# Patient Record
Sex: Male | Born: 1970
Health system: Southern US, Community
[De-identification: ages and names within clinical notes are randomized; demographics above are authoritative.]

## PROBLEM LIST (undated history)

## (undated) DIAGNOSIS — R011 Cardiac murmur, unspecified: Secondary | ICD-10-CM

## (undated) DIAGNOSIS — I10 Essential (primary) hypertension: Secondary | ICD-10-CM

## (undated) DIAGNOSIS — F32A Depression, unspecified: Secondary | ICD-10-CM

## (undated) DIAGNOSIS — G4730 Sleep apnea, unspecified: Secondary | ICD-10-CM

## (undated) DIAGNOSIS — E1110 Type 2 diabetes mellitus with ketoacidosis without coma: Secondary | ICD-10-CM

## (undated) DIAGNOSIS — U071 COVID-19: Secondary | ICD-10-CM

## (undated) DIAGNOSIS — F329 Major depressive disorder, single episode, unspecified: Secondary | ICD-10-CM

## (undated) DIAGNOSIS — E119 Type 2 diabetes mellitus without complications: Secondary | ICD-10-CM

## (undated) DIAGNOSIS — F419 Anxiety disorder, unspecified: Secondary | ICD-10-CM

## (undated) HISTORY — DX: Essential (primary) hypertension: I10

## (undated) HISTORY — DX: Type 2 diabetes mellitus without complications: E11.9

## (undated) HISTORY — DX: Type 2 diabetes mellitus with ketoacidosis without coma: E11.10

## (undated) HISTORY — DX: Anxiety disorder, unspecified: F41.9

## (undated) HISTORY — DX: Depression, unspecified: F32.A

## (undated) HISTORY — DX: Major depressive disorder, single episode, unspecified: F32.9

## (undated) HISTORY — DX: Sleep apnea, unspecified: G47.30

## (undated) HISTORY — PX: OTHER SURGICAL HISTORY: SHX169

## (undated) HISTORY — DX: COVID-19: U07.1

## (undated) HISTORY — DX: Cardiac murmur, unspecified: R01.1

---

## 2005-11-14 ENCOUNTER — Ambulatory Visit: Payer: Self-pay | Admitting: Family Medicine

## 2006-07-14 ENCOUNTER — Emergency Department: Payer: Self-pay | Admitting: Unknown Physician Specialty

## 2008-04-28 ENCOUNTER — Emergency Department: Payer: Self-pay | Admitting: Emergency Medicine

## 2009-01-08 ENCOUNTER — Emergency Department: Payer: Self-pay | Admitting: Emergency Medicine

## 2010-07-21 ENCOUNTER — Ambulatory Visit: Payer: Self-pay

## 2012-05-18 ENCOUNTER — Emergency Department: Payer: Self-pay | Admitting: Unknown Physician Specialty

## 2012-05-18 LAB — COMPREHENSIVE METABOLIC PANEL
Albumin: 4.1 g/dL (ref 3.4–5.0)
Alkaline Phosphatase: 61 U/L (ref 50–136)
Anion Gap: 8 (ref 7–16)
BUN: 15 mg/dL (ref 7–18)
Calcium, Total: 9 mg/dL (ref 8.5–10.1)
Glucose: 599 mg/dL (ref 65–99)
Potassium: 3.7 mmol/L (ref 3.5–5.1)
SGOT(AST): 22 U/L (ref 15–37)
Sodium: 129 mmol/L — ABNORMAL LOW (ref 136–145)
Total Protein: 8 g/dL (ref 6.4–8.2)

## 2012-05-18 LAB — URINALYSIS, COMPLETE
Bilirubin,UR: NEGATIVE
Glucose,UR: >=500 mg/dL (ref 0–75)
Nitrite: NEGATIVE
Ph: 6 (ref 4.5–8.0)
Protein: NEGATIVE
RBC,UR: <1 /HPF (ref 0–5)
WBC UR: <1 /HPF (ref 0–5)

## 2012-05-18 LAB — PLATELET COUNT: Platelet: 159 10*3/uL (ref 150–440)

## 2012-05-18 LAB — CBC
HCT: 41.5 % (ref 40.0–52.0)
HGB: 14.5 g/dL (ref 13.0–18.0)
MCH: 31.2 pg (ref 26.0–34.0)
RBC: 4.66 10*6/uL (ref 4.40–5.90)

## 2012-05-18 LAB — TROPONIN I: Troponin-I: <0.02 ng/mL

## 2012-05-18 LAB — MAGNESIUM: Magnesium: 1.8 mg/dL

## 2012-05-19 ENCOUNTER — Emergency Department: Payer: Self-pay | Admitting: Emergency Medicine

## 2012-05-19 LAB — CBC
HCT: 38.2 % — ABNORMAL LOW (ref 40.0–52.0)
HGB: 13.2 g/dL (ref 13.0–18.0)
MCH: 31.1 pg (ref 26.0–34.0)
MCHC: 34.6 g/dL (ref 32.0–36.0)
Platelet: 121 10*3/uL — ABNORMAL LOW (ref 150–440)
RBC: 4.25 10*6/uL — ABNORMAL LOW (ref 4.40–5.90)

## 2012-05-19 LAB — COMPREHENSIVE METABOLIC PANEL
Anion Gap: 11 (ref 7–16)
Calcium, Total: 8.6 mg/dL (ref 8.5–10.1)
Chloride: 100 mmol/L (ref 98–107)
EGFR (African American): >60
EGFR (Non-African Amer.): >60
Osmolality: 292 (ref 275–301)
Potassium: 3.5 mmol/L (ref 3.5–5.1)
SGOT(AST): 26 U/L (ref 15–37)
Sodium: 134 mmol/L — ABNORMAL LOW (ref 136–145)

## 2012-05-19 LAB — URINALYSIS, COMPLETE
Bacteria: NONE SEEN
Bilirubin,UR: NEGATIVE
Glucose,UR: >=500 mg/dL (ref 0–75)
Ph: 6 (ref 4.5–8.0)
RBC,UR: <1 /HPF (ref 0–5)
Squamous Epithelial: <1
WBC UR: <1 /HPF (ref 0–5)

## 2012-06-10 ENCOUNTER — Ambulatory Visit: Payer: Self-pay | Admitting: Family Medicine

## 2012-06-29 ENCOUNTER — Ambulatory Visit: Payer: Self-pay | Admitting: Family Medicine

## 2013-02-07 LAB — HEMOGLOBIN A1C: Hgb A1c MFr Bld: 12.9 % — AB (ref 4.0–6.0)

## 2013-06-14 LAB — HEPATIC FUNCTION PANEL
ALK PHOS: 63 U/L (ref 25–125)
ALT: 45 U/L — AB (ref 10–40)
AST: 29 U/L (ref 14–40)
BILIRUBIN, TOTAL: 0.3 mg/dL

## 2013-06-14 LAB — BASIC METABOLIC PANEL
BUN: 17 mg/dL (ref 4–21)
CREATININE: 1.1 mg/dL (ref 0.6–1.3)
Glucose: 370 mg/dL
POTASSIUM: 3.8 mmol/L (ref 3.4–5.3)
SODIUM: 136 mmol/L — AB (ref 137–147)

## 2013-06-14 LAB — HEMOGLOBIN A1C: Hgb A1c MFr Bld: 12.4 % — AB (ref 4.0–6.0)

## 2013-08-21 LAB — HEPATIC FUNCTION PANEL
ALK PHOS: 51 U/L (ref 25–125)
AST: 65 U/L — AB (ref 14–40)

## 2013-08-21 LAB — LIPID PANEL
CHOLESTEROL: 198 mg/dL (ref 0–200)
CHOLESTEROL: 198 mg/dL (ref 0–200)
HDL: 23 mg/dL — AB (ref 35–70)
Triglycerides: 999 mg/dL — AB (ref 40–160)

## 2013-08-21 LAB — BASIC METABOLIC PANEL
BUN: 18 mg/dL (ref 4–21)
Creatinine: 1 mg/dL (ref 0.6–1.3)
Glucose: 267 mg/dL
Potassium: 4.1 mmol/L (ref 3.4–5.3)
Sodium: 140 mmol/L (ref 137–147)

## 2013-08-21 LAB — HEMOGLOBIN A1C: HEMOGLOBIN A1C: 11.9 % — AB (ref 4.0–6.0)

## 2013-09-07 ENCOUNTER — Ambulatory Visit: Payer: BC Managed Care – PPO | Admitting: Endocrinology

## 2013-09-07 DIAGNOSIS — Z0289 Encounter for other administrative examinations: Secondary | ICD-10-CM

## 2014-04-21 LAB — BASIC METABOLIC PANEL
BUN: 11 mg/dL (ref 4–21)
Creatinine: 0.8 mg/dL (ref 0.6–1.3)
Glucose: 259 mg/dL
Potassium: 3.9 mmol/L (ref 3.4–5.3)
Sodium: 135 mmol/L — AB (ref 137–147)

## 2014-04-21 LAB — HEPATIC FUNCTION PANEL
ALT: 18 U/L (ref 10–40)
AST: 17 U/L (ref 14–40)
Alkaline Phosphatase: 57 U/L (ref 25–125)
Bilirubin, Total: 0.5 mg/dL

## 2014-04-21 LAB — LIPID PANEL
CHOLESTEROL: 184 mg/dL (ref 0–200)
HDL: 37 mg/dL (ref 35–70)
TRIGLYCERIDES: 481 mg/dL — AB (ref 40–160)

## 2014-05-02 ENCOUNTER — Ambulatory Visit (INDEPENDENT_AMBULATORY_CARE_PROVIDER_SITE_OTHER): Payer: BC Managed Care – PPO | Admitting: Endocrinology

## 2014-05-02 ENCOUNTER — Encounter: Payer: Self-pay | Admitting: *Deleted

## 2014-05-02 ENCOUNTER — Encounter: Payer: Self-pay | Admitting: Endocrinology

## 2014-05-02 VITALS — BP 118/82 | HR 80 | Ht 73.25 in | Wt 190.5 lb

## 2014-05-02 DIAGNOSIS — E1169 Type 2 diabetes mellitus with other specified complication: Secondary | ICD-10-CM | POA: Insufficient documentation

## 2014-05-02 DIAGNOSIS — E785 Hyperlipidemia, unspecified: Secondary | ICD-10-CM | POA: Insufficient documentation

## 2014-05-02 DIAGNOSIS — E119 Type 2 diabetes mellitus without complications: Secondary | ICD-10-CM

## 2014-05-02 DIAGNOSIS — I340 Nonrheumatic mitral (valve) insufficiency: Secondary | ICD-10-CM | POA: Insufficient documentation

## 2014-05-02 DIAGNOSIS — I10 Essential (primary) hypertension: Secondary | ICD-10-CM | POA: Insufficient documentation

## 2014-05-02 DIAGNOSIS — E1165 Type 2 diabetes mellitus with hyperglycemia: Secondary | ICD-10-CM | POA: Insufficient documentation

## 2014-05-02 HISTORY — DX: Essential (primary) hypertension: I10

## 2014-05-02 LAB — HM DIABETES FOOT EXAM: HM Diabetic Foot Exam: NORMAL

## 2014-05-02 MED ORDER — INSULIN GLARGINE 100 UNIT/ML SOLOSTAR PEN: 40.0000 [IU] | PEN_INJECTOR | Freq: Every day | SUBCUTANEOUS | Status: DC

## 2014-05-02 MED ORDER — GLUCOSE BLOOD VI STRP: ORAL_STRIP | Status: DC

## 2014-05-02 MED ORDER — INSULIN PEN NEEDLE 32G X 4 MM MISC: Status: DC

## 2014-05-02 MED ORDER — ONETOUCH DELICA LANCETS 33G MISC: Status: DC

## 2014-05-02 MED ORDER — INSULIN ASPART 100 UNIT/ML FLEXPEN: 15.0000 [IU] | PEN_INJECTOR | Freq: Three times a day (TID) | SUBCUTANEOUS | Status: DC

## 2014-05-02 NOTE — Assessment & Plan Note (Signed)
Blood pressure is at target today on current regimen. Recent urine microalbumin positive in October 2015 in the setting of diabetes mellitus.will reassess at future visits once glycemic control is better.   

## 2014-05-02 NOTE — Assessment & Plan Note (Signed)
Recent triglyceride levels were elevated in October 2015.expect improvement with better sugar control. Patient has prior history of noncompliance.

## 2014-05-02 NOTE — Progress Notes (Signed)
Reason for visit-  Craig KingfisherCalvin Johnson is a 43 y.o.-year-old male, referred by his PCP, Craig Johnson, for management of Type 2 diabetes, uncontrolled, with complications ( ? Stage 2 CKD, prior hx DKA).   HPI- Patient has been diagnosed with diabetes in 2013. Recalls being initially on lifestyle modifications.  Tried  Metformin,  Januvia, Farxiga in the past. Then was transitioned to basal/bolus insulin. Has been non compliant with medications and follow up. It appears from the office notes that he has been dismissed from their practice for non compliance in October 2015.  Prior therapies-  Tried metformin "but didn't work".  Did try Januvia -"didn't work". Marcelline DeistFarxiga was given - took for 6 months. Then ran out of RF and missed appts.    Pt is currently on a regimen of: - Lantus 60 units qhs- hasn't taken in over a month as out of medication - Novolog 20 units qac once daily every morning   Last hemoglobin A1c was: Lab Results  Component Value Date   HGBA1C 11.9* 08/21/2013   HGBA1C 12.4* 06/14/2013   HGBA1C 12.9* 02/07/2013     Pt checks his sugars 1 time a day . Uses  A glucometer (? Brand) . By recall they are:  PREMEAL Breakfast Lunch Dinner Bedtime Overall  Glucose range: 300s      Mean/median:        POST-MEAL PC Breakfast PC Lunch PC Dinner  Glucose range:     Mean/median:       Hypoglycemia-  No lows. he has hypoglycemia awareness at normal sugars in the 100s and reports that this happened a few times when he was using lantus at 60 units daily.   Dietary habits- eats 2 times daily. Skips lunch some times. Eats out at fast food joints often. Doesn't watch carbs. Drinks diet drinks.   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  Weight - trending down per his report with symptoms of polyuria and polydipsia Wt Readings from Last 3 Encounters:  05/02/14 190 lb 8 oz (86.41 kg)    Diabetes Complications-  Nephropathy- No, ? Stage 2    CKD, last BUN/creatinine- GFR 105 Lab Results  Component Value Date   BUN 11 04/21/2014   CREATININE 0.8 04/21/2014  Elevated urine MA results Dec 2014, October 2015  Retinopathy- No, Last DEE was  3 weeks ago in October 2015. Has glaucoma.  Neuropathy- occasional pins and needles sensation in his feet. No known neuropathy.  Associated history - No CAD . No prior stroke. No hypothyroidism. his last TSH was No results found for: TSH  Hyperlipidemia-  his last set of lipids were- Currently on dietary therapy. Tolerating well.   Lab Results  Component Value Date   CHOL 184 04/21/2014   HDL 37 04/21/2014   TRIG 481* 04/21/2014    Blood Pressure/HTN- Patient's blood pressure is well controlled today on current regimen that includes ACE-I.Marland Kitchen.  Pt has FH of DM in Father and Brother.  I have reviewed the patient's past medical history, family and social history, surgical history, medications and allergies.   Past Medical History  Diagnosis Date  . Diabetes mellitus without complication     Type 2  . Hypertension   . DKA (diabetic ketoacidoses)   . Anxiety   . Depression   . Sleep apnea    No past surgical history on file. History   Social History  . Marital Status: Married    Spouse Name: N/A  Number of Children: N/A  . Years of Education: N/A   Occupational History  . Not on file.   Social History Main Topics  . Smoking status: Never Smoker   . Smokeless tobacco: Never Used  . Alcohol Use: Yes  . Drug Use: No  . Sexual Activity: Not on file   Other Topics Concern  . Not on file   Social History Narrative  . No narrative on file   No current outpatient prescriptions on file prior to visit.   No current facility-administered medications on file prior to visit.   No Known Allergies Family History  Problem Relation Age of Onset  . Diabetes Father   . Diabetes Brother      Review of Systems: [x]  complains of  [  ] denies General:   [ x ] Recent weight  change [  ] Fatigue  [  ] Loss of appetite Eyes: [  ]  Vision Difficulty [  ]  Eye pain ENT: [  ]  Hearing difficulty [  ]  Difficulty Swallowing CVS: [  ] Chest pain [  ]  Palpitations/Irregular Heart beat [  ]  Shortness of breath lying flat [  ] Swelling of legs Resp: [  ] Frequent Cough [  ] Shortness of Breath  [  ]  Wheezing GI: [  ] Heartburn  [  ] Nausea or Vomiting  [  ] Diarrhea [  ] Constipation  [ x ] Abdominal Pain GU: [  ]  Polyuria  [ x ]  nocturia Bones/joints:  [  ]  Muscle aches  [  ] Joint Pain  [  ] Bone pain Skin/Hair/Nails: [  ]  Rash  [  ] New stretch marks [  ]  Itching [  ] Hair loss [  ]  Excessive hair growth Reproduction: [  ] Low sexual desire , [  ]  Women: Menstrual cycle problems [  ]  Women: Breast Discharge [  ] Men: Difficulty with erections [  ]  Men: Enlarged Breasts CNS: [  ] Frequent Headaches [  ] Blurry vision [  ] Tremors [  ] Seizures [  ] Loss of consciousness [  ] Localized weakness Endocrine: [ x ]  Excess thirst [  ]  Feeling excessively hot [  ]  Feeling excessively cold Heme: [  ]  Easy bruising [  ]  Enlarged glands or lumps in neck Allergy: [  ]  Food allergies [  ] Environmental allergies  PE: BP 118/82 mmHg  Pulse 80  Ht 6' 1.25" (1.861 m)  Wt 190 lb 8 oz (86.41 kg)  BMI 24.95 kg/m2  SpO2 98% Wt Readings from Last 3 Encounters:  05/02/14 190 lb 8 oz (86.41 kg)   GENERAL: No acute distress, well developed HEENT:  Eye exam shows normal external appearance. Oral exam shows normal mucosa .  NECK:   Neck exam shows no lymphadenopathy. No Carotids bruits. Thyroid is not enlarged and no nodules felt.  no acanthosis nigricans LUNGS:         Chest is symmetrical. Lungs are clear to auscultation.Marland Kitchen   HEART:         Heart sounds:  S1 and S2 are normal. Systolic murmur heard with some radiation to carotids. ABDOMEN:  No Distention present. Liver and spleen are not palpable. No other mass or tenderness present.  EXTREMITIES:     There is no edema.  2+ DP pulses  NEUROLOGICAL:     Grossly intact.            Diabetic foot exam done with shoes and socks removed: Normal Monofilament testing bilaterally. No deformities of toes.  Nails  Not dystrophic. Skin normal color. No open wounds. Dry skin.  MUSCULOSKELETAL:       There is no enlargement or gross deformity of the joints.  SKIN:       No rash  ASSESSMENT AND PLAN: Problem List Items Addressed This Visit      Cardiovascular and Mediastinum   Essential hypertension    Blood pressure is at target today on current regimen. Recent urine microalbumin positive in October 2015 in the setting of diabetes mellitus.will reassess at future visits once glycemic control is better.    Relevant Medications      amLODipine (NORVASC) 10 MG tablet      lisinopril-hydrochlorothiazide (PRINZIDE,ZESTORETIC) 20-25 MG per tablet     Endocrine   Type 2 diabetes mellitus not at goal - Primary    He states that his recent A1c is elevated to the 13% range. We discussed at length regarding importance of better glycemic control and goalA1c of close to 7%.discussed at length regarding the long-term risks of uncontrolled diabetes mellitus. The patient has been discharged from his primary care's practice due to noncompliance, and this was found out after review of the referral notes sent after the clinic visit. He will need to establish care with new primary care provider.  He seems motivated at this time with the recommendations that we discussed in clinic today.  Discussed dietary changes and modifications. Given him "nutrition in the fast lane" booklet so that he could make better choices. He will check his sugars 4 times a day. One Touch mini meter given with the testing supplies. He will record his sugars in a log and bring it to the next appointment in 2 weeks.  Discussed at length about basal bolus insulin regimen, and he will start Lantus 40 units a day at bedtime and take NovoLog 15 units with each meal. He  would likely need a higher dose based on his prior report, however, I would like to see his blood sugar report first in order to make these adjustments.  Foot care and eye exams. Discussed.  RTC 2 weeks.    Relevant Medications      lisinopril-hydrochlorothiazide (PRINZIDE,ZESTORETIC) 20-25 MG per tablet      insulin aspart (NOVOLOG FLEXPEN) 100 UNIT/ML FlexPen      LANTUS SOLOSTAR 100 UNIT/ML  SOLN     Other   Hyperlipidemia    Recent triglyceride levels were elevated in October 2015.expect improvement with better sugar control. Patient has prior history of noncompliance.    Relevant Medications      amLODipine (NORVASC) 10 MG tablet      lisinopril-hydrochlorothiazide (PRINZIDE,ZESTORETIC) 20-25 MG per tablet        - Return to clinic in 2 weeks with sugar log/meter.  Raydel [facility] 05/02/2014 11:35 AM

## 2014-05-02 NOTE — Assessment & Plan Note (Signed)
He states that his recent A1c is elevated to the 13% range. We discussed at length regarding importance of better glycemic control and goalA1c of close to 7%.discussed at length regarding the long-term risks of uncontrolled diabetes mellitus. The patient has been discharged from his primary care's practice due to noncompliance, and this was found out after review of the referral notes sent after the clinic visit. He will need to establish care with new primary care provider.  He seems motivated at this time with the recommendations that we discussed in clinic today.  Discussed dietary changes and modifications. Given him "nutrition in the fast lane" booklet so that he could make better choices. He will check his sugars 4 times a day. One Touch mini meter given with the testing supplies. He will record his sugars in a log and bring it to the next appointment in 2 weeks.  Discussed at length about basal bolus insulin regimen, and he will start Lantus 40 units a day at bedtime and take NovoLog 15 units with each meal. He would likely need a higher dose based on his prior report, however, I would like to see his blood sugar report first in order to make these adjustments.  Foot care and eye exams. Discussed.  RTC 2 weeks.

## 2014-05-02 NOTE — Patient Instructions (Signed)
Work on better [employer]ation[employer]ietary choices.   Check sugars 4 x daily ( before each meal and at bedtime).  Record them in a log book and bring that/meter to next appointment.   Change Novolog to 15 units prior to each meal. Dont take that particular shot of Novolog if you are going to skip a meal.   Start lantus 40 units at night time daily.   Please come back for a follow-up appointment in 2 weeks

## 2014-05-02 NOTE — Progress Notes (Signed)
Pre visit review using our clinic review tool, if applicable. No additional management support is needed unless otherwise documented below in the visit note. 

## 2014-05-16 ENCOUNTER — Ambulatory Visit: Payer: BC Managed Care – PPO | Admitting: Endocrinology

## 2014-05-16 ENCOUNTER — Ambulatory Visit (INDEPENDENT_AMBULATORY_CARE_PROVIDER_SITE_OTHER): Payer: BC Managed Care – PPO | Admitting: Endocrinology

## 2014-05-16 ENCOUNTER — Encounter: Payer: Self-pay | Admitting: Endocrinology

## 2014-05-16 VITALS — BP 122/80 | HR 77 | Wt 191.5 lb

## 2014-05-16 DIAGNOSIS — E785 Hyperlipidemia, unspecified: Secondary | ICD-10-CM

## 2014-05-16 DIAGNOSIS — I10 Essential (primary) hypertension: Secondary | ICD-10-CM

## 2014-05-16 DIAGNOSIS — E119 Type 2 diabetes mellitus without complications: Secondary | ICD-10-CM

## 2014-05-16 LAB — HEMOGLOBIN A1C: Hgb A1c MFr Bld: 12.3 % — ABNORMAL HIGH (ref 4.6–6.5)

## 2014-05-16 MED ORDER — FENOFIBRATE 54 MG PO TABS: 54.0000 mg | ORAL_TABLET | Freq: Every day | ORAL | Status: DC

## 2014-05-16 NOTE — Patient Instructions (Signed)
Check sugars 4 x daily ( before each meal and at bedtime).  Record them in a log book and bring that/meter to next appointment.   Change lantus to 42 units daily at bedtime.  Change Novolog to 15+1 scale detailed below.  Avoid alcohol.  Start fenofibrate therapy for elevated Tgs.  Labs today for A1c.  Please come back for a follow-up appointment in 1 month.   Blood Glucose (mg/dL)  Breakfast  (Units Novolog Insulin)  Lunch  (Units Novolog Insulin)  Supper  (Units Novolog Insulin)  Nighttime (Units Novolog Insulin)   <70 Treat the low blood sugar.  Recheck blood glucose  in 15 mins. If sugar is more than 70, then take the number of units of insulin in the 70-90 row, if before a meal.   70-90   9   9   9   0  91-130 (Base Dose)  15  15  15   0   131-150  16  16  16   0   151-200  17  17  17   0   201-250  18  18  18  0   251-300  19  19  19 1    301-350  20  20  20 2    351-400  21  21  21  3    401-450  22  22  22 4    >450  23  23  23  5    Lantus insulin 42 units subcutaneous Nightly

## 2014-05-16 NOTE — Assessment & Plan Note (Signed)
Blood pressure is at target today on current regimen. Recent urine microalbumin positive in October 2015 in the setting of diabetes mellitus.will reassess at future visits once glycemic control is better.

## 2014-05-16 NOTE — Progress Notes (Signed)
Pre visit review using our clinic review tool, if applicable. No additional management support is needed unless otherwise documented below in the visit note. 

## 2014-05-16 NOTE — Progress Notes (Signed)
Reason for visit-  Craig Johnson is a 43 y.o.-year-old male, here for follow up management of Type 2 diabetes, uncontrolled, with complications ( ? Stage 2 CKD, prior hx DKA). Last seen by me 2 weeks ago. Needs a new PCP.    HPI- Patient has been diagnosed with diabetes in 2013. Recalls being initially on lifestyle modifications.  Tried  Metformin,  Januvia, Farxiga in the past. Then was transitioned to basal/bolus insulin. Has been non compliant with medications and follow up. Dismissed from the [facility] practice for non compliance in October 2015.  Prior therapies-  Tried metformin "but didn't work".  Did try Januvia -"didn't work". Marcelline DeistFarxiga was given - took for 6 months. Then ran out of RF and missed appts.    Pt is currently on a regimen of: - Lantus 40 units qhs-misses about 40% time as he sleeps earlier at night time. - Novolog 15 units qac - missing lunch time shot still as often skips lunch   Last hemoglobin A1c was: Lab Results  Component Value Date   HGBA1C 11.9* 08/21/2013   HGBA1C 12.4* 06/14/2013   HGBA1C 12.9* 02/07/2013     Pt checks his sugars 3-4 times a day . Uses One Touch mini glucometer. By meter download they are:  PREMEAL Breakfast Lunch Dinner Bedtime Overall  Glucose range: 145-227 86-312 241-326 223-354   Mean/median:        POST-MEAL PC Breakfast PC Lunch PC Dinner  Glucose range:     Mean/median:       Hypoglycemia-  No lows. he has hypoglycemia awareness at normal sugars in the 100s and had one recent low recently.   Dietary habits- eats 2 times daily. Skips lunch some times. Eats out at fast food joints often. Has been eating more vegetables recently, starting to watch carbs. Drinks diet drinks.   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  Weight - better symptoms recently, weight stable  Wt Readings from Last 3 Encounters:  05/16/14 191 lb 8 oz (86.864 kg)  05/02/14 190 lb 8 oz (86.41 kg)     Diabetes Complications-  Nephropathy- No, ? Stage 2   CKD, last BUN/creatinine- GFR 105 Lab Results  Component Value Date   BUN 11 04/21/2014   CREATININE 0.8 04/21/2014  Elevated urine MA results Dec 2014, October 2015  Retinopathy- No, Last DEE was  in October 2015. Has glaucoma.  Neuropathy- occasional pins and needles sensation in his feet. No known neuropathy.  Associated history - No CAD . No prior stroke. No hypothyroidism. his last TSH was No results found for: TSH  Hyperlipidemia-  his last set of lipids were- Currently on dietary therapy. Tolerating well.   Lab Results  Component Value Date   CHOL 184 04/21/2014   HDL 37 04/21/2014   TRIG 481* 04/21/2014    Blood Pressure/HTN- Patient's blood pressure is well controlled today on current regimen that includes ACE-I..   I have reviewed the patient's past medical history, medications and allergies.      Current Outpatient Prescriptions on File Prior to Visit  Medication Sig Dispense Refill  . amLODipine (NORVASC) 10 MG tablet Take 10 mg by mouth daily.     Marland Kitchen. glucose blood (ONE TOUCH ULTRA TEST) test strip Use to test fingerstick glucose 4 times daily 120 each 12  . insulin aspart (NOVOLOG FLEXPEN) 100 UNIT/ML FlexPen Inject 15 Units into the skin 3 (three) times daily with meals. 15 mL 3  .  Insulin Glargine (LANTUS SOLOSTAR) 100 UNIT/ML Solostar Pen Inject 40 Units into the skin daily at 10 pm. 5 pen 4  . Insulin Pen Needle (BD PEN NEEDLE NANO U/F) 32G X 4 MM MISC Use for insulin injections 4 times daily. 120 each 11  . lisinopril-hydrochlorothiazide (PRINZIDE,ZESTORETIC) 20-25 MG per tablet Take 1 tablet by mouth daily.   0  . ONETOUCH DELICA LANCETS 33G MISC Use for checking fingerstick glucose 4 times daily 120 each 11   No current facility-administered medications on file prior to visit.   No Known Allergies    Review of Systems- [ x ]  Complains of    [  ]  denies [  ] Recent weight change [  ]  Fatigue  [  ] polydipsia [  ] polyuria [  ]  nocturia [  ]  vision difficulty [  ] chest pain [  ] shortness of breath [  ] leg swelling [  ] cough [  ] nausea/vomiting [  ] diarrhea [  ] constipation [  ] abdominal pain [  ]  tingling/numbness in extremities [  ]  concern with feet ( wounds/sores)   PE: BP 122/80 mmHg  Pulse 77  Wt 191 lb 8 oz (86.864 kg)  SpO2 97% Wt Readings from Last 3 Encounters:  05/16/14 191 lb 8 oz (86.864 kg)  05/02/14 190 lb 8 oz (86.41 kg)   Exam: deferred  ASSESSMENT AND PLAN: Problem List Items Addressed This Visit      Cardiovascular and Mediastinum   Essential hypertension    Blood pressure is at target today on current regimen. Recent urine microalbumin positive in October 2015 in the setting of diabetes mellitus.will reassess at future visits once glycemic control is better.      Relevant Medications      fenofibrate tablet     Endocrine   Type 2 diabetes mellitus not at goal - Primary    Update A1c at this visit, as not done recently.  Will set him up with new PCP as was dismissed from last practice.   He seems motivated at this time with the recommendations that we discussed in clinic today and at last visit.   He will continue to work on his diet and exercise.  He will check his sugars 4 times a day.  Change lantus to 42 units  daily at bedtime.  Change Novolog to 15+1 scale qac. He was encouraged to have regular lunch and take the Novolog per scale. Asked him to move his lantus dosing to 8 pm so that he is not missing doses as much.     RTC 4 weeks.      Relevant Orders      Hemoglobin A1c     Other   Hyperlipidemia    Recent triglyceride levels were elevated in October 2015.expect improvement with better sugar control. Patient has prior history of noncompliance. Discussed low fat diet and avoiding all alcohol intake.  Discussed starting low dose fibrate therapy to lower TGs levels. He is agreeable. Risk profile and common side  effects discussed with patient. Start Fenofibrate 54 mg daily.       Relevant Medications      fenofibrate tablet        - Return to clinic in 4 weeks with sugar log/meter.  Kerline [facility] 05/16/2014 2:55 PM

## 2014-05-16 NOTE — Assessment & Plan Note (Signed)
Recent triglyceride levels were elevated in October 2015.expect improvement with better sugar control. Patient has prior history of noncompliance. Discussed low fat diet and avoiding all alcohol intake.  Discussed starting low dose fibrate therapy to lower TGs levels. He is agreeable. Risk profile and common side effects discussed with patient. Start Fenofibrate 54 mg daily.

## 2014-05-16 NOTE — Assessment & Plan Note (Signed)
Update A1c at this visit, as not done recently.  Will set him up with new PCP as was dismissed from last practice.   He seems motivated at this time with the recommendations that we discussed in clinic today and at last visit.   He will continue to work on his diet and exercise.  He will check his sugars 4 times a day.  Change lantus to 42 units [place] daily at bedtime.  Change Novolog to 15+1 scale qac. He was encouraged to have regular lunch and take the Novolog per scale. Asked him to move his lantus dosing to 8 pm so that he is not missing doses as much.     RTC 4 weeks.

## 2014-05-17 ENCOUNTER — Telehealth: Payer: Self-pay

## 2014-05-17 NOTE — Telephone Encounter (Signed)
Spoke to patient to notified him of his lab results and Dr. Ephriam JenkinsPhadke's comments. Patient verbalized understanding. Letter mailed with results attached per patient request.

## 2014-05-17 NOTE — Telephone Encounter (Signed)
The patient called and is hoping to speak with you. He did not say what the call was about (i apologize for not getting more information)

## 2014-06-06 ENCOUNTER — Other Ambulatory Visit: Payer: Self-pay | Admitting: *Deleted

## 2014-06-06 MED ORDER — LISINOPRIL-HYDROCHLOROTHIAZIDE 20-25 MG PO TABS: 1.0000 | ORAL_TABLET | Freq: Every day | ORAL | Status: DC

## 2014-06-13 ENCOUNTER — Encounter: Payer: Self-pay | Admitting: Nurse Practitioner

## 2014-06-13 ENCOUNTER — Ambulatory Visit (INDEPENDENT_AMBULATORY_CARE_PROVIDER_SITE_OTHER): Payer: BC Managed Care – PPO | Admitting: Endocrinology

## 2014-06-13 ENCOUNTER — Encounter: Payer: Self-pay | Admitting: Endocrinology

## 2014-06-13 ENCOUNTER — Encounter (INDEPENDENT_AMBULATORY_CARE_PROVIDER_SITE_OTHER): Payer: Self-pay

## 2014-06-13 ENCOUNTER — Ambulatory Visit (INDEPENDENT_AMBULATORY_CARE_PROVIDER_SITE_OTHER): Payer: BC Managed Care – PPO | Admitting: Nurse Practitioner

## 2014-06-13 VITALS — BP 144/92 | HR 83 | Temp 98.4°F | Resp 12 | Ht 73.25 in | Wt 204.0 lb

## 2014-06-13 VITALS — BP 144/92 | HR 83 | Ht 73.25 in | Wt 204.8 lb

## 2014-06-13 DIAGNOSIS — Z7689 Persons encountering health services in other specified circumstances: Secondary | ICD-10-CM

## 2014-06-13 DIAGNOSIS — E785 Hyperlipidemia, unspecified: Secondary | ICD-10-CM

## 2014-06-13 DIAGNOSIS — Z7189 Other specified counseling: Secondary | ICD-10-CM

## 2014-06-13 DIAGNOSIS — I340 Nonrheumatic mitral (valve) insufficiency: Secondary | ICD-10-CM

## 2014-06-13 DIAGNOSIS — I10 Essential (primary) hypertension: Secondary | ICD-10-CM

## 2014-06-13 DIAGNOSIS — E119 Type 2 diabetes mellitus without complications: Secondary | ICD-10-CM

## 2014-06-13 NOTE — Progress Notes (Signed)
Reason for visit-  Craig Johnson is a 43 y.o.-year-old male, here for follow up management of Type 2 diabetes, uncontrolled, with complications ( ? Stage 2 CKD, prior hx DKA). Last seen by me 4 weeks ago. Needs a new PCP- going to see Craig Johnson today.    HPI- Patient has been diagnosed with diabetes in 2013. Recalls being initially on lifestyle modifications.  Tried  Metformin,  Januvia, Farxiga in the past. Then was transitioned to basal/bolus insulin. Has been non compliant with medications and follow up. Dismissed from the [facility] practice for non compliance in October 2015.  Prior therapies-  Tried metformin "but didn't work".  Did try Januvia -"didn't work". Craig Johnson was given - took for 6 months. Then ran out of RF and missed appts.    Pt is currently on a regimen of: - Lantus 42 units qhs- taking it at supper now- improved compliance - Novolog 15+1 units qac - eating lunch now about 3/4 times and takes his insulin then   Last hemoglobin A1c was: Lab Results  Component Value Date   HGBA1C 12.3* 05/16/2014   HGBA1C 11.9* 08/21/2013   HGBA1C 12.4* 06/14/2013     Pt checks his sugars 1-3 times a day . Uses One Touch mini glucometer. By meter download they are:  PREMEAL Breakfast Lunch Dinner Bedtime Overall  Glucose range: 135-231 111-246 193-296    Mean/median:        POST-MEAL PC Breakfast PC Lunch PC Dinner  Glucose range:     Mean/median:       Hypoglycemia-  One recent low of 71 that woke him up from his sleep. Missed supper that day.  he has hypoglycemia awareness at normal sugars in the 100s.   Dietary habits- eats 2-3 times daily. Skips lunch some times. Eats out at fast food joints often. Has been eating more vegetables recently, starting to watch carbs. Drinks diet drinks.   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  Weight - better symptoms recently, weight increasing Wt Readings from Last 3 Encounters:  06/13/14  204 lb (92.534 kg)  06/13/14 204 lb 12 oz (92.874 kg)  05/16/14 191 lb 8 oz (86.864 kg)    Diabetes Complications-  Nephropathy- No, ? Stage 2   CKD, last BUN/creatinine- GFR 105 Lab Results  Component Value Date   BUN 11 04/21/2014   CREATININE 0.8 04/21/2014  Elevated urine MA results Dec 2014, October 2015  Retinopathy- No, Last DEE was  in October 2015. Has glaucoma.  Neuropathy- occasional pins and needles sensation in his feet. No known neuropathy.  Associated history - No CAD . No prior stroke. No hypothyroidism. his last TSH was No results found for: TSH  Hyperlipidemia-  his last set of lipids were- Currently on dietary therapy and started on fenofibrate 54 mg daily at last visit. Tolerating well.  Not fasting today.  Lab Results  Component Value Date   CHOL 184 04/21/2014   HDL 37 04/21/2014   TRIG 481* 04/21/2014    Blood Pressure/HTN- Patient's blood pressure is usually well controlled today on current regimen that includes ACE-I..Ran out of medication 1 week ago. Rx was sent by our office few days back, but patient didn't realize that the script is ready for pick up.    I have reviewed the patient's past medical history, medications and allergies.      Current Outpatient Prescriptions on File Prior to Visit  Medication Sig Dispense Refill  .  amLODipine (NORVASC) 10 MG tablet Take 10 mg by mouth daily.     . fenofibrate 54 MG tablet Take 1 tablet (54 mg total) by mouth daily. 30 tablet 4  . glucose blood (ONE TOUCH ULTRA TEST) test strip Use to test fingerstick glucose 4 times daily 120 each 12  . insulin aspart (NOVOLOG FLEXPEN) 100 UNIT/ML FlexPen Inject 15 Units into the skin 3 (three) times daily with meals. 15 mL 3  . Insulin Glargine (LANTUS SOLOSTAR) 100 UNIT/ML Solostar Pen Inject 40 Units into the skin daily at 10 pm. 5 pen 4  . Insulin Pen Needle (BD PEN NEEDLE NANO U/F) 32G X 4 MM MISC Use for insulin injections 4 times daily. 120 each 11  . ONETOUCH  DELICA LANCETS 33G MISC Use for checking fingerstick glucose 4 times daily 120 each 11  . lisinopril-hydrochlorothiazide (PRINZIDE,ZESTORETIC) 20-25 MG per tablet Take 1 tablet by mouth daily. (Patient not taking: Reported on 06/13/2014) 30 tablet 6   No current facility-administered medications on file prior to visit.   No Known Allergies    Review of Systems- [ x ]  Complains of    [  ]  denies [  ] Recent weight change [  ]  Fatigue [  ] polydipsia [  ] polyuria [  ]  nocturia [  ]  vision difficulty [  ] chest pain [  ] shortness of breath [  ] leg swelling [  ] cough [  ] nausea/vomiting [  ] diarrhea [  ] constipation [  ] abdominal pain [  ]  tingling/numbness in extremities [  ]  concern with feet ( wounds/sores)   PE: BP 144/92 mmHg  Pulse 83  Ht 6' 1.25" (1.861 m)  Wt 204 lb 12 oz (92.874 kg)  BMI 26.82 kg/m2  SpO2 97% Wt Readings from Last 3 Encounters:  06/13/14 204 lb (92.534 kg)  06/13/14 204 lb 12 oz (92.874 kg)  05/16/14 191 lb 8 oz (86.864 kg)   Exam: deferred  ASSESSMENT AND PLAN: Problem List Items Addressed This Visit      Cardiovascular and Mediastinum   Essential hypertension    Blood pressure is not at target today -since he hasn't taken med for past week. Asked him to restart medication. Urine microalbumin positive in October 2015 in the setting of uncontrolled diabetes mellitus.will reassess at future visits once glycemic control is better.          Endocrine   Type 2 diabetes mellitus not at goal - Primary    Recent A1c not controlled. Some improvement in checking frequency and sugars, on which he was congratulated.    He will continue to work on his diet and exercise.  He will check his sugars 4 times a day- improve compliance with lunch time and bedtime checks.  Continue lantus at 42 units [place] daily at bedtime.  Change Novolog to 17+1 scale with BF and continue 15+1 scale with lunch and supper. ( need more data at these time points to make  insulin adjustments) He was encouraged to have regular lunch and take the Novolog per scale.     RTC 6 weeks.          Other   Hyperlipidemia    Recent triglyceride levels were elevated in October 2015.expect improvement with better sugar control. Patient has prior history of noncompliance. Discussed low fat diet and avoiding all alcohol intake at prior visits.  continue Fenofibrate 54 mg daily. Recheck fasting  levels at next visit.              - Return to clinic in 6 weeks with sugar log/meter.  Craig Johnson [facility] 06/13/2014 4:30 PM

## 2014-06-13 NOTE — Progress Notes (Addendum)
Subjective:    Patient ID: Craig Johnson, male    DOB: 03-24-1971, 43 y.o.   MRN: [id number]  HPI  Craig Johnson is a 43 yo male here to establish primary care and for follow up of health maintenance, HTN, MVR, hyperlipidemia, and sleep apnea.   CC: HTN   1) Health Maintenance-   Diet- Tries to skip fast food, looks for lower carb options, watches sodium  Exercise- walks around work, would like to start at a gym after the new year   Immunizations- 10/15 flu vaccine, will get records from [facility]  Colonoscopy- Not of age  PSA- No issues at this time   Eye Exam- DEE 10/15, Glaucoma Left eye, drops at night  Dental Exam- [place], 2/15, goes every 6 mos  2) Chronic Problems- HTN- Alodipine 10 mg, Lisinopril-Hctz   Mitral Valve Regurgitation- Heart specialist 1 x years ago, states they told him he had a heart murmur and then he hasn't been back since  Type II DM- Sees Dr. Welford RochePhadke   Hyperlipidemia- Fenofibrate 54 mg tablet   Sleep Apnea- Snoring and waking self up with apneic events. Wore CPAP in past, hasn't in last 3 years, will get records   3) Acute Problems- Denies   Denies needing refills    Review of Systems  Constitutional: Negative for fever, chills, diaphoresis, activity change, appetite change, fatigue and unexpected weight change.  HENT: Negative for congestion and trouble swallowing.   Eyes: Negative for visual disturbance.       Glaucoma in Left eye- up to date on exam   Respiratory: Negative for cough, chest tightness and wheezing.   Cardiovascular: Negative for chest pain, palpitations and leg swelling.  Gastrointestinal: Negative for nausea, vomiting, abdominal pain, diarrhea and constipation.  Genitourinary: Negative for dysuria.  Skin: Negative for rash.  Neurological: Negative for dizziness, seizures, syncope, weakness and headaches.  Hematological: Does not bruise/bleed easily.   Past Medical History  Diagnosis Date  . Diabetes mellitus without  complication     Type 2  . Hypertension   . DKA (diabetic ketoacidoses)   . Anxiety   . Depression   . Sleep apnea   . Heart murmur     History   Social History  . Marital Status: Married    Spouse Name: N/A    Number of Children: N/A  . Years of Education: N/A   Occupational History  . Not on file.   Social History Main Topics  . Smoking status: Never Smoker   . Smokeless tobacco: Never Used  . Alcohol Use: Yes  . Drug Use: No  . Sexual Activity: Not on file   Other Topics Concern  . Not on file   Social History Narrative    History reviewed. No pertinent past surgical history.  Family History  Problem Relation Age of Onset  . Diabetes Father   . Hypertension Father   . Diabetes Brother   . Hypertension Mother   . Hypertension Maternal Grandmother     No Known Allergies  Current Outpatient Prescriptions on File Prior to Visit  Medication Sig Dispense Refill  . amLODipine (NORVASC) 10 MG tablet Take 10 mg by mouth daily.     . fenofibrate 54 MG tablet Take 1 tablet (54 mg total) by mouth daily. 30 tablet 4  . glucose blood (ONE TOUCH ULTRA TEST) test strip Use to test fingerstick glucose 4 times daily 120 each 12  . insulin aspart (NOVOLOG FLEXPEN) 100 UNIT/ML FlexPen Inject 15  Units into the skin 3 (three) times daily with meals. 15 mL 3  . Insulin Glargine (LANTUS SOLOSTAR) 100 UNIT/ML Solostar Pen Inject 40 Units into the skin daily at 10 pm. 5 pen 4  . Insulin Pen Needle (BD PEN NEEDLE NANO U/F) 32G X 4 MM MISC Use for insulin injections 4 times daily. 120 each 11  . lisinopril-hydrochlorothiazide (PRINZIDE,ZESTORETIC) 20-25 MG per tablet Take 1 tablet by mouth daily. (Patient not taking: Reported on 06/13/2014) 30 tablet 6  . ONETOUCH DELICA LANCETS 33G MISC Use for checking fingerstick glucose 4 times daily 120 each 11   No current facility-administered medications on file prior to visit.       Objective:   Physical Exam  Constitutional: He is  oriented to person, place, and time. He appears well-developed and well-nourished. No distress.  HENT:  Head: Normocephalic and atraumatic.  Right Ear: External ear normal.  Left Ear: External ear normal.  Mouth/Throat: No oropharyngeal exudate.  Cerumen in canals. TM's clear bilat  Eyes: Conjunctivae and EOM are normal. Pupils are equal, round, and reactive to light. Right eye exhibits no discharge. Left eye exhibits no discharge. No scleral icterus.  Contacts bilat  Neck: Normal range of motion. Neck supple. No thyromegaly present.  Cardiovascular: Normal rate and regular rhythm.   Murmur heard. Pulmonary/Chest: Effort normal and breath sounds normal. No respiratory distress. He has no wheezes. He has no rales. He exhibits no tenderness.  Abdominal: Soft. Bowel sounds are normal. He exhibits no distension and no mass. There is no tenderness. There is no rebound and no guarding.  Musculoskeletal: Normal range of motion. He exhibits no edema or tenderness.  Lymphadenopathy:    He has no cervical adenopathy.  Neurological: He is alert and oriented to person, place, and time. He displays normal reflexes. No cranial nerve deficit. He exhibits normal muscle tone. Coordination normal.  Skin: Skin is warm and dry. No rash noted. He is not diaphoretic.  Psychiatric: He has a normal mood and affect. His behavior is normal. Judgment and thought content normal.    BP 144/92 mmHg  Pulse 83  Temp(Src) 98.4 F (36.9 C)  Resp 12  Ht 6' 1.25" (1.861 m)  Wt 204 lb (92.534 kg)  BMI 26.72 kg/m2  SpO2 97%     Assessment & Plan:

## 2014-06-13 NOTE — Assessment & Plan Note (Signed)
Recent A1c not controlled. Some improvement in checking frequency and sugars, on which he was congratulated.    He will continue to work on his diet and exercise.  He will check his sugars 4 times a day- improve compliance with lunch time and bedtime checks.  Continue lantus at 42 units [place] daily at bedtime.  Change Novolog to 17+1 scale with BF and continue 15+1 scale with lunch and supper. ( need more data at these time points to make insulin adjustments) He was encouraged to have regular lunch and take the Novolog per scale.     RTC 6 weeks.

## 2014-06-13 NOTE — Assessment & Plan Note (Signed)
Blood pressure is not at target today -since he hasn't taken med for past week. Asked him to restart medication. Urine microalbumin positive in October 2015 in the setting of uncontrolled diabetes mellitus.will reassess at future visits once glycemic control is better.

## 2014-06-13 NOTE — Progress Notes (Signed)
Pre visit review using our clinic review tool, if applicable. No additional management support is needed unless otherwise documented below in the visit note. 

## 2014-06-13 NOTE — Assessment & Plan Note (Signed)
Recent triglyceride levels were elevated in October 2015.expect improvement with better sugar control. Patient has prior history of noncompliance. Discussed low fat diet and avoiding all alcohol intake at prior visits.  continue Fenofibrate 54 mg daily. Recheck fasting levels at next visit.

## 2014-06-13 NOTE — Patient Instructions (Signed)
Check sugars 4 x daily ( before each meal and at bedtime).  Record them in a log book and bring that/meter to next appointment.    Blood Glucose (mg/dL)  Breakfast  (Units Novolog Insulin)  Lunch  (Units Novolog Insulin)  Supper  (Units Novolog Insulin)  Nighttime (Units Novolog Insulin)   <70 Treat the low blood sugar.  Recheck blood glucose  in 15 mins. If sugar is more than 70, then take the number of units of insulin in the 70-90 row, if before a meal.   70-90  10   9   9   0  91-130 (Base Dose)  17  15  15   0   131-150  18  16  16   0   151-200  19  17  17   0   201-250  20  18  18  0   251-300  21  19  19 1    301-350  22  20  20 2    351-400  23  21  21  3    401-450  24  22  22 4    >450  25  23  23  5    Lantus insulin 42 units subcutaneous Nightly     Please come back for a follow-up appointment in 6 weeks

## 2014-06-13 NOTE — Patient Instructions (Signed)
We will see you when you need us.   Happy Holidays!

## 2014-06-26 ENCOUNTER — Other Ambulatory Visit: Payer: Self-pay

## 2014-06-26 MED ORDER — AMLODIPINE BESYLATE 10 MG PO TABS: 10.0000 mg | ORAL_TABLET | Freq: Every day | ORAL | Status: DC

## 2014-07-04 NOTE — Assessment & Plan Note (Signed)
Pt was seen and treated earlier by Dr. Welford RochePhadke for diabetes. BP is wosening. Will continue to follow and add today Norvasc 10 mg tablet daily to routine. Will follow up 6 weeks.

## 2014-07-04 NOTE — Assessment & Plan Note (Signed)
Pt needs to follow up with Cardiology since it has been over a year. Stable murmur.

## 2014-07-04 NOTE — Assessment & Plan Note (Signed)
Pt is being seen for care by Dr. Welford RochePhadke. Pt is uncontrolled. Re-iterated what was told to pt per Dr. Welford RochePhadke. Pt verbalized understanding. Will f/u in 6 weeks.

## 2014-07-06 NOTE — Addendum Note (Signed)
Addended by: Carollee LeitzSS, Marlow Berenguer M on: 07/06/2014 07:26 AM   Modules accepted: Level of Service

## 2014-07-25 ENCOUNTER — Ambulatory Visit (INDEPENDENT_AMBULATORY_CARE_PROVIDER_SITE_OTHER): Payer: Federal, State, Local not specified - PPO | Admitting: Endocrinology

## 2014-07-25 ENCOUNTER — Encounter: Payer: Self-pay | Admitting: Endocrinology

## 2014-07-25 VITALS — BP 132/88 | HR 77 | Ht 73.25 in | Wt 211.5 lb

## 2014-07-25 DIAGNOSIS — E785 Hyperlipidemia, unspecified: Secondary | ICD-10-CM

## 2014-07-25 DIAGNOSIS — E119 Type 2 diabetes mellitus without complications: Secondary | ICD-10-CM

## 2014-07-25 DIAGNOSIS — I10 Essential (primary) hypertension: Secondary | ICD-10-CM

## 2014-07-25 MED ORDER — GLUCOSE BLOOD VI STRP: ORAL_STRIP | Status: DC

## 2014-07-25 MED ORDER — INSULIN ASPART 100 UNIT/ML FLEXPEN: PEN_INJECTOR | SUBCUTANEOUS | Status: DC

## 2014-07-25 MED ORDER — INSULIN PEN NEEDLE 32G X 4 MM MISC: Status: DC

## 2014-07-25 MED ORDER — INSULIN GLARGINE 100 UNIT/ML SOLOSTAR PEN: 42.0000 [IU] | PEN_INJECTOR | Freq: Every day | SUBCUTANEOUS | Status: DC

## 2014-07-25 NOTE — Assessment & Plan Note (Signed)
Recent A1c not controlled. Some improvement in checking frequency and sugars, on which he was congratulated. Slightly early to update A1c.- will do so at next visit.    He will continue to work on his diet and exercise.  He will check his sugars 4 times a day- improve compliance with lunch time and bedtime checks.  Continue lantus at 42 units [place] daily at bedtime.  Change Novolog to 20+1 scale with BF and change to 18+1 scale with supper, and start taking a fixed dose of 15 units at lunch time )(if having lunch).  At next visit, could try introducing SGLT2 inhibitor and wean off rapid acting insulin.  Explained importance of staying on insulin therapy at this time, given his uncontrolled sugars.  He was given information on alternative insulin injection sites, and he reports that with above suggestions he will improve his compliance.      RTC 3 weeks.

## 2014-07-25 NOTE — Progress Notes (Signed)
Reason for visit-  Craig Johnson is a 44 y.o.-year-old male, here for follow up management of Type 2 diabetes, uncontrolled, with complications ( ? Stage 2 CKD, prior hx DKA). Last seen by me 6 weeks ago.    HPI- Patient has been diagnosed with diabetes in 2013. Recalls being initially on lifestyle modifications.  Tried  Metformin,  Januvia, Farxiga in the past. Then was transitioned to basal/bolus insulin. Has been non compliant with medications and follow up. Dismissed from the [facility] practice for non compliance in October 2015.  Prior therapies-  *Tried metformin "but didn't work".   *Did try Januvia -"didn't work".  *Marcelline DeistFarxiga was given - took for 6 months. Then ran out of RF and missed appts.    Pt is currently on a regimen of: - Lantus 42 units qhs- taking it at supper now- improved compliance- now forgetting 1-2/week - Novolog  17/15/15+1 units qac - still struggling with eating lunch and taking insulin per chart at that time. Needing around 20 units at BF and supper Also doesn't like injecting himself with insulin and doesn't like using his abdomen for injections   Last hemoglobin A1c was: Lab Results  Component Value Date   HGBA1C 12.3* 05/16/2014   HGBA1C 11.9* 08/21/2013   HGBA1C 12.4* 06/14/2013     Pt checks his sugars 1-3 times a day . Uses One Touch mini glucometer. By meter download they are:  PREMEAL Breakfast Lunch Dinner Bedtime Overall  Glucose range: 161-256 162-298 178-271 337   Mean/median:        POST-MEAL PC Breakfast PC Lunch PC Dinner  Glucose range:     Mean/median:       Hypoglycemia-  No recent lows.  he has hypoglycemia awareness at normal sugars in the 100s.   Dietary habits- eats 2-3 times daily. Skips lunch some times.  Has been eating more vegetables recently, starting to watch carbs. Drinks diet drinks.  More hungry recently. Cooking for himself more so these days. 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  Weight - better symptoms recently, weight increasing Wt Readings from Last 3 Encounters:  07/25/14 211 lb 8 oz (95.936 kg)  06/13/14 204 lb (92.534 kg)  06/13/14 204 lb 12 oz (92.874 kg)    Diabetes Complications-  Nephropathy- No, developing ? Stage 2   CKD, last BUN/creatinine- GFR 105 Lab Results  Component Value Date   BUN 11 04/21/2014   CREATININE 0.8 04/21/2014  Elevated urine MA results Dec 2014, October 2015 No results found for: GFR    Retinopathy- No, Last DEE was  in October 2015. Has glaucoma.  Neuropathy- occasional pins and needles sensation in his feet. No known neuropathy.  Associated history - No CAD . No prior stroke. No hypothyroidism. his last TSH was No results found for: TSH  Hyperlipidemia-  his last set of lipids were- Currently on dietary therapy and started on fenofibrate 54 mg daily at last visits. Tolerating well. Lab Results  Component Value Date   CHOL 184 04/21/2014   HDL 37 04/21/2014   TRIG 481* 04/21/2014    Blood Pressure/HTN- Patient's blood pressure is usually well controlled today on current regimen that includes ACE-I.  I have reviewed the patient's past medical history, medications and allergies.      Current Outpatient Prescriptions on File Prior to Visit  Medication Sig Dispense Refill  . amLODipine (NORVASC) 10 MG tablet Take 1 tablet (10 mg total) by mouth daily. 30  tablet 5  . fenofibrate 54 MG tablet Take 1 tablet (54 mg total) by mouth daily. 30 tablet 4  . lisinopril-hydrochlorothiazide (PRINZIDE,ZESTORETIC) 20-25 MG per tablet Take 1 tablet by mouth daily. 30 tablet 6  . ONETOUCH DELICA LANCETS 33G MISC Use for checking fingerstick glucose 4 times daily 120 each 11   No current facility-administered medications on file prior to visit.   No Known Allergies    Review of Systems- [ x ]  Complains of    [  ]  denies [  ] Recent weight change [  ]  Fatigue [  ] polydipsia [  ] polyuria [  ]  nocturia [  ]   vision difficulty [  ] chest pain [  ] shortness of breath [  ] leg swelling [  ] cough [  ] nausea/vomiting [  ] diarrhea [  ] constipation [  ] abdominal pain [  ]  tingling/numbness in extremities [  ]  concern with feet ( wounds/sores)   PE: BP 132/88 mmHg  Pulse 77  Ht 6' 1.25" (1.861 m)  Wt 211 lb 8 oz (95.936 kg)  BMI 27.70 kg/m2  SpO2 97% Wt Readings from Last 3 Encounters:  07/25/14 211 lb 8 oz (95.936 kg)  06/13/14 204 lb (92.534 kg)  06/13/14 204 lb 12 oz (92.874 kg)   Exam: deferred  ASSESSMENT AND PLAN: Problem List Items Addressed This Visit      Cardiovascular and Mediastinum   Essential hypertension    Blood pressure is at target today. Urine microalbumin positive in October 2015 in the setting of uncontrolled diabetes mellitus.will reassess at future visits once glycemic control is better.              Endocrine   Type 2 diabetes mellitus not at goal - Primary    Recent A1c not controlled. Some improvement in checking frequency and sugars, on which he was congratulated. Slightly early to update A1c.- will do so at next visit.    He will continue to work on his diet and exercise.  He will check his sugars 4 times a day- improve compliance with lunch time and bedtime checks.  Continue lantus at 42 units [place] daily at bedtime.  Change Novolog to 20+1 scale with BF and change to 18+1 scale with supper, and start taking a fixed dose of 15 units at lunch time )(if having lunch).  At next visit, could try introducing SGLT2 inhibitor and wean off rapid acting insulin.  Explained importance of staying on insulin therapy at this time, given his uncontrolled sugars.  He was given information on alternative insulin injection sites, and he reports that with above suggestions he will improve his compliance.      RTC 3 weeks.            Relevant Medications   insulin aspart (NOVOLOG FLEXPEN) 100 UNIT/ML FlexPen   Insulin Glargine (LANTUS SOLOSTAR) 100  UNIT/ML Solostar Pen     Other   Hyperlipidemia    Recent triglyceride levels were elevated in October 2015.expect improvement with better sugar control. Patient has prior history of noncompliance. Discussed low fat diet and avoiding all alcohol intake at prior visits.  continue Fenofibrate 54 mg daily. Recheck fasting levels at next visit.                  - Return to clinic in 3 weeks with sugar log/meter.  Craig Johnson 07/25/2014 1:15 PM

## 2014-07-25 NOTE — Patient Instructions (Signed)
Check sugars 4 x daily ( before each meal and at bedtime).  Record them in a log book and bring that/meter to next appointment.   Change insulin scale as follows-  Blood Glucose (mg/dL)  Breakfast  (Units Novolog Insulin)  Lunch  (Units Novolog Insulin)  Supper  (Units Novolog Insulin)  Nighttime (Units Novolog Insulin)   <70 Treat the low blood sugar.  Recheck blood glucose  in 15 mins. If sugar is more than 70, then take the number of units of insulin in the 70-90 row, if before a meal.   70-90  12   9  11   0  91-130 (Base Dose)  20  15  18   0   131-150  21  15  19   0   151-200  22  15  20   0   201-250  23  15  21  0   251-300  24  15  22 1    301-350  25  15  23 2    351-400  26  15  24  3    401-450  27  15  25 4    >450  28  15  26  5    Lantus insulin 42 units subcutaneous Nightly     Please return for fasting labs and clinic appointment in 3 weeks Take fixed dose of novolog at lunch time

## 2014-07-25 NOTE — Progress Notes (Signed)
Pre visit review using our clinic review tool, if applicable. No additional management support is needed unless otherwise documented below in the visit note. 

## 2014-07-25 NOTE — Assessment & Plan Note (Signed)
Blood pressure is at target today. Urine microalbumin positive in October 2015 in the setting of uncontrolled diabetes mellitus.will reassess at future visits once glycemic control is better.

## 2014-07-25 NOTE — Assessment & Plan Note (Signed)
Recent triglyceride levels were elevated in October 2015.expect improvement with better sugar control. Patient has prior history of noncompliance. Discussed low fat diet and avoiding all alcohol intake at prior visits.  continue Fenofibrate 54 mg daily. Recheck fasting levels at next visit.      

## 2014-08-16 ENCOUNTER — Ambulatory Visit: Payer: Federal, State, Local not specified - PPO | Admitting: Endocrinology

## 2014-08-16 ENCOUNTER — Encounter: Payer: Self-pay | Admitting: Endocrinology

## 2014-08-16 ENCOUNTER — Telehealth: Payer: Self-pay

## 2014-08-16 NOTE — Telephone Encounter (Signed)
Noted, thanks for reaching out to him.

## 2014-08-16 NOTE — Telephone Encounter (Signed)
Called patient to inquire about not showing up for apt.  No answer, left voice message on machine.  I will send a note in the mail.

## 2014-10-02 ENCOUNTER — Other Ambulatory Visit: Payer: Self-pay | Admitting: *Deleted

## 2014-10-02 MED ORDER — INSULIN ASPART 100 UNIT/ML FLEXPEN: PEN_INJECTOR | SUBCUTANEOUS | Status: DC

## 2014-11-22 ENCOUNTER — Other Ambulatory Visit: Payer: Self-pay | Admitting: *Deleted

## 2014-11-22 MED ORDER — FENOFIBRATE 54 MG PO TABS: 54.0000 mg | ORAL_TABLET | Freq: Every day | ORAL | Status: DC

## 2015-01-08 ENCOUNTER — Other Ambulatory Visit: Payer: Self-pay

## 2015-01-08 MED ORDER — FENOFIBRATE 54 MG PO TABS: 54.0000 mg | ORAL_TABLET | Freq: Every day | ORAL | Status: DC

## 2015-01-08 NOTE — Telephone Encounter (Signed)
Please advise refill? 

## 2015-02-11 ENCOUNTER — Other Ambulatory Visit: Payer: Self-pay | Admitting: *Deleted

## 2015-02-11 ENCOUNTER — Other Ambulatory Visit: Payer: Self-pay | Admitting: Nurse Practitioner

## 2015-02-11 MED ORDER — INSULIN GLARGINE 100 UNIT/ML SOLOSTAR PEN: 42.0000 [IU] | PEN_INJECTOR | Freq: Every day | SUBCUTANEOUS | Status: DC

## 2015-02-11 NOTE — Telephone Encounter (Signed)
Please advise refill?  Last OV was 06/13/14

## 2015-02-14 ENCOUNTER — Ambulatory Visit: Payer: Federal, State, Local not specified - PPO | Admitting: Endocrinology

## 2015-02-15 ENCOUNTER — Ambulatory Visit (INDEPENDENT_AMBULATORY_CARE_PROVIDER_SITE_OTHER): Payer: Federal, State, Local not specified - PPO | Admitting: Endocrinology

## 2015-02-15 ENCOUNTER — Other Ambulatory Visit: Payer: Self-pay | Admitting: *Deleted

## 2015-02-15 ENCOUNTER — Encounter: Payer: Self-pay | Admitting: Endocrinology

## 2015-02-15 VITALS — BP 140/88 | HR 90 | Temp 97.9°F | Resp 16 | Ht 73.25 in | Wt 196.8 lb

## 2015-02-15 DIAGNOSIS — E785 Hyperlipidemia, unspecified: Secondary | ICD-10-CM

## 2015-02-15 DIAGNOSIS — E1165 Type 2 diabetes mellitus with hyperglycemia: Secondary | ICD-10-CM | POA: Diagnosis not present

## 2015-02-15 DIAGNOSIS — IMO0002 Reserved for concepts with insufficient information to code with codable children: Secondary | ICD-10-CM

## 2015-02-15 DIAGNOSIS — I10 Essential (primary) hypertension: Secondary | ICD-10-CM | POA: Diagnosis not present

## 2015-02-15 DIAGNOSIS — E119 Type 2 diabetes mellitus without complications: Secondary | ICD-10-CM

## 2015-02-15 LAB — MICROALBUMIN / CREATININE URINE RATIO
CREATININE, U: 249.9 mg/dL
MICROALB UR: 3.4 mg/dL — AB (ref 0.0–1.9)
MICROALB/CREAT RATIO: 1.4 mg/g (ref 0.0–30.0)

## 2015-02-15 LAB — COMPREHENSIVE METABOLIC PANEL
ALBUMIN: 4.6 g/dL (ref 3.5–5.2)
ALK PHOS: 58 U/L (ref 39–117)
ALT: 41 U/L (ref 0–53)
AST: 30 U/L (ref 0–37)
BUN: 19 mg/dL (ref 6–23)
CALCIUM: 10 mg/dL (ref 8.4–10.5)
CO2: 29 mEq/L (ref 19–32)
Chloride: 97 mEq/L (ref 96–112)
Creatinine, Ser: 1.46 mg/dL (ref 0.40–1.50)
GFR: 55.41 mL/min — AB (ref >60.00)
Glucose, Bld: 310 mg/dL — ABNORMAL HIGH (ref 70–99)
POTASSIUM: 3.2 meq/L — AB (ref 3.5–5.1)
Sodium: 136 mEq/L (ref 135–145)
TOTAL PROTEIN: 7.6 g/dL (ref 6.0–8.3)
Total Bilirubin: 0.6 mg/dL (ref 0.2–1.2)

## 2015-02-15 LAB — POCT GLYCOSYLATED HEMOGLOBIN (HGB A1C): HEMOGLOBIN A1C: 11.6

## 2015-02-15 MED ORDER — ONETOUCH DELICA LANCETS 33G MISC: Status: DC

## 2015-02-15 MED ORDER — INSULIN PEN NEEDLE 32G X 4 MM MISC: Status: DC

## 2015-02-15 MED ORDER — FENOFIBRATE 145 MG PO TABS: 145.0000 mg | ORAL_TABLET | Freq: Every day | ORAL | Status: DC

## 2015-02-15 MED ORDER — INSULIN ASPART 100 UNIT/ML FLEXPEN: PEN_INJECTOR | SUBCUTANEOUS | Status: DC

## 2015-02-15 MED ORDER — TOUJEO SOLOSTAR 300 UNIT/ML ~~LOC~~ SOPN: 50.0000 [IU] | PEN_INJECTOR | Freq: Every day | SUBCUTANEOUS | Status: DC

## 2015-02-15 MED ORDER — GLUCOSE BLOOD VI STRP: ORAL_STRIP | Status: DC

## 2015-02-15 NOTE — Patient Instructions (Addendum)
Lantus 46 units in am  Novolog 30 units Novolog at supper except 25 units when active; 22 Novolog at other meals based on type of meal  Check blood sugars on waking up .Marland Kitchen 3-4 .Marland Kitchen times a week Also check blood sugars about 2 hours after a meal and do this after different meals by rotation  Recommended blood sugar levels on waking up is 90-130 and about 2 hours after meal is 140-180 Please bring blood sugar monitor to each visit.  Ask insurance if insulin pump is covered

## 2015-02-15 NOTE — Progress Notes (Signed)
Patient ID: Craig Johnson, male   DOB: 06/09/1971, 44 y.o.   MRN: [id number]           Reason for Appointment: Consultation for Type 2 Diabetes  Referring physician: None  History of Present Illness:          Date of diagnosis of type 2 diabetes mellitus: 2013       Background history:  He probably had significantly high blood sugars at time of diagnosis but details are not available. He thinks he was tried on various oral medications initially and these did not improve his blood sugar (Metformin, Januvia, Farxiga) Probably was started on insulin about 3 months after diagnosis Apparently was on as much as 60 units of Lantus daily.   Has not been continued on oral medications with insulin  Recent history:   INSULIN regimen is described as: 42 lantus hs, Novolog 22 ac tid       His blood sugars have been consistently poorly controlled over the last 2 years as judged by his A1c levels of around 12% Part of his poor control has been noncompliance with various measures of self-care including taking insulin  Current blood sugar patterns and problems identified:  He does not take his Lantus consistently, may be missing at about 2 days a week  He has not had a functioning glucose monitor for quite some time and does not know what his blood sugars are  He says he has been having a lot of stress lately and also his weight has come down  Although he has significantly high readings he does not always have symptoms of increased thirst or urination.  He generally tries to avoid regular soft drinks  On his last visit with his endocrinologist in January his blood sugars were progressively higher during the day but usually over 116 the morning also  Oral hypoglycemic drugs the patient is taking are: None      Side effects from medications have been: None  Compliance with the medical regimen: Poor  Hypoglycemia:   never  Glucose monitoring: not done           Glucometer:  One Touch  previously    Blood Glucose readings by time of day and averages from meter download:  Self-care:    Previous diabetes education: Only at diagnosis time Diet: He has variable intake and variable times when eating lunch, occasionally will skip lunch               Exercise: Treadmill 3/7 days a week in the evenings   Weight history:  Wt Readings from Last 3 Encounters:  02/15/15 196 lb 12.8 oz (89.268 kg)  07/25/14 211 lb 8 oz (95.936 kg)  06/13/14 204 lb (92.534 kg)    Glycemic control:   Lab Results  Component Value Date   HGBA1C 11.6 02/15/2015   HGBA1C 12.3* 05/16/2014   HGBA1C 11.9* 08/21/2013   Lab Results  Component Value Date   CREATININE 0.8 04/21/2014         Medication List       This list is accurate as of: 02/15/15  2:54 PM.  Always use your most recent med list.               amLODipine 10 MG tablet  Commonly known as:  NORVASC  Take 1 tablet (10 mg total) by mouth daily.     fenofibrate 145 MG tablet  Commonly known as:  TRICOR  Take 1 tablet (145 mg  total) by mouth daily.     glucose blood test strip  Commonly known as:  ONE TOUCH ULTRA TEST  Use to test fingerstick glucose 4 times daily     insulin aspart 100 UNIT/ML FlexPen  Commonly known as:  NOVOLOG FLEXPEN  Use on a sliding scale between 20-30 units three times daily     Insulin Pen Needle 32G X 4 MM Misc  Commonly known as:  BD PEN NEEDLE NANO U/F  Use for insulin injections 4 times daily.     lisinopril-hydrochlorothiazide 20-25 MG per tablet  Commonly known as:  PRINZIDE,ZESTORETIC  Take 1 tablet by mouth daily.     ONETOUCH DELICA LANCETS 33G Misc  Use for checking fingerstick glucose 4 times daily     TOUJEO SOLOSTAR 300 UNIT/ML Sopn  Generic drug:  Insulin Glargine  Inject 50 Units into the skin daily.        Allergies: No Known Allergies  Past Medical History  Diagnosis Date  . Diabetes mellitus without complication     Type 2  . Hypertension   . DKA  (diabetic ketoacidoses)   . Anxiety   . Depression   . Sleep apnea   . Heart murmur     No past surgical history on file.  Family History  Problem Relation Age of Onset  . Diabetes Father   . Hypertension Father   . Diabetes Brother   . Hypertension Mother   . Hypertension Maternal Grandmother     Social History:  reports that he has never smoked. He has never used smokeless tobacco. He reports that he drinks alcohol. He reports that he does not use illicit drugs.    Review of Systems    Lipid history: Has not been on any statin drugs, tends to high triglycerides but is taking only 54 mg of fenofibrate now    Lab Results  Component Value Date   CHOL 184 04/21/2014   HDL 37 04/21/2014   TRIG 481* 04/21/2014          Most recent eye exam was 10/15, he thinks he has been followed for glaucoma  Review of Systems  Constitutional: Positive for weight loss and malaise/fatigue.  Eyes: Negative for blurred vision.  Respiratory: Negative for shortness of breath.   Cardiovascular: Negative for palpitations.  Gastrointestinal: Positive for heartburn.  Genitourinary: Negative for frequency.       Nocturia  Neurological: Positive for tingling.       No numbness in hands or feet, no sharp leg pains  Endo/Heme/Allergies: Positive for polydipsia.       Thirsty at times  Psychiatric/Behavioral: Negative for depression. The patient is not nervous/anxious and does not have insomnia.    Hypertension: Treated with lisinopril HCT , has been hypertensive for 6 years  Neurological:   The last foot exam normal in 11/15 Has no numbness, burning, pains but has some tingling in feet     LABS:  Orders Only on 02/15/2015  Component Date Value Ref Range Status  . Hemoglobin A1C 02/15/2015 11.6   Final    Physical Examination:  BP 140/88 mmHg  Pulse 90  Temp(Src) 97.9 F (36.6 C)  Resp 16  Ht 6' 1.25" (1.861 m)  Wt 196 lb 12.8 oz (89.268 kg)  BMI 25.78 kg/m2  SpO2 95%  No  pedal edema       ASSESSMENT:  Diabetes type 2, insulin-dependent and persistently uncontrolled     He is not getting enough insulin and his  compliance with insulin regimen is poor Also appears to be requiring large amounts of insulin despite his BMI only of 26 He has not been motivated to take care of his diabetes in many ways and also to check blood sugars A1c is significantly high at 11.6 today and his glucoses over 300 in the office after breakfast today  His insulin regimen will need to be modified and most likely he will need of better coverage of his 24-hour basal insulin with either Toujeo or [place]; however these appear not to be covered by insurance  Complications: None evident  HYPERTENSION: Blood pressure is relatively higher today, is followed by PCP  PLAN:   Increase Lantus by 4 units and switch to the morning as it may cover his afternoon and evening readings better; may need to continually adjust his basal insulin to get fasting blood sugar down  On his next prescription he will try Toujeo 50 units in the morning and discussed differences between this and Lantus  Increase suppertime dose to at least 30 units and also make sure he takes insulin before eating consistently, continue 22 units at breakfast and lunch  Start using One Touch Verio monitor to check his blood sugar, showed him how to use this  Discussed timing and frequency of blood sugars and blood sugar targets  Consultation with diabetes nurse educator after initiating insulin regimen  Better compliance with mealtime insulin  Consider insulin pump if covered, he probably will not benefit from the V-go pump because of his large mealtime insulin requirement  Consider adding metformin  Check urine microalbumin today  Increase fenofibrate to a therapeutic dose of 145 mg as 54 mg not effective in his renal function has been normal  Recheck chemistry panel today  Patient Instructions  Lantus 46 units  in am  Novolog 30 units Novolog at supper except 25 units when active; 22 Novolog at other meals based on type of meal  Check blood sugars on waking up .Marland Kitchen 3-4 .Marland Kitchen times a week Also check blood sugars about 2 hours after a meal and do this after different meals by rotation  Recommended blood sugar levels on waking up is 90-130 and about 2 hours after meal is 140-180 Please bring blood sugar monitor to each visit.  Ask insurance if insulin pump is covered   Counseling time on subjects discussed above is over 50% of today's 40 minute visit  Makaylen Thieme 02/15/2015, 2:54 PM   Note: This office note was prepared with Dragon voice recognition system technology. Any transcriptional errors that result from this process are unintentional.

## 2015-02-25 ENCOUNTER — Encounter: Payer: Federal, State, Local not specified - PPO | Attending: Endocrinology | Admitting: Nutrition

## 2015-02-25 DIAGNOSIS — Z794 Long term (current) use of insulin: Secondary | ICD-10-CM | POA: Insufficient documentation

## 2015-02-25 DIAGNOSIS — Z713 Dietary counseling and surveillance: Secondary | ICD-10-CM | POA: Insufficient documentation

## 2015-02-25 DIAGNOSIS — E119 Type 2 diabetes mellitus without complications: Secondary | ICD-10-CM | POA: Diagnosis not present

## 2015-02-25 NOTE — Progress Notes (Signed)
Patient is here today to review diet and blood sugar readings. Typical day: 7AM: Egg/sausage, cheese sandwich, with diet drink 11-3: Lunch: sandwich, or pizza, with diet drink or water--no chips or fries 6-7PM, eats out at [employer] or other similar restaurants.  Has 5-6 ounces meat, 1 starchy veg., and 1 nonstarchy veg. With 1 serving of bread and diet dirink Denies HS snack  Insulin dose:  Toujeo: 50u in AM,                        Novolog: 25u acmeals NO NOVOLOG ACL.  _he does not take extra when high, eating more  Activity: none SBGM:  He did not bring his meterFBS today: 206, says they are usually in the 200s. Despite switching to Toujeo--but are lower than they have been.   AcS: Always high, and the amount is variable depending on lunch size.  Discussed the need for the Novolog acL.  He agreed to bring his Novolog to work each day and take it acL Also discussed the need to take more Novolog when blood sugars are high.  He was told to take 1u of every 50 points over 150, and he did some examples and calculated the doses correctly.  He was reminded not to adjust the Toujeo dose, and he reported good understanding of this.   We also discussed the need to adjust the meal time Novolog dose depending on the amounts of carbs eaten.  We reviewed what carbs were and he had a good understanding of this as well.  He had no final questions.

## 2015-02-26 ENCOUNTER — Other Ambulatory Visit: Payer: Self-pay | Admitting: *Deleted

## 2015-02-26 MED ORDER — POTASSIUM CHLORIDE CRYS ER 20 MEQ PO TBCR: 20.0000 meq | EXTENDED_RELEASE_TABLET | Freq: Every day | ORAL | Status: DC

## 2015-02-26 NOTE — Patient Instructions (Signed)
Always take Novolog before lunch meal Take one unit of Novolog for every 50 points over 150.

## 2015-03-05 ENCOUNTER — Other Ambulatory Visit: Payer: Self-pay | Admitting: Nurse Practitioner

## 2015-03-05 ENCOUNTER — Ambulatory Visit (INDEPENDENT_AMBULATORY_CARE_PROVIDER_SITE_OTHER): Payer: Federal, State, Local not specified - PPO | Admitting: Nurse Practitioner

## 2015-03-05 ENCOUNTER — Other Ambulatory Visit: Payer: Self-pay | Admitting: Family Medicine

## 2015-03-05 VITALS — BP 140/96 | HR 81 | Temp 99.4°F | Resp 18 | Ht 73.25 in | Wt 206.4 lb

## 2015-03-05 DIAGNOSIS — H6122 Impacted cerumen, left ear: Secondary | ICD-10-CM | POA: Diagnosis not present

## 2015-03-05 DIAGNOSIS — K219 Gastro-esophageal reflux disease without esophagitis: Secondary | ICD-10-CM | POA: Diagnosis not present

## 2015-03-05 NOTE — Patient Instructions (Signed)

## 2015-03-05 NOTE — Progress Notes (Signed)
Pre visit review using our clinic review tool, if applicable. No additional management support is needed unless otherwise documented below in the visit note. 

## 2015-03-05 NOTE — Progress Notes (Signed)
Patient ID: Craig Johnson, male    DOB: 02/02/1971  Age: 44 y.o. MRN: [id number]  CC: Otalgia   HPI Craig Johnson presents for otaliga of left ear x 1 week and heart burn x 3 months.   1) Left ear pain x 1.5 weeks intermittently, stabbing pains to dull ache   Not using q-tips or cleaning solutions   2) GERD- x 3 months  Rolaids- helpful intermittently  Pepto-bismol- helpful somewhat   Burning, coughing, bad taste in mouth   History [name] has a past medical history of Diabetes mellitus without complication; Hypertension; DKA (diabetic ketoacidoses); Anxiety; Depression; Sleep apnea; and Heart murmur.   He has no past surgical history on file.   His family history includes Diabetes in his brother and father; Hypertension in his father, maternal grandmother, and mother.He reports that he has never smoked. He has never used smokeless tobacco. He reports that he drinks alcohol. He reports that he does not use illicit drugs.  Outpatient Prescriptions Prior to Visit  Medication Sig Dispense Refill  . fenofibrate (TRICOR) 145 MG tablet Take 1 tablet (145 mg total) by mouth daily. 30 tablet 3  . glucose blood (ONETOUCH VERIO) test strip Use as instructed to check blood sugar 3 times per day dx code E11.9 100 each 2  . Insulin Pen Needle (BD PEN NEEDLE NANO U/F) 32G X 4 MM MISC Use for insulin injections 4 times daily. 120 each 11  . ONETOUCH DELICA LANCETS 33G MISC Use to check blood sugar 3 time a day dx code E11.9 100 each 2  . potassium chloride SA (K-DUR,KLOR-CON) 20 MEQ tablet Take 1 tablet (20 mEq total) by mouth daily. 30 tablet 3  . TOUJEO SOLOSTAR 300 UNIT/ML SOPN Inject 50 Units into the skin daily. 3 pen 3  . amLODipine (NORVASC) 10 MG tablet Take 1 tablet (10 mg total) by mouth daily. 30 tablet 5  . insulin aspart (NOVOLOG FLEXPEN) 100 UNIT/ML FlexPen Inject up to 30 units three times a day 30 mL 2  . lisinopril-hydrochlorothiazide (PRINZIDE,ZESTORETIC) 20-25 MG per tablet  Take 1 tablet by mouth daily. 30 tablet 6   No facility-administered medications prior to visit.    ROS Review of Systems  Constitutional: Negative for fever, chills, diaphoresis and fatigue.  HENT: Positive for ear pain. Negative for ear discharge and trouble swallowing.   Respiratory: Positive for cough. Negative for chest tightness and wheezing.   Cardiovascular: Negative for chest pain, palpitations and leg swelling.  Gastrointestinal: Negative for nausea, vomiting and diarrhea.       Burning sensation of esophagus, bad taste in mouth  Skin: Negative for rash.    Objective:  BP 140/96 mmHg  Pulse 81  Temp(Src) 99.4 F (37.4 C)  Resp 18  Ht 6' 1.25" (1.861 m)  Wt 206 lb 6.4 oz (93.622 kg)  BMI 27.03 kg/m2  SpO2 96%  Physical Exam  Constitutional: He is oriented to person, place, and time. He appears well-developed and well-nourished. No distress.  HENT:  Head: Normocephalic and atraumatic.  Right Ear: External ear normal.  Left Ear: External ear normal.  Mouth/Throat: Oropharynx is clear and moist. No oropharyngeal exudate.  TM clear on right with no retraction or bulging.  TM on left blocked by excessive wet cerumen  Cardiovascular: Normal rate, regular rhythm and normal heart sounds.  Exam reveals no gallop and no friction rub.   No murmur heard. Pulmonary/Chest: Effort normal and breath sounds normal. No respiratory distress. He has no wheezes. He  has no rales. He exhibits no tenderness.  Neurological: He is alert and oriented to person, place, and time.  Skin: Skin is warm and dry. No rash noted. He is not diaphoretic.  Psychiatric: He has a normal mood and affect. His behavior is normal. Judgment and thought content normal.      Assessment & Plan:   Roxas was seen today for otalgia.  Diagnoses and all orders for this visit:  Gastroesophageal reflux disease without esophagitis  Excessive cerumen in ear canal, left   I am having Mr. Stuck maintain  his TOUJEO SOLOSTAR, fenofibrate, ONETOUCH DELICA LANCETS 33G, glucose blood, Insulin Pen Needle, and potassium chloride SA.  No orders of the defined types were placed in this encounter.     Follow-up: Return if symptoms worsen or fail to improve.

## 2015-03-06 ENCOUNTER — Other Ambulatory Visit: Payer: Self-pay | Admitting: *Deleted

## 2015-03-06 MED ORDER — LISINOPRIL-HYDROCHLOROTHIAZIDE 20-25 MG PO TABS: 1.0000 | ORAL_TABLET | Freq: Every day | ORAL | Status: DC

## 2015-03-06 MED ORDER — INSULIN ASPART 100 UNIT/ML FLEXPEN: PEN_INJECTOR | SUBCUTANEOUS | Status: DC

## 2015-03-06 NOTE — Telephone Encounter (Signed)
Routing to provider. It says 5 refills on his med list, but it was written in Dec.

## 2015-03-13 ENCOUNTER — Other Ambulatory Visit: Payer: Self-pay | Admitting: *Deleted

## 2015-03-13 ENCOUNTER — Telehealth: Payer: Self-pay | Admitting: Endocrinology

## 2015-03-13 MED ORDER — INSULIN DEGLUDEC 200 UNIT/ML ~~LOC~~ SOPN: 50.0000 [IU] | PEN_INJECTOR | Freq: Every day | SUBCUTANEOUS | Status: DC

## 2015-03-13 NOTE — Telephone Encounter (Signed)
Rx sent, unable to reach patient by phone.

## 2015-03-13 NOTE — Telephone Encounter (Signed)
He can try U-200 [place]

## 2015-03-13 NOTE — Telephone Encounter (Signed)
toujeo is too expensive and he cannot use the discount card please advise Insurance did not offer an alternate

## 2015-03-13 NOTE — Telephone Encounter (Signed)
Please see below adn advise

## 2015-03-17 ENCOUNTER — Encounter: Payer: Self-pay | Admitting: Nurse Practitioner

## 2015-03-17 DIAGNOSIS — H6120 Impacted cerumen, unspecified ear: Secondary | ICD-10-CM | POA: Insufficient documentation

## 2015-03-17 DIAGNOSIS — K219 Gastro-esophageal reflux disease without esophagitis: Secondary | ICD-10-CM | POA: Insufficient documentation

## 2015-03-17 NOTE — Assessment & Plan Note (Signed)
Asked pt to try Debrox over the counter. Return to care if not improved for removal in office.

## 2015-03-17 NOTE — Assessment & Plan Note (Signed)
GERD diet was discussed with patient. Encouraged him to circle those on AVS that worsen heart burn. Asked pt to try Zantac 150 mg daily before breakfast or largest meal. FU prn worsening/failure to improve.

## 2015-03-18 ENCOUNTER — Encounter: Payer: Self-pay | Admitting: Endocrinology

## 2015-03-18 ENCOUNTER — Ambulatory Visit (INDEPENDENT_AMBULATORY_CARE_PROVIDER_SITE_OTHER): Payer: Federal, State, Local not specified - PPO | Admitting: Endocrinology

## 2015-03-18 VITALS — BP 130/90 | HR 77 | Temp 98.5°F | Ht 73.5 in | Wt 205.0 lb

## 2015-03-18 DIAGNOSIS — E785 Hyperlipidemia, unspecified: Secondary | ICD-10-CM | POA: Diagnosis not present

## 2015-03-18 DIAGNOSIS — IMO0002 Reserved for concepts with insufficient information to code with codable children: Secondary | ICD-10-CM

## 2015-03-18 DIAGNOSIS — E1165 Type 2 diabetes mellitus with hyperglycemia: Secondary | ICD-10-CM | POA: Diagnosis not present

## 2015-03-18 MED ORDER — METFORMIN HCL ER 750 MG PO TB24: 750.0000 mg | ORAL_TABLET | Freq: Every day | ORAL | Status: DC

## 2015-03-18 NOTE — Patient Instructions (Signed)
52 lantus and go up 2 if am sugar stays > 140  For smaller meals try 24 units and larger 28 units Novolog

## 2015-03-18 NOTE — Progress Notes (Signed)
Patient ID: Craig Johnson, male   DOB: 1970-12-07, 44 y.o.   MRN: [id number]           Reason for Appointment: Follow-up for Type 2 Diabetes  Referring physician: None  History of Present Illness:          Date of diagnosis of type 2 diabetes mellitus: 2013       Background history:  He probably had significantly high blood sugars at time of diagnosis but details are not available. He thinks he was tried on various oral medications initially and these did not improve his blood sugar (Metformin, Januvia, Farxiga) Probably was started on insulin about 3 months after diagnosis Apparently was on as much as 60 units of Lantus daily.   Recent history:  INSULIN regimen is described as: 50 lantus in a.m., Novolog 25 ac tid       His blood sugars have been consistently poorly controlled over the last 2 years as judged by his A1c levels of around 12% On his consultation he was advised to be more consistent with his insulin regimen and also increase the Lantus dose  Current blood sugar patterns and problems identified:  He does try to take the Lantus more consistently since it has been switched to the morning.  However his fasting blood sugars are still mostly high  Also he has done a little better with his mealtime coverage; still occasionally forgetting his Novolog.  He has seen the diabetes educator and is still not sure about insulin pump which was suggestive because of improved compliance with this  His Novolog dose has been increased, however he does not know how to adjust this based on his carbohydrate intake; last night his glucose was relatively better at 166 with eating a smaller meal  He will sometimes have a relatively high fat meals  Oral hypoglycemic drugs the patient is taking are: None      Side effects from medications have been: None  Compliance with the medical regimen: Fair  Hypoglycemia:   never  Glucose monitoring: not done           Glucometer:  One Touch  previously    Blood Glucose readings by time of day and averages from meter download:  Mean values apply above for all meters except median for One Touch  PRE-MEAL Fasting Lunch  2-7 PM  Bedtime Overall  Glucose range:  108-302   142-299   76-279  120-425    Mean/median:  204      208     Self-care:    Previous diabetes education: Only at diagnosis time Diet: He has variable intake and variable times when eating lunch, occasionally will skip lunch               Exercise: Treadmill 3/7 days a week in the evenings   Weight history:  Wt Readings from Last 3 Encounters:  03/18/15 205 lb (92.987 kg)  03/05/15 206 lb 6.4 oz (93.622 kg)  02/15/15 196 lb 12.8 oz (89.268 kg)    Glycemic control:   Lab Results  Component Value Date   HGBA1C 11.6 02/15/2015   HGBA1C 12.3* 05/16/2014   HGBA1C 11.9* 08/21/2013   Lab Results  Component Value Date   MICROALBUR 3.4* 02/15/2015   CREATININE 1.46 02/15/2015         Medication List       This list is accurate as of: 03/18/15 11:59 PM.  Always use your most recent med list.  amLODipine 10 MG tablet  Commonly known as:  NORVASC  TAKE 1 TABLET (10 MG TOTAL) BY MOUTH DAILY.     amLODipine 10 MG tablet  Commonly known as:  NORVASC  TAKE 1 TABLET BY MOUTH EVERY DAY     fenofibrate 145 MG tablet  Commonly known as:  TRICOR  Take 1 tablet (145 mg total) by mouth daily.     fenofibrate 54 MG tablet  TAKE 1 TABLET (54 MG TOTAL) BY MOUTH DAILY. **NEED APPOINTMENT     glucose blood test strip  Commonly known as:  ONETOUCH VERIO  Use as instructed to check blood sugar 3 times per day dx code E11.9     insulin aspart 100 UNIT/ML FlexPen  Commonly known as:  NOVOLOG FLEXPEN  Inject up to 30 units three times a day     Insulin Degludec 200 UNIT/ML Sopn  Commonly known as:  TRESIBA FLEXTOUCH  Inject 50 Units into the skin daily.     Insulin Pen Needle 32G X 4 MM Misc  Commonly known as:  BD PEN NEEDLE NANO U/F    Use for insulin injections 4 times daily.     lisinopril-hydrochlorothiazide 20-25 MG per tablet  Commonly known as:  PRINZIDE,ZESTORETIC  Take 1 tablet by mouth daily.     metFORMIN 750 MG 24 hr tablet  Commonly known as:  GLUCOPHAGE-XR  Take 1 tablet (750 mg total) by mouth daily with breakfast.     ONETOUCH DELICA LANCETS 33G Misc  Use to check blood sugar 3 time a day dx code E11.9     potassium chloride SA 20 MEQ tablet  Commonly known as:  K-DUR,KLOR-CON  Take 1 tablet (20 mEq total) by mouth daily.     TOUJEO SOLOSTAR 300 UNIT/ML Sopn  Generic drug:  Insulin Glargine  Inject 50 Units into the skin daily.        Allergies: No Known Allergies  Past Medical History  Diagnosis Date  . Diabetes mellitus without complication     Type 2  . Hypertension   . DKA (diabetic ketoacidoses)   . Anxiety   . Depression   . Sleep apnea   . Heart murmur     No past surgical history on file.  Family History  Problem Relation Age of Onset  . Diabetes Father   . Hypertension Father   . Diabetes Brother   . Hypertension Mother   . Hypertension Maternal Grandmother     Social History:  reports that he has never smoked. He has never used smokeless tobacco. He reports that he drinks alcohol. He reports that he does not use illicit drugs.    Review of Systems    Lipid history: Has not been on any statin drugs, tends to high triglycerides but is taking 54 mg of fenofibrate now    Lab Results  Component Value Date   CHOL 184 04/21/2014   HDL 37 04/21/2014   TRIG 481* 04/21/2014          Most recent eye exam was 10/15, he thinks he has been followed for glaucoma  ROS Hypertension: Treated with lisinopril HCT , has been hypertensive for 6 years  Neurological:   The last foot exam normal in 11/15 Has no numbness, burning, pains but has some tingling in feet     LABS:  No visits with results within 1 Week(s) from this visit. Latest known visit with results  is:  Orders Only on 02/15/2015  Component Date Value Ref  Range Status  . Hemoglobin A1C 02/15/2015 11.6   Final    Physical Examination:  BP 130/90 mmHg  Pulse 77  Temp(Src) 98.5 F (36.9 C) (Oral)  Ht 6' 1.5" (1.867 m)  Wt 205 lb (92.987 kg)  BMI 26.68 kg/m2  SpO2 98%  No pedal edema       ASSESSMENT:  Diabetes type 2, insulin-dependent and persistently uncontrolled     He is still not getting enough insulin even though his compliance is improving He does better with taking his Lantus in the morning but fasting readings only slowly coming down Also has periodic high postprandial readings especially when he does not watch his diet or forgetting to Novolog Although he is a good candidate for an insulin pump not clear if he is ready for this as yet and need significant amount of diabetes education Most likely will not be a candidate for V-go pump because of his large mealtime requirement  PLAN:   Since Toujeo or [place] are not covered by insurance at the moment he will continue Lantus and increase the dose; his fasting readings probably will improve with overall better control of his postprandial readings at night  He will need to take at least 28 units for large meals  He will follow-up with nurse educator regarding starting the Omnipod and discussed again the advantages of insulin pumps especially with his difficulty with compliance with mealtime insulin  Regular exercise  Watch portions and high fat meals  Follow-up with PCP for hypertension  He needs to have fasting lipids checked by his PCP  Patient Instructions  52 lantus and go up 2 if am sugar stays > 140  For smaller meals try 24 units and larger 28 units Novolog   Counseling time on subjects discussed above is over 50% of today's 25 minute visit  KUMAR,AJAY 03/19/2015, 12:41 PM   Note: This office note was prepared with Insurance underwriter. Any transcriptional errors that result  from this process are unintentional.

## 2015-04-29 ENCOUNTER — Encounter: Payer: Self-pay | Admitting: Endocrinology

## 2015-04-29 ENCOUNTER — Other Ambulatory Visit: Payer: Self-pay

## 2015-04-29 ENCOUNTER — Ambulatory Visit (INDEPENDENT_AMBULATORY_CARE_PROVIDER_SITE_OTHER): Payer: Federal, State, Local not specified - PPO | Admitting: Endocrinology

## 2015-04-29 VITALS — BP 147/98 | HR 91 | Temp 98.9°F | Ht 73.25 in | Wt 210.4 lb

## 2015-04-29 DIAGNOSIS — Z794 Long term (current) use of insulin: Secondary | ICD-10-CM | POA: Diagnosis not present

## 2015-04-29 DIAGNOSIS — E1165 Type 2 diabetes mellitus with hyperglycemia: Secondary | ICD-10-CM | POA: Diagnosis not present

## 2015-04-29 MED ORDER — INSULIN GLARGINE 100 UNIT/ML SOLOSTAR PEN: 50.0000 [IU] | PEN_INJECTOR | Freq: Every morning | SUBCUTANEOUS | Status: DC

## 2015-04-29 NOTE — Progress Notes (Signed)
Patient ID: Craig Johnson, male   DOB: 1971/04/20, 44 y.o.   MRN: [id number]           Reason for Appointment: Follow-up for Type 2 Diabetes  Referring physician: None  History of Present Illness:          Date of diagnosis of type 2 diabetes mellitus: 2013       Background history:  He probably had significantly high blood sugars at time of diagnosis but details are not available. He thinks he was tried on various oral medications initially and these did not improve his blood sugar (Metformin, Januvia, Farxiga) Probably was started on insulin about 3 months after diagnosis Apparently was on as much as 60 units of Lantus daily.   Recent history:  INSULIN regimen is described as: 50 lantus in a.m., Novolog 25 ac tid       His blood sugars have been consistently poorly controlled over the last 2 years as judged by his A1c levels of around 12% On his last visit he was advised to increase his Lantus to get morning sugars back in to control and also readings after meals but he has not made any changes Previously was having trouble remembering to take his Lantus in the evening and is taking it in the morning  Current blood sugar patterns and problems identified:  Again his fasting blood sugars are still mostly high  Recently has done to readings around lunchtime and one in the afternoon which are slightly lower  Has only 2 readings after supper which are both relatively  high   He does not adjust his Novolog based on meal size; also has some difficulty remembering to take this consistently  Is not familiar with carbohydrate counting   He will sometimes have a relatively high fat meals   he has not been consistent with exercise recently and has gained some weight  Oral hypoglycemic drugs the patient is taking are: None      Side effects from medications have been: None  Compliance with the medical regimen: Fair  Hypoglycemia:   never  Glucose monitoring:  about once a day            Glucometer:  One Touch     Blood Glucose readings by time of day and averages from meter download:  Mean values apply above for all meters except median for One Touch  PRE-MEAL Fasting Lunch Dinner Bedtime Overall  Glucose range:  135-273       Mean/median:  190      191+/-45     Self-care:    Previous diabetes education: Only at diagnosis time Diet: He has variable intake and variable times when eating lunch, occasionally will skip lunch               Exercise: Treadmill 0-3/7 days a week in the evenings   Weight history:   Wt Readings from Last 3 Encounters:  04/29/15 210 lb 6 oz (95.425 kg)  03/18/15 205 lb (92.987 kg)  03/05/15 206 lb 6.4 oz (93.622 kg)    Glycemic control:   Lab Results  Component Value Date   HGBA1C 11.6 02/15/2015   HGBA1C 12.3* 05/16/2014   HGBA1C 11.9* 08/21/2013   Lab Results  Component Value Date   MICROALBUR 3.4* 02/15/2015   CREATININE 1.46 02/15/2015         Medication List       This list is accurate as of: 04/29/15 11:59 PM.  Always use your  most recent med list.               amLODipine 10 MG tablet  Commonly known as:  NORVASC  TAKE 1 TABLET (10 MG TOTAL) BY MOUTH DAILY.     amLODipine 10 MG tablet  Commonly known as:  NORVASC  TAKE 1 TABLET BY MOUTH EVERY DAY     fenofibrate 145 MG tablet  Commonly known as:  TRICOR  Take 1 tablet (145 mg total) by mouth daily.     fenofibrate 54 MG tablet  TAKE 1 TABLET (54 MG TOTAL) BY MOUTH DAILY. **NEED APPOINTMENT     glucose blood test strip  Commonly known as:  ONETOUCH VERIO  Use as instructed to check blood sugar 3 times per day dx code E11.9     insulin aspart 100 UNIT/ML FlexPen  Commonly known as:  NOVOLOG FLEXPEN  Inject up to 30 units three times a day     Insulin Degludec 200 UNIT/ML Sopn  Commonly known as:  TRESIBA FLEXTOUCH  Inject 50 Units into the skin daily.     Insulin Pen Needle 32G X 4 MM Misc  Commonly known as:  BD PEN NEEDLE NANO U/F   Use for insulin injections 4 times daily.     lisinopril-hydrochlorothiazide 20-25 MG tablet  Commonly known as:  PRINZIDE,ZESTORETIC  Take 1 tablet by mouth daily.     metFORMIN 750 MG 24 hr tablet  Commonly known as:  GLUCOPHAGE-XR  Take 1 tablet (750 mg total) by mouth daily with breakfast.     ONETOUCH DELICA LANCETS 33G Misc  Use to check blood sugar 3 time a day dx code E11.9     potassium chloride SA 20 MEQ tablet  Commonly known as:  K-DUR,KLOR-CON  Take 1 tablet (20 mEq total) by mouth daily.     TOUJEO SOLOSTAR 300 UNIT/ML Sopn  Generic drug:  Insulin Glargine  Inject 50 Units into the skin daily.     Insulin Glargine 100 UNIT/ML Solostar Pen  Commonly known as:  LANTUS SOLOSTAR  Inject 50 Units into the skin every morning.        Allergies: No Known Allergies  Past Medical History  Diagnosis Date  . Diabetes mellitus without complication (HCC)     Type 2  . Hypertension   . DKA (diabetic ketoacidoses) (HCC)   . Anxiety   . Depression   . Sleep apnea   . Heart murmur     No past surgical history on file.  Family History  Problem Relation Age of Onset  . Diabetes Father   . Hypertension Father   . Diabetes Brother   . Hypertension Mother   . Hypertension Maternal Grandmother     Social History:  reports that he has never smoked. He has never used smokeless tobacco. He reports that he drinks alcohol. He reports that he does not use illicit drugs.    Review of Systems    Lipid history: Has not been on any statin drugs, has  high triglycerides  He is  taking 54 mg of fenofibrate    Lab Results  Component Value Date   CHOL 184 04/21/2014   HDL 37 04/21/2014   TRIG 481* 04/21/2014          Most recent eye exam was 10/15, he thinks he has been followed for glaucoma  ROS Hypertension: Treated with lisinopril HCT , has been hypertensive for 6 years, followed by PCP  Neurological:   The last foot  exam normal in 11/15 Has no numbness but has  some tingling in feet     LABS:  No visits with results within 1 Week(s) from this visit. Latest known visit with results is:  Orders Only on 02/15/2015  Component Date Value Ref Range Status  . Hemoglobin A1C 02/15/2015 11.6   Final    Physical Examination:  BP 147/98 mmHg  Pulse 91  Temp(Src) 98.9 F (37.2 C) (Oral)  Ht 6' 1.25" (1.861 m)  Wt 210 lb 6 oz (95.425 kg)  BMI 27.55 kg/m2     ASSESSMENT:  Diabetes type 2, insulin-dependent and persistently uncontrolled     He is still having poor control although his doing somewhat better with his compliance with both basal and bolus insulin He still is not compliant with checking his blood sugars at different times as directed For improving his control and compliance he is interested in starting the  Omnipod pump which he now has. However he does not understand much about how this works and what he will have to do to control his diabetes with the pump  Discussed in detail how the pump would work and deliver both basal and bolus insulin. He will have to check his blood sugar with every meal and also started learning carbohydrate counting He is still agreeable to trying the pump  PLAN:   He will be scheduled to see the nurse educator for detailed education regarding the Omnipod pump as well as day-to-day management of diabetes  Discussed importance of checking blood sugars at various times today at least 2 or 3 times a day including after meals  Most likely since he is not getting 24 action of Lantus he will try to take this at breakfast and supper in 2 divided doses  Again adjust Novolog based on meal size   Follow-up with PCP for hypertension  He needs to have fasting lipids checked by his PCP  Patient Instructions  No Lantus when starting Omnipod  Meanwhile change lantus to 25 twice sdaily  Must check sugar at meals and bedtime   Counseling time on subjects discussed above is over 50% of today's 25 minute  visit  Craig Johnson 04/30/2015, 9:19 PM   Note: This office note was prepared with Dragon voice recognition system technology. Any transcriptional errors that result from this process are unintentional.

## 2015-04-29 NOTE — Patient Instructions (Signed)
No Lantus when starting Omnipod  Meanwhile change lantus to 25 twice sdaily  Must check sugar at meals and bedtime

## 2015-05-07 ENCOUNTER — Ambulatory Visit (INDEPENDENT_AMBULATORY_CARE_PROVIDER_SITE_OTHER): Payer: Federal, State, Local not specified - PPO

## 2015-05-07 DIAGNOSIS — Z23 Encounter for immunization: Secondary | ICD-10-CM

## 2015-05-14 ENCOUNTER — Encounter: Payer: Federal, State, Local not specified - PPO | Attending: Endocrinology | Admitting: Nutrition

## 2015-05-14 DIAGNOSIS — E119 Type 2 diabetes mellitus without complications: Secondary | ICD-10-CM | POA: Insufficient documentation

## 2015-05-14 DIAGNOSIS — Z713 Dietary counseling and surveillance: Secondary | ICD-10-CM | POA: Insufficient documentation

## 2015-05-14 DIAGNOSIS — Z794 Long term (current) use of insulin: Secondary | ICD-10-CM | POA: Insufficient documentation

## 2015-05-14 NOTE — Progress Notes (Signed)
We reviewed how this pump works, the difference between basal and bolus insulin, and set up his PDM with date/time/bolus and basal settings.  He did not attach the pump because Dr. Lucianne MussKumar is gone this afternoon, and since he has taken his Lantus this AM, he will be ok through today. HE reports having an appt. With Dr. Lucianne MussKumar tomorrow. We reviewed carb counting, and he reported good understanding of this.  He was given handouts on 15 gram portions sizes of most carbs, and a brochure on estimating portion sizes.  He had no final questions about this.  He was told to not take his Lantus tonight, nor in the AM tomorrow morning.  He reported good understanding of this and will only take his Novolog before breakfast tomorrow.

## 2015-05-15 ENCOUNTER — Encounter: Payer: Federal, State, Local not specified - PPO | Admitting: Nutrition

## 2015-05-15 ENCOUNTER — Ambulatory Visit: Payer: Federal, State, Local not specified - PPO | Admitting: Endocrinology

## 2015-05-15 DIAGNOSIS — E119 Type 2 diabetes mellitus without complications: Secondary | ICD-10-CM

## 2015-05-15 NOTE — Patient Instructions (Signed)
StopToujeo Sunday night, and no Toujeo Monday morning. Take Novolog and eat breakfast Monday morning before coming in

## 2015-05-15 NOTE — Progress Notes (Signed)
Reviewed what we had discussed yesterday. He reported to have reviewed the manual, when into the food library, and was able to count the carbs correctly for lunch and dinner yesterday.   We discussed when and how to use the temp basal rate, and he re demonstrated how to give a bolus successfully Dr. Lucianne MussKumar was not able to see him today, so, he was given a Toujeo pen and a Novolog pen to continue this until next Monday.   He had no final questions.

## 2015-05-15 NOTE — Patient Instructions (Signed)
Please read over manual and look over [employer] list of foods in the PDM. Calculate meal time carbs today to review with me tomorrow.

## 2015-05-16 ENCOUNTER — Ambulatory Visit: Payer: Federal, State, Local not specified - PPO | Admitting: Endocrinology

## 2015-05-17 ENCOUNTER — Ambulatory Visit: Payer: Federal, State, Local not specified - PPO | Admitting: Endocrinology

## 2015-05-20 ENCOUNTER — Encounter: Payer: Federal, State, Local not specified - PPO | Admitting: Nutrition

## 2015-05-20 ENCOUNTER — Ambulatory Visit: Payer: Federal, State, Local not specified - PPO | Admitting: Endocrinology

## 2015-05-21 ENCOUNTER — Encounter: Payer: Federal, State, Local not specified - PPO | Admitting: Nutrition

## 2015-05-21 ENCOUNTER — Ambulatory Visit (INDEPENDENT_AMBULATORY_CARE_PROVIDER_SITE_OTHER): Payer: Federal, State, Local not specified - PPO | Admitting: Endocrinology

## 2015-05-21 DIAGNOSIS — E1165 Type 2 diabetes mellitus with hyperglycemia: Secondary | ICD-10-CM | POA: Diagnosis not present

## 2015-05-21 DIAGNOSIS — Z794 Long term (current) use of insulin: Secondary | ICD-10-CM

## 2015-05-21 NOTE — Patient Instructions (Signed)
Continue to test blood sugars before and 2 hours after meals, bedtime and 3AM.  Return tomorrow for followup

## 2015-05-21 NOTE — Progress Notes (Signed)
Patient ID: Craig Johnson, male   DOB: 02/12/1971, 44 y.o.   MRN: [id number]           Reason for Appointment: Follow-up for Type 2 Diabetes  Referring physician: None  History of Present Illness:          Date of diagnosis of type 2 diabetes mellitus: 2013       Background history:  He probably had significantly high blood sugars at time of diagnosis but details are not available. He thinks he was tried on various oral medications initially and these did not improve his blood sugar (Metformin, Januvia, Farxiga) Probably was started on insulin about 3 months after diagnosis Apparently was on as much as 60 units of Lantus daily.   Recent history:  He is being started on an insulin pump with the Omnipod as of 05/20/15 PUMP settings: Basal rate = 1.4, carbohydrate ratio of 1:8 and correction factor 1:35 with target 120  His blood sugars had been consistently poorly controlled over the last 2 years as judged by his A1c levels of around 12% Because of poor control with basal bolus insulin injections and his desire to not use multiple injections he was started on insulin pump Prior to starting the pump blood sugars are mostly averaging about 200 and he was not checking blood sugars consistently throughout the day   Current blood sugar patterns and problems identified:  Glucose at lunchtime = 237, two-hour postprandial 189  Before dinner 225, postprandial 180 with a 10.5 minute bolus.  Bedtime and overnight blood sugars 237 and 210 and patient took correction boluses  Blood sugar at 7 AM = 153  He is now able to count carbohydrates and has been entering them in his pump.  Carbohydrate intake since yesterday has been ranging from 38-90 g per meal   Oral hypoglycemic drugs the patient is taking are: None      Side effects from medications have been: None  Compliance with the medical regimen: Fair  Hypoglycemia:   none  Glucose monitoring:  about once a day            Glucometer:  One Touch     Blood Glucose readings by time of day and averages from meter download as above:  Self-care:    Previous diabetes education: Only at diagnosis time Diet: He has variable intake and variable times when eating lunch, occasionally will skip lunch               Exercise: Treadmill 0-3/7 days a week in the evenings   Weight history:   Wt Readings from Last 3 Encounters:  04/29/15 210 lb 6 oz (95.425 kg)  03/18/15 205 lb (92.987 kg)  03/05/15 206 lb 6.4 oz (93.622 kg)    Glycemic control:   Lab Results  Component Value Date   HGBA1C 11.6 02/15/2015   HGBA1C 12.3* 05/16/2014   HGBA1C 11.9* 08/21/2013   Lab Results  Component Value Date   MICROALBUR 3.4* 02/15/2015   CREATININE 1.46 02/15/2015         Medication List       This list is accurate as of: 05/21/15  8:13 AM.  Always use your most recent med list.               amLODipine 10 MG tablet  Commonly known as:  NORVASC  TAKE 1 TABLET (10 MG TOTAL) BY MOUTH DAILY.     amLODipine 10 MG tablet  Commonly known as:  NORVASC  TAKE 1 TABLET BY MOUTH EVERY DAY     fenofibrate 145 MG tablet  Commonly known as:  TRICOR  Take 1 tablet (145 mg total) by mouth daily.     fenofibrate 54 MG tablet  TAKE 1 TABLET (54 MG TOTAL) BY MOUTH DAILY. **NEED APPOINTMENT     glucose blood test strip  Commonly known as:  ONETOUCH VERIO  Use as instructed to check blood sugar 3 times per day dx code E11.9     insulin aspart 100 UNIT/ML FlexPen  Commonly known as:  NOVOLOG FLEXPEN  Inject up to 30 units three times a day     Insulin Degludec 200 UNIT/ML Sopn  Commonly known as:  TRESIBA FLEXTOUCH  Inject 50 Units into the skin daily.     Insulin Glargine 100 UNIT/ML Solostar Pen  Commonly known as:  LANTUS SOLOSTAR  Inject 50 Units into the skin every morning.     Insulin Pen Needle 32G X 4 MM Misc  Commonly known as:  BD PEN NEEDLE NANO U/F  Use for insulin injections 4 times daily.      lisinopril-hydrochlorothiazide 20-25 MG tablet  Commonly known as:  PRINZIDE,ZESTORETIC  Take 1 tablet by mouth daily.     metFORMIN 750 MG 24 hr tablet  Commonly known as:  GLUCOPHAGE-XR  Take 1 tablet (750 mg total) by mouth daily with breakfast.     ONETOUCH DELICA LANCETS 33G Misc  Use to check blood sugar 3 time a day dx code E11.9     potassium chloride SA 20 MEQ tablet  Commonly known as:  K-DUR,KLOR-CON  Take 1 tablet (20 mEq total) by mouth daily.        Allergies: No Known Allergies  Past Medical History  Diagnosis Date  . Diabetes mellitus without complication (HCC)     Type 2  . Hypertension   . DKA (diabetic ketoacidoses) (HCC)   . Anxiety   . Depression   . Sleep apnea   . Heart murmur     No past surgical history on file.  Family History  Problem Relation Age of Onset  . Diabetes Father   . Hypertension Father   . Diabetes Brother   . Hypertension Mother   . Hypertension Maternal Grandmother     Social History:  reports that he has never smoked. He has never used smokeless tobacco. He reports that he drinks alcohol. He reports that he does not use illicit drugs.    Review of Systems    Lipid history: Has not been on any statin drugs, has  high triglycerides  He is  taking 54 mg of fenofibrate    Lab Results  Component Value Date   CHOL 184 04/21/2014   HDL 37 04/21/2014   TRIG 481* 04/21/2014          Most recent eye exam was 10/15, he thinks he has been followed for glaucoma  ROS Hypertension: Treated with lisinopril HCT , has been hypertensive for 6 years, followed by PCP  Neurological:    Yesterday around 7 PM he had sensation of tingling down his left arm with coldness in the 2 fingers only for 30 minutes.  Did not have any weakness in the arm and feels fine now, no previous history of cervical disc disease  The last foot exam normal in 11/15 Has no numbness but has some tingling in feet     LABS:  No visits with results  within 1 Week(s) from this visit. Latest  known visit with results is:  Orders Only on 02/15/2015  Component Date Value Ref Range Status  . Hemoglobin A1C 02/15/2015 11.6   Final    Physical Examination:  There were no vitals taken for this visit.     ASSESSMENT:  Diabetes type 2, insulin-dependent and persistently uncontrolled     He is doing fairly well so far with starting the pump yesterday Basal rate has been started at a low level of 1.4, previously on 60 units of Lantus daily His postmeal blood sugars are somewhat better than pre-meal   PLAN:   He will  increase his basal rate to 1.5 overnight and 1.7 during the day    review blood sugars again tomorrow  Continue education around pump management today with nurse educator   he will discuss any further left arm tingling symptoms with PCP  Patient Instructions  Basal rate = 1.5, MN until 6 am  6 am 1.7     Uva Runkel 05/21/2015, 8:13 AM   Note: This office note was prepared with Dragon voice recognition system technology. Any transcriptional errors that result from this process are unintentional.

## 2015-05-21 NOTE — Patient Instructions (Signed)
Basal rate = 1.5, MN until 6 am  6 am 1.7

## 2015-05-21 NOTE — Progress Notes (Signed)
[name] was able to change the settings of his basal rate without any help from me.   We reviewed how and when to use the combination bolus and the temp basal rate.  He re demonstrated both of these and had no questions.   We will review high blood sugar protocols and sick day guidelines tomorrow, and sign the understanding sheet of all topics tomorrow

## 2015-05-21 NOTE — Patient Instructions (Signed)
Read over manual on how to give boluses and change pump settings. Return tomorrow at 8AM to see Dr. Lucianne MussKumar and myself

## 2015-05-21 NOTE — Progress Notes (Signed)
Pt. Had taken no Lantus last night, nor this AM.  Pump was started at 12Noon.  He put in the carb and basal settings per Dr. Remus BlakeKumar's orders.  Basal rate:1.4u hr, bolus settings:  I/C: 8, ISF: 35, target: 120-120, correction over 120, timing 4 hours.   Pt. Needed very little assistance from me in doing this.   We filled a pod with Novolog insulin and he attached it to his left upper arm by following the directions on the PDM, without any difficulty.  He did a correction bolus, because his blood sugar was 289, and reported good understanding of this.   We reviewed how to give a bolus, and he did this without any hesitation.  He did not want me to call him this evening, and had no final questions.

## 2015-05-22 ENCOUNTER — Ambulatory Visit (INDEPENDENT_AMBULATORY_CARE_PROVIDER_SITE_OTHER): Payer: Federal, State, Local not specified - PPO | Admitting: Endocrinology

## 2015-05-22 ENCOUNTER — Encounter: Payer: Federal, State, Local not specified - PPO | Admitting: Nutrition

## 2015-05-22 ENCOUNTER — Other Ambulatory Visit: Payer: Self-pay | Admitting: *Deleted

## 2015-05-22 DIAGNOSIS — Z794 Long term (current) use of insulin: Secondary | ICD-10-CM

## 2015-05-22 DIAGNOSIS — E1165 Type 2 diabetes mellitus with hyperglycemia: Secondary | ICD-10-CM | POA: Diagnosis not present

## 2015-05-22 DIAGNOSIS — Z713 Dietary counseling and surveillance: Secondary | ICD-10-CM | POA: Diagnosis not present

## 2015-05-22 DIAGNOSIS — E119 Type 2 diabetes mellitus without complications: Secondary | ICD-10-CM | POA: Diagnosis not present

## 2015-05-22 MED ORDER — FREESTYLE LANCETS MISC: Status: DC

## 2015-05-22 MED ORDER — GLUCOSE BLOOD VI STRP: ORAL_STRIP | Status: DC

## 2015-05-22 NOTE — Patient Instructions (Signed)
Call Blood sugars on Monday. Test before meals and at bedtime.

## 2015-05-22 NOTE — Patient Instructions (Signed)
Changes as given

## 2015-05-22 NOTE — Progress Notes (Signed)
Pt reported no difficulty wearing or using the pump.  Blood sugars were high and Dr. Lucianne MussKumar made changes to the pump settings.  He required very little assistance from me to make the changes to the basal rates.   He did need help in making changes to his I/C ratio.  He is doing now: 7acB, 8acL and 7acS.   We reviewed sick day guidelines and high blood sugar protocols and he reported good understanding of this.  He was given handouts on the above, as well as one what to carry with him at all times, low blood sugar protocols, and site rotation schedule.   He had no final questions and signed off on understanding all topics.

## 2015-05-22 NOTE — Progress Notes (Signed)
Patient ID: Craig KingfisherCalvin Johnson, male   DOB: 1971-02-05, 44 y.o.   MRN: [id number]           Reason for Appointment: Follow-up for Type 2 Diabetes  Referring physician: None  History of Present Illness:          Date of diagnosis of type 2 diabetes mellitus: 2013       Background history:  He probably had significantly high blood sugars at time of diagnosis but details are not available. He thinks he was tried on various oral medications initially and these did not improve his blood sugar (Metformin, Januvia, Farxiga) Probably was started on insulin about 3 months after diagnosis Apparently was on as much as 60 units of Lantus daily.   Recent history:  He was started on an insulin pump with the Omnipod as of 05/20/15 PUMP settings: Basal rate = 1.6 until 6 AM and then 1.7, carbohydrate ratio of 1:8 and correction factor 1:35 with target 120  His blood sugars had been consistently poorly controlled over the last 2 years as judged by his A1c levels of around 12% Because of poor control with basal bolus insulin injections and his desire to not use multiple injections he was started on insulin pump Prior to starting the pump blood sugars are mostly averaging about 200 and he was not checking blood sugars consistently throughout the day  His basal rate was increased from the starting rate of 1.6 but boluses continued unchanged  Current blood sugar patterns since yesterday  Glucose at lunchtime = 152 , two-hour postprandial 169  Before dinner 213, postprandial 309 with a 8.7 unit bolus.  Bedtime and overnight blood sugars 230 and 175 and patient took correction boluses both times  Blood sugar at 7 AM = 208 and 2 hour glucose after cereal was 300 today  Total carbohydrate intake yesterday was 178 g  Oral hypoglycemic drugs the patient is taking are: None      Side effects from medications have been: None Compliance with the medical regimen: Improved  Hypoglycemia:   none  Glucose  monitoring:  about once a day           Glucometer:  Freestyle    Blood Glucose readings by time of day and averages from meter download as above:  Self-care:    Previous diabetes education: Only at diagnosis time Diet: He has variable intake and variable times when eating lunch, occasionally will skip lunch               Exercise: Treadmill 0-3/7 days a week in the evenings   Weight history:   Wt Readings from Last 3 Encounters:  04/29/15 210 lb 6 oz (95.425 kg)  03/18/15 205 lb (92.987 kg)  03/05/15 206 lb 6.4 oz (93.622 kg)    Glycemic control:   Lab Results  Component Value Date   HGBA1C 11.6 02/15/2015   HGBA1C 12.3* 05/16/2014   HGBA1C 11.9* 08/21/2013   Lab Results  Component Value Date   MICROALBUR 3.4* 02/15/2015   CREATININE 1.46 02/15/2015         Medication List       This list is accurate as of: 05/22/15 12:55 PM.  Always use your most recent med list.               amLODipine 10 MG tablet  Commonly known as:  NORVASC  TAKE 1 TABLET (10 MG TOTAL) BY MOUTH DAILY.     amLODipine 10 MG tablet  Commonly known as:  NORVASC  TAKE 1 TABLET BY MOUTH EVERY DAY     fenofibrate 145 MG tablet  Commonly known as:  TRICOR  Take 1 tablet (145 mg total) by mouth daily.     fenofibrate 54 MG tablet  TAKE 1 TABLET (54 MG TOTAL) BY MOUTH DAILY. **NEED APPOINTMENT     glucose blood test strip  Commonly known as:  ONETOUCH VERIO  Use as instructed to check blood sugar 3 times per day dx code E11.9     Insulin Pen Needle 32G X 4 MM Misc  Commonly known as:  BD PEN NEEDLE NANO U/F  Use for insulin injections 4 times daily.     lisinopril-hydrochlorothiazide 20-25 MG tablet  Commonly known as:  PRINZIDE,ZESTORETIC  Take 1 tablet by mouth daily.     metFORMIN 750 MG 24 hr tablet  Commonly known as:  GLUCOPHAGE-XR  Take 1 tablet (750 mg total) by mouth daily with breakfast.     ONETOUCH DELICA LANCETS 33G Misc  Use to check blood sugar 3 time a day dx  code E11.9     potassium chloride SA 20 MEQ tablet  Commonly known as:  K-DUR,KLOR-CON  Take 1 tablet (20 mEq total) by mouth daily.        Allergies: No Known Allergies  Past Medical History  Diagnosis Date  . Diabetes mellitus without complication (HCC)     Type 2  . Hypertension   . DKA (diabetic ketoacidoses) (HCC)   . Anxiety   . Depression   . Sleep apnea   . Heart murmur     No past surgical history on file.  Family History  Problem Relation Age of Onset  . Diabetes Father   . Hypertension Father   . Diabetes Brother   . Hypertension Mother   . Hypertension Maternal Grandmother     Social History:  reports that he has never smoked. He has never used smokeless tobacco. He reports that he drinks alcohol. He reports that he does not use illicit drugs.     ROS Hypertension: Treated with lisinopril HCT , has been hypertensive for 6 years, followed by PCP  Lipid history: Has not been on any statin drugs, has  high triglycerides  He is  taking 54 mg of fenofibrate  Lab Results  Component Value Date   CHOL 184 04/21/2014   HDL 37 04/21/2014   TRIG 481* 04/21/2014   The last foot exam normal in 11/15 Has no numbness but has some tingling in feet    Most recent eye exam was 10/15, he thinks he has been followed for glaucoma  LABS:  No visits with results within 1 Week(s) from this visit. Latest known visit with results is:  Orders Only on 02/15/2015  Component Date Value Ref Range Status  . Hemoglobin A1C 02/15/2015 11.6   Final    Physical Examination:  There were no vitals taken for this visit.     ASSESSMENT:  Diabetes type 2, insulin-dependent and persistently uncontrolled     He is doing fairly well so far with starting the pump yesterday but blood sugars are still relatively high despite increasing his basal rate slightly His blood sugars are consistently high both before and after meals and also fasting today However he did have a  relatively high fat meal last evening and cereal this morning causing higher postprandial readings  PLAN:   He will  increase his basal rate to 1.8 overnight and 2.0 from  6 AM-10 AM; 10 AM = 1.8, 12:30 PM = 2.1  Boluses 1:7 carbohydrate coverage at breakfast and supper and continue 1:8 at lunch  Improve diet as discussed  Extended boluses for higher fat meals and for holiday meals   review blood sugars again by telephone on Monday  Continue education around pump management today with nurse educator  Patient Instructions  Changes as given    [facility] 05/22/2015, 12:55 PM   Note: This office note was prepared with Dragon voice recognition system technology. Any transcriptional errors that result from this process are unintentional.

## 2015-05-28 ENCOUNTER — Other Ambulatory Visit: Payer: Self-pay | Admitting: *Deleted

## 2015-05-28 MED ORDER — INSULIN ASPART 100 UNIT/ML ~~LOC~~ SOLN: SUBCUTANEOUS | Status: DC

## 2015-05-30 ENCOUNTER — Ambulatory Visit: Payer: Federal, State, Local not specified - PPO | Admitting: Endocrinology

## 2015-05-30 ENCOUNTER — Telehealth: Payer: Self-pay | Admitting: Endocrinology

## 2015-05-30 NOTE — Telephone Encounter (Signed)
Needs to be rescheduled at first available appointment  

## 2015-05-30 NOTE — Telephone Encounter (Signed)
Patient no showed today's appt. Please advise on how to follow up. °A. No follow up necessary. °B. Follow up urgent. Contact patient immediately. °C. Follow up necessary. Contact patient and schedule visit in ___ days. °D. Follow up advised. Contact patient and schedule visit in ____weeks. ° °

## 2015-05-31 ENCOUNTER — Ambulatory Visit (INDEPENDENT_AMBULATORY_CARE_PROVIDER_SITE_OTHER): Payer: Federal, State, Local not specified - PPO | Admitting: Endocrinology

## 2015-05-31 ENCOUNTER — Encounter: Payer: Self-pay | Admitting: Endocrinology

## 2015-05-31 VITALS — BP 150/92 | HR 70 | Temp 98.5°F | Resp 16 | Ht 73.25 in | Wt 210.0 lb

## 2015-05-31 DIAGNOSIS — E1165 Type 2 diabetes mellitus with hyperglycemia: Secondary | ICD-10-CM

## 2015-05-31 DIAGNOSIS — Z794 Long term (current) use of insulin: Secondary | ICD-10-CM

## 2015-05-31 DIAGNOSIS — E785 Hyperlipidemia, unspecified: Secondary | ICD-10-CM

## 2015-05-31 DIAGNOSIS — I10 Essential (primary) hypertension: Secondary | ICD-10-CM

## 2015-05-31 NOTE — Progress Notes (Signed)
Patient ID: Craig KingfisherCalvin Johnson, male   DOB: [birth year], 44 y.o.   MRN: [id number]           Reason for Appointment: Follow-up for Type 2 Diabetes  Referring physician: None  History of Present Illness:          Date of diagnosis of type 2 diabetes mellitus: 2013       Background history:  He probably had significantly high blood sugars at time of diagnosis but details are not available. He thinks he was tried on various oral medications initially and these did not improve his blood sugar (Metformin, Januvia, Farxiga) Probably was started on insulin about 3 months after diagnosis Apparently was on as much as 60 units of Lantus daily.   Recent history:  He was started on an insulin pump with the Omnipod as of 05/20/15  PUMP settings: Basal rate = 1.8 until 6 AM and then 1.7, carbohydrate ratio of 1:8 and correction factor 1:35 with target 120  His blood sugars had been consistently poorly controlled over the last 2 years as judged by his A1c levels of around 12% Because of poor control with basal bolus insulin injections and his desire to not use multiple injections he was started on insulin pump   His basal rate was increased gradually after starting his insulin pump and his blood sugars appear to be improving now  Current blood sugar patterns, management and problems identified since last visit  Glucose has been checked on an average about 5 times a day now  Although his blood sugars have fluctuated they were relatively better last weekend when he was walking more when out of town  FASTING blood sugars are recently around 130-150 range  Does have sporadically high readings midday and in the evenings based on variability in diet, not always covering higher fat meals with extra insulin and has not been doing any extended boluses  He has more recently tried reducing his basal rate temporarily with increased activity but had not tried this with doing his treadmill exercise which she  is doing sporadically only  Hyperglycemia: Has mild increase in blood sugars fasting, overnight and also before and after supper  Yesterday his blood sugar ranged between 129-159 throughout the day and was relatively more active in the afternoon  No expected hypoglycemia  Oral hypoglycemic drugs the patient is taking are: None      Side effects from medications have been: None Compliance with the medical regimen: Improved  Hypoglycemia:   minimal as above, lowest blood sugar in the afternoon last Saturday  Glucose monitoring:             Glucometer:  Freestyle    Blood Glucose readings by time of day and averages from meter download   Mean values apply above for all meters except median for One Touch  PRE-MEAL Fasting Lunch Dinner Bedtime Overall  Glucose range: 129-208 79-288 74-254 122-309 68-309  Mean/median: 148 155 175 180 168+/-54   Overnight glucose readings: 168-271  Self-care:    Previous diabetes education: with CDE with pump start Diet: He has variable intake and variable times when eating lunch, occasionally fried food               Exercise: Treadmill 1-3/7 days a week in the evenings   Weight history:   Wt Readings from Last 3 Encounters:  05/31/15 210 lb (95.255 kg)  04/29/15 210 lb 6 oz (95.425 kg)  03/18/15 205 lb (92.987 kg)  Glycemic control:   Lab Results  Component Value Date   HGBA1C 11.6 02/15/2015   HGBA1C 12.3* 05/16/2014   HGBA1C 11.9* 08/21/2013   Lab Results  Component Value Date   MICROALBUR 3.4* 02/15/2015   CREATININE 1.46 02/15/2015        Medication List       This list is accurate as of: 05/31/15  9:18 AM.  Always use your most recent med list.               amLODipine 10 MG tablet  Commonly known as:  NORVASC  TAKE 1 TABLET (10 MG TOTAL) BY MOUTH DAILY.     amLODipine 10 MG tablet  Commonly known as:  NORVASC  TAKE 1 TABLET BY MOUTH EVERY DAY     fenofibrate 145 MG tablet  Commonly known as:  TRICOR  Take 1  tablet (145 mg total) by mouth daily.     fenofibrate 54 MG tablet  TAKE 1 TABLET (54 MG TOTAL) BY MOUTH DAILY. **NEED APPOINTMENT     freestyle lancets  Use as instructed to check blood sugar 6 times per day dx code E11.65     glucose blood test strip  Commonly known as:  FREESTYLE TEST STRIPS  Use as instructed to check blood sugar 6 times per day Dx code E11.65     insulin aspart 100 UNIT/ML injection  Commonly known as:  novoLOG  Use max 80 units per day with insulin pump     Insulin Pen Needle 32G X 4 MM Misc  Commonly known as:  BD PEN NEEDLE NANO U/F  Use for insulin injections 4 times daily.     lisinopril-hydrochlorothiazide 20-25 MG tablet  Commonly known as:  PRINZIDE,ZESTORETIC  Take 1 tablet by mouth daily.     metFORMIN 750 MG 24 hr tablet  Commonly known as:  GLUCOPHAGE-XR  Take 1 tablet (750 mg total) by mouth daily with breakfast.     potassium chloride SA 20 MEQ tablet  Commonly known as:  K-DUR,KLOR-CON  Take 1 tablet (20 mEq total) by mouth daily.        Allergies: No Known Allergies  Past Medical History  Diagnosis Date  . Diabetes mellitus without complication (HCC)     Type 2  . Hypertension   . DKA (diabetic ketoacidoses) (HCC)   . Anxiety   . Depression   . Sleep apnea   . Heart murmur     No past surgical history on file.  Family History  Problem Relation Age of Onset  . Diabetes Father   . Hypertension Father   . Diabetes Brother   . Hypertension Mother   . Hypertension Maternal Grandmother     Social History:  reports that he has never smoked. He has never used smokeless tobacco. He reports that he drinks alcohol. He reports that he does not use illicit drugs.     ROS  Hypertension: Treated with lisinopril HCT , has been hypertensive for 6 years, followed by PCP He did not take his medication today.  He has a monitor at home but does not check  Lipid history: Has not been on any statin drugs, has  high triglycerides  He  is  taking 54 mg of fenofibrate from his PCP  Lab Results  Component Value Date   CHOL 184 04/21/2014   HDL 37 04/21/2014   TRIG 481* 04/21/2014   The last foot exam normal in 11/15  Most recent eye exam was 10/15,  he thinks he has been followed for glaucoma  LABS:  No visits with results within 1 Week(s) from this visit. Latest known visit with results is:  Orders Only on 02/15/2015  Component Date Value Ref Range Status  . Hemoglobin A1C 02/15/2015 11.6   Final    Physical Examination:  BP 150/92 mmHg  Pulse 70  Temp(Src) 98.5 F (36.9 C)  Resp 16  Ht 6' 1.25" (1.861 m)  Wt 210 lb (95.255 kg)  BMI 27.50 kg/m2  SpO2 97%     ASSESSMENT:  Diabetes type 2, insulin-dependent and persistently uncontrolled     He is doing fairly well so far with starting the Omnipod pump last month With adjusting his basal rates he is getting overall fairly good blood sugars See history of present illness for detailed discussion of his current management, blood sugar patterns and problems identified His blood sugars tend to be slightly high especially overnight or after higher fat meals Postprandial readings are difficult to assess but are generally fairly good without any significant spikes He has exercise less frequently and this appears to generally help his blood sugars  Discussed day-to-day management of his insulin with the pump especially with variability in diet and activity level as well as sick day rules  PLAN:   He will  increase his basal rate to 1.9admit night and 2.2 at 1:30 PM  No change in boluses  Temporary basal as needed  Extended boluses an extra insulin for higher fat meals  Check blood pressure at home consistently  Check fasting blood lipids on the next visit and also take his blood pressure medication when coming here  Patient Instructions  Check Bp weekly  Temp basal for exercise  More bolus+ extend for Hi fat   Counseling time on subjects  discussed above is over 50% of today's 25 minute visit  Odyssey Vasbinder 05/31/2015, 9:18 AM   Note: This office note was prepared with Dragon voice recognition system technology. Any transcriptional errors that result from this process are unintentional.

## 2015-05-31 NOTE — Patient Instructions (Signed)
Check Bp weekly  Temp basal for exercise  More bolus+ extend for Hi fat

## 2015-06-23 ENCOUNTER — Other Ambulatory Visit: Payer: Self-pay | Admitting: Endocrinology

## 2015-07-08 ENCOUNTER — Other Ambulatory Visit: Payer: Federal, State, Local not specified - PPO

## 2015-07-11 ENCOUNTER — Ambulatory Visit (INDEPENDENT_AMBULATORY_CARE_PROVIDER_SITE_OTHER): Payer: Federal, State, Local not specified - PPO | Admitting: Endocrinology

## 2015-07-11 ENCOUNTER — Encounter: Payer: Self-pay | Admitting: Endocrinology

## 2015-07-11 VITALS — BP 140/100 | HR 97 | Temp 98.6°F | Wt 207.2 lb

## 2015-07-11 DIAGNOSIS — I10 Essential (primary) hypertension: Secondary | ICD-10-CM | POA: Diagnosis not present

## 2015-07-11 DIAGNOSIS — Z794 Long term (current) use of insulin: Secondary | ICD-10-CM

## 2015-07-11 DIAGNOSIS — E1165 Type 2 diabetes mellitus with hyperglycemia: Secondary | ICD-10-CM

## 2015-07-11 LAB — BASIC METABOLIC PANEL
BUN: 10 mg/dL (ref 6–23)
CHLORIDE: 102 meq/L (ref 96–112)
CO2: 29 mEq/L (ref 19–32)
CREATININE: 0.91 mg/dL (ref 0.40–1.50)
Calcium: 9.7 mg/dL (ref 8.4–10.5)
GFR: 95.44 mL/min (ref >60.00)
GLUCOSE: 120 mg/dL — AB (ref 70–99)
POTASSIUM: 3.5 meq/L (ref 3.5–5.1)
Sodium: 139 mEq/L (ref 135–145)

## 2015-07-11 LAB — LIPID PANEL
CHOL/HDL RATIO: 5
CHOLESTEROL: 163 mg/dL (ref 0–200)
HDL: 34.6 mg/dL — AB (ref >39.00)
NonHDL: 128.29
TRIGLYCERIDES: 310 mg/dL — AB (ref 0.0–149.0)
VLDL: 62 mg/dL — AB (ref 0.0–40.0)

## 2015-07-11 LAB — HEMOGLOBIN A1C: Hgb A1c MFr Bld: 7.4 % — ABNORMAL HIGH (ref 4.6–6.5)

## 2015-07-11 LAB — LDL CHOLESTEROL, DIRECT: Direct LDL: 86 mg/dL

## 2015-07-11 MED ORDER — CLONIDINE HCL 0.1 MG/24HR TD PTWK: 0.1000 mg | MEDICATED_PATCH | TRANSDERMAL | Status: DC

## 2015-07-11 NOTE — Progress Notes (Signed)
Patient ID: Craig Johnson, male   DOB: [birth year], 45 y.o.   MRN: [id number]           Reason for Appointment: Follow-up for Type 2 Diabetes  Referring physician: None  History of Present Illness:          Date of diagnosis of type 2 diabetes mellitus: 2013       Background history:  He probably had significantly high blood sugars at time of diagnosis but details are not available. He thinks he was tried on various oral medications initially and these did not improve his blood sugar (Metformin, Januvia, Farxiga) Probably was started on insulin about 3 months after diagnosis Apparently was on as much as 60 units of Lantus daily.   Recent history:  He was started on an insulin pump with the Omnipod on 05/20/15  PUMP settings:   Basal rate = 1.9 until 6 AM , 6 AM - 10 AM , 10 AM-2 PM =  1.8 and 2 PM- 12 AM = 2.2   BOLUSES: carbohydrate ratio of 1:7,  Except 1:8 at lunch and correction factor 1:35 with target 120  His blood sugars had been consistently poorly controlled over the last 2 years as judged by his A1c levels of around 12% with  Subcutaneous insulin injections   He has done well with starting the Omnipod pump blood sugars appear to be improving   On his last visit he was having significant variability in his blood sugars even though the average was only 168 His basal rate was increased  Admit not on the last visit  Current blood sugar patterns, management and problems identified since last visit  Glucose has been checked less frequently recently , about 3 times a day    FASTING blood sugars are mostly near normal more recently    He has only a few readings in the afternoons recently and these appear to be fairly good    blood sugars tend to be higher late in the evenings when he checks them    No postprandial readings after breakfast, only reading after lunch was 99   he has only one reading of 194 postprandially otherwise not checking postprandial readings in the  evenings     he appears to be forgetting to  take the bolus for meals occasionally either at lunch  Or supper including last evening   average blood sugar is relatively better on this visit with less variability and appears to have 84% of his blood sugars within the target range  No  Recent hypoglycemia  Oral hypoglycemic drugs the patient is taking are: None      Side effects from medications have been: None Compliance with the medical regimen: Improved  Hypoglycemia:   minimal as above, lowest blood sugar in the afternoon last Saturday  Glucose monitoring:             Glucometer:  Freestyle    Blood Glucose readings by time of day and averages from meter download   Mean values apply above for all meters except median for One Touch  PRE-MEAL Fasting midday 3-9 PM Bedtime Overall  Glucose range: 119-176 99-154 72-269 130-227   Mean/median: 141 131 155 182 151   Self-care:    Previous diabetes education: with CDE with pump start Diet: He has variable intake and variable times when eating lunch, occasionally fried food               Exercise: Treadmill 1-3/7 days a  week in the evenings   Weight history:   Wt Readings from Last 3 Encounters:  07/11/15 207 lb 3.2 oz (93.985 kg)  05/31/15 210 lb (95.255 kg)  04/29/15 210 lb 6 oz (95.425 kg)    Glycemic control:   Lab Results  Component Value Date   HGBA1C 11.6 02/15/2015   HGBA1C 12.3* 05/16/2014   HGBA1C 11.9* 08/21/2013   Lab Results  Component Value Date   MICROALBUR 3.4* 02/15/2015   CREATININE 1.46 02/15/2015        Medication List       This list is accurate as of: 07/11/15 11:52 AM.  Always use your most recent med list.               amLODipine 10 MG tablet  Commonly known as:  NORVASC  TAKE 1 TABLET (10 MG TOTAL) BY MOUTH DAILY.     amLODipine 10 MG tablet  Commonly known as:  NORVASC  TAKE 1 TABLET BY MOUTH EVERY DAY     cloNIDine 0.1 mg/24hr patch  Commonly known as:  CATAPRES - Dosed in  mg/24 hr  Place 1 patch (0.1 mg total) onto the skin once a week.     fenofibrate 54 MG tablet  TAKE 1 TABLET (54 MG TOTAL) BY MOUTH DAILY. **NEED APPOINTMENT     fenofibrate 145 MG tablet  Commonly known as:  TRICOR  TAKE 1 TABLET (145 MG TOTAL) BY MOUTH DAILY.     freestyle lancets  Use as instructed to check blood sugar 6 times per day dx code E11.65     glucose blood test strip  Commonly known as:  FREESTYLE TEST STRIPS  Use as instructed to check blood sugar 6 times per day Dx code E11.65     insulin aspart 100 UNIT/ML injection  Commonly known as:  novoLOG  Use max 80 units per day with insulin pump     Insulin Pen Needle 32G X 4 MM Misc  Commonly known as:  BD PEN NEEDLE NANO U/F  Use for insulin injections 4 times daily.     KLOR-CON M20 20 MEQ tablet  Generic drug:  potassium chloride SA  TAKE 1 TABLET BY MOUTH EVERY DAY     lisinopril-hydrochlorothiazide 20-25 MG tablet  Commonly known as:  PRINZIDE,ZESTORETIC  Take 1 tablet by mouth daily.     metFORMIN 750 MG 24 hr tablet  Commonly known as:  GLUCOPHAGE-XR  Take 1 tablet (750 mg total) by mouth daily with breakfast.        Allergies: No Known Allergies  Past Medical History  Diagnosis Date  . Diabetes mellitus without complication (HCC)     Type 2  . Hypertension   . DKA (diabetic ketoacidoses) (HCC)   . Anxiety   . Depression   . Sleep apnea   . Heart murmur     No past surgical history on file.  Family History  Problem Relation Age of Onset  . Diabetes Father   . Hypertension Father   . Diabetes Brother   . Hypertension Mother   . Hypertension Maternal Grandmother     Social History:  reports that he has never smoked. He has never used smokeless tobacco. He reports that he drinks alcohol. He reports that he does not use illicit drugs.     ROS  Hypertension: Treated with lisinopril HCT and amlodipine, has been hypertensive for 6 years, followed by PCP, noncompliant with  follow-up His blood pressure appears to be consistently high  He has a monitor at home but does not check his reading  Lipid history: Has not been on any statin drugs, has  high triglycerides  He is  taking 54 mg of fenofibrate from his PCP  Lab Results  Component Value Date   CHOL 184 04/21/2014   HDL 37 04/21/2014   TRIG 481* 04/21/2014   The last foot exam normal in 11/15  Most recent eye exam was 10/15, he thinks he has been followed for glaucoma  LABS:  No visits with results within 1 Week(s) from this visit. Latest known visit with results is:  Orders Only on 02/15/2015  Component Date Value Ref Range Status  . Hemoglobin A1C 02/15/2015 11.6   Final    Physical Examination:  BP 140/100 mmHg  Pulse 97  Temp(Src) 98.6 F (37 C) (Oral)  Wt 207 lb 3.2 oz (93.985 kg)     ASSESSMENT:  Diabetes type 2, insulin-dependent and persistently uncontrolled     He is doing fairly well and his level of control is improving the Omnipod pump Also has less variability and better fasting readings compared to the last visit. He has however not been checking his blood sugars as much  See history of present illness for detailed discussion of his current management, blood sugar patterns and problems identified His blood sugars tend to be slightly high in the evenings and late at night Not clear if he is covering his higher fat meals adequately as he does not usually do postprandial readings at night Also with starting exercise he is getting better readings in the afternoons, this does not appear to cause low sugars so far He is also more comfortable with counting carbohydrates Occasionally will forget boluses at certain meals Discussed day-to-day management and glucose monitoring  HYPERTENSION: Poorly controlled with 3 drugs, also requiring potassium supplementation with HCTZ 25 mg  PLAN:   He will  increase his basal rate to 2.35 at 7 PM  No change in boluses  Extended  boluses with extra insulin for higher fat meals  More postprandial readings  Check blood pressure at home consistently  Check fasting blood lipids   Start clonidine patch for hypertension  Follow-up with PCP for hypertension  Patient Instructions  Basal rate 2.35 from 7 pm to MN  More sugars 2 hrs after meals   Counseling time on subjects discussed above is over 50% of today's 25 minute visit  Craig Johnson 07/11/2015, 11:52 AM   Note: This office note was prepared with Insurance underwriter. Any transcriptional errors that result from this process are unintentional.

## 2015-07-11 NOTE — Patient Instructions (Signed)
Basal rate 2.35 from 7 pm to MN  More sugars 2 hrs after meals

## 2015-07-11 NOTE — Progress Notes (Signed)
Pre visit review using our clinic review tool, if applicable. No additional management support is needed unless otherwise documented below in the visit note. 

## 2015-09-04 ENCOUNTER — Other Ambulatory Visit: Payer: Self-pay | Admitting: Endocrinology

## 2015-09-09 ENCOUNTER — Encounter: Payer: Self-pay | Admitting: Endocrinology

## 2015-09-09 ENCOUNTER — Ambulatory Visit (INDEPENDENT_AMBULATORY_CARE_PROVIDER_SITE_OTHER): Payer: Federal, State, Local not specified - PPO | Admitting: Endocrinology

## 2015-09-09 VITALS — BP 146/90 | HR 88 | Temp 98.5°F | Resp 14 | Ht 73.25 in | Wt 207.0 lb

## 2015-09-09 DIAGNOSIS — I10 Essential (primary) hypertension: Secondary | ICD-10-CM

## 2015-09-09 DIAGNOSIS — Z794 Long term (current) use of insulin: Secondary | ICD-10-CM | POA: Diagnosis not present

## 2015-09-09 DIAGNOSIS — E785 Hyperlipidemia, unspecified: Secondary | ICD-10-CM | POA: Diagnosis not present

## 2015-09-09 DIAGNOSIS — E1165 Type 2 diabetes mellitus with hyperglycemia: Secondary | ICD-10-CM | POA: Diagnosis not present

## 2015-09-09 MED ORDER — BISOPROLOL FUMARATE 5 MG PO TABS: 5.0000 mg | ORAL_TABLET | Freq: Every day | ORAL | Status: DC

## 2015-09-09 NOTE — Patient Instructions (Signed)
Check blood sugars on waking up 4-5 times a week Also check blood sugars about 2 hours after a meal and do this after different meals by rotation  Recommended blood sugar levels on waking up is 90-130 and about 2 hours after meal is 130-160  Please bring your blood sugar monitor to each visit, thank you  

## 2015-09-09 NOTE — Progress Notes (Signed)
Patient ID: Craig Johnson, male   DOB: 1970/07/03, 45 y.o.   MRN: [id number]           Reason for Appointment: Follow-up for Type 2 Diabetes  Referring physician: None  History of Present Illness:          Date of diagnosis of type 2 diabetes mellitus: 2013       Background history:  He probably had significantly high blood sugars at time of diagnosis but details are not available. He thinks he was tried on various oral medications initially and these did not improve his blood sugar (Metformin, Januvia, Farxiga) Probably was started on insulin about 3 months after diagnosis Apparently was on as much as 60 units of Lantus daily.   Recent history:  He was started on an insulin pump with the OMNIPOD on 05/20/15  PUMP settings:   Basal rate = 1.9 until 6 AM , 6 AM - 10 AM = 2.0 , 10 AM- 1:30 PM =  1.8 and 1:30 PM- 7 PM = 2.2 and 7 PM = 2.35 Total daily basal rate about 50 units  BOLUSES: carbohydrate ratio of 1:7,  Except 1:8 at lunch and correction factor 1:35 with target 120  His blood sugars had been consistently poorly controlled over the last 2 years as judged by his A1c levels of around 12% with  multiple insulin injections   He has done well with starting the Omnipod pump and blood sugars appear to be consistently better now   On his last visit his evening basal it was increased after 7 PM Last A1c was 7.1  Current blood sugar patterns, management and problems identified since last visit  Glucose has been checked somewhat irregularly although on an average over 2 times a day   FASTING blood sugars are mostly near normal, had a significant high reading only once when he did not have his insulin pump on.  Blood sugars midday and afternoon are variable and irregularly high in the early afternoon either before or after lunch, not clear what factors causes but he may forget to BOLUS at lunchtime occasionally   He has only a few readings around suppertime which are not  usually high  Blood sugars after supper are usually not checked, late evening readings have been variable but generally better than on his last visit  He has a few postprandial readings recorded which are variable, only occasionally high after lunch   Has had only one episode of hypoglycemia possibly from increased physical activity and not using temporary basal rate which she knows how to do  80% of his blood sugars are within target range although he is checking primarily before breakfast and lunch and supper  Oral hypoglycemic drugs the patient is taking are: None      Side effects from medications have been: None Compliance with the medical regimen: Improved  Hypoglycemia:   minimal as above  , lowest blood sugar in the afternoon last Saturday  Glucose monitoring:             Glucometer:  Freestyle    Blood Glucose readings by time of day and averages from meter download   Mean values apply above for all meters except median for One Touch  PRE-MEAL Fasting Midday  3 PM-9 PM  Bedtime Overall  Glucose range: 86-275  89-208  62-260  98-209    Mean/median: 130  23  155  157  44    Self-care:    Previous diabetes  education: with CDE with pump start Diet: He has variable intake and variable times when eating lunch, occasionally fried food               Exercise:  3/7 days a week in the evenings   Weight history:   Wt Readings from Last 3 Encounters:  09/09/15 207 lb (93.895 kg)  07/11/15 207 lb 3.2 oz (93.985 kg)  05/31/15 210 lb (95.255 kg)    Glycemic control:   Lab Results  Component Value Date   HGBA1C 7.4* 07/11/2015   HGBA1C 11.6 02/15/2015   HGBA1C 12.3* 05/16/2014   Lab Results  Component Value Date   MICROALBUR 3.4* 02/15/2015   CREATININE 0.91 07/11/2015        Medication List       This list is accurate as of: 09/09/15  9:21 PM.  Always use your most recent med list.               amLODipine 10 MG tablet  Commonly known as:  NORVASC  TAKE 1  TABLET BY MOUTH EVERY DAY     bisoprolol 5 MG tablet  Commonly known as:  ZEBETA  Take 1 tablet (5 mg total) by mouth daily.     fenofibrate 145 MG tablet  Commonly known as:  TRICOR  TAKE 1 TABLET (145 MG TOTAL) BY MOUTH DAILY.     freestyle lancets  Use as instructed to check blood sugar 6 times per day dx code E11.65     glucose blood test strip  Commonly known as:  FREESTYLE TEST STRIPS  Use as instructed to check blood sugar 6 times per day Dx code E11.65     insulin aspart 100 UNIT/ML injection  Commonly known as:  novoLOG  Use max 80 units per day with insulin pump     Insulin Pen Needle 32G X 4 MM Misc  Commonly known as:  BD PEN NEEDLE NANO U/F  Use for insulin injections 4 times daily.     KLOR-CON M20 20 MEQ tablet  Generic drug:  potassium chloride SA  TAKE 1 TABLET BY MOUTH EVERY DAY     lisinopril-hydrochlorothiazide 20-25 MG tablet  Commonly known as:  PRINZIDE,ZESTORETIC  Take 1 tablet by mouth daily.     metFORMIN 750 MG 24 hr tablet  Commonly known as:  GLUCOPHAGE-XR  Take 1 tablet (750 mg total) by mouth daily with breakfast.        Allergies: No Known Allergies  Past Medical History  Diagnosis Date  . Diabetes mellitus without complication (HCC)     Type 2  . Hypertension   . DKA (diabetic ketoacidoses) (HCC)   . Anxiety   . Depression   . Sleep apnea   . Heart murmur     No past surgical history on file.  Family History  Problem Relation Age of Onset  . Diabetes Father   . Hypertension Father   . Diabetes Brother   . Hypertension Mother   . Hypertension Maternal Grandmother     Social History:  reports that he has never smoked. He has never used smokeless tobacco. He reports that he drinks alcohol. He reports that he does not use illicit drugs.     ROS  Hypertension: Treated with lisinopril HCT and amlodipine, has been hypertensive for 6 years, followed by PCP His blood pressure appears to be consistently high Home Bp  range: 130-140/85-95 He says that with a trial of clonidine pump he did not tolerate  it because of itching that the site of application and stopped using this 2 weeks after initially starting it His office blood pressure was higher the second time even with large cuff He thinks that his pulse rate maybe higher sometimes   Lipid history: Has not been on any statin drugs, has  high triglycerides  He is  taking 145 mg of fenofibrate from his PCP  Lab Results  Component Value Date   CHOL 163 07/11/2015   HDL 34.60* 07/11/2015   LDLDIRECT 86.0 07/11/2015   TRIG 310.0* 07/11/2015   CHOLHDL 5 07/11/2015   The last foot exam normal in 11/15  Most recent eye exam was 10/15, he thinks he has been followed for glaucoma  LABS:  No visits with results within 1 Week(s) from this visit. Latest known visit with results is:  Office Visit on 07/11/2015  Component Date Value Ref Range Status  . Hgb A1c MFr Bld 07/11/2015 7.4* 4.6 - 6.5 % Final   Glycemic Control Guidelines for People with Diabetes:Non Diabetic:  <6%Goal of Therapy: <7%Additional Action Suggested:  >8%   . Sodium 07/11/2015 139  135 - 145 mEq/L Final  . Potassium 07/11/2015 3.5  3.5 - 5.1 mEq/L Final  . Chloride 07/11/2015 102  96 - 112 mEq/L Final  . CO2 07/11/2015 29  19 - 32 mEq/L Final  . Glucose, Bld 07/11/2015 120* 70 - 99 mg/dL Final  . BUN 40/98/1191 10  6 - 23 mg/dL Final  . Creatinine, Ser 07/11/2015 0.91  0.40 - 1.50 mg/dL Final  . Calcium 47/82/9562 9.7  8.4 - 10.5 mg/dL Final  . GFR 13/01/6577 95.44  >60.00 mL/min Final  . Cholesterol 07/11/2015 163  0 - 200 mg/dL Final   ATP III Classification       Desirable:  < 200 mg/dL               Borderline High:  200 - 239 mg/dL          High:  > = 469 mg/dL  . Triglycerides 07/11/2015 310.0* 0.0 - 149.0 mg/dL Final   Normal:  <629 mg/dLBorderline High:  150 - 199 mg/dL  . HDL 07/11/2015 34.60* >39.00 mg/dL Final  . VLDL 52/84/1324 62.0* 0.0 - 40.0 mg/dL Final  . Total  CHOL/HDL Ratio 07/11/2015 5   Final                  Men          Women1/2 Average Risk     3.4          3.3Average Risk          5.0          4.42X Average Risk          9.6          7.13X Average Risk          15.0          11.0                      . NonHDL 07/11/2015 128.29   Final   NOTE:  Non-HDL goal should be 30 mg/dL higher than patient's LDL goal (i.e. LDL goal of < 70 mg/dL, would have non-HDL goal of < 100 mg/dL)  . Direct LDL 07/11/2015 86.0   Final   Optimal:  <100 mg/dLNear or Above Optimal:  100-129 mg/dLBorderline High:  130-159 mg/dLHigh:  160-189 mg/dLVery High:  >190 mg/dL  Physical Examination:  BP 146/90 mmHg  Pulse 88  Temp(Src) 98.5 F (36.9 C)  Resp 14  Ht 6' 1.25" (1.861 m)  Wt 207 lb (93.895 kg)  BMI 27.11 kg/m2  SpO2 97%  Repeat blood pressure with large cuff 150/100 No ankle edema present    ASSESSMENT:  Diabetes type 2, insulin-dependent, now treated with insulin pump See history of present illness for detailed discussion of current diabetes management, blood sugar patterns and problems identified  He is doing fairly well and his level of control is improving with continuing the Omnipod pump Although he is not checking readings after meals and also sporadically during the day his average at home is only 144 No consistent patterns of hyperglycemia seen although tends to have higher readings in the early afternoon Is not very compliant with checking his blood sugars postprandially especially after evening meal Also occasionally may be missing boluses at lunchtime but not clear since he sometimes skips lunch. He is only moderately active and can do better with exercise Also discussed using temporary basal been doing most and is activities to prevent hypoglycemia  HYPERTENSION: Still not controlled with 3 drugs, also requiring potassium supplementation with his HCTZ 25 mg He thinks he did not improve with a trial of Catapres patch  PLAN:   He will  continue the same basal rate  Consider increasing carbohydrate coverage at lunch also  Discussed checking blood sugars after meals more consistently and discussed blood sugar targets after meals  May also consider professional continues glucose monitoring to evaluate further especially if his A1c is higher on his next visit.  Discussed exercise and prevention of hypoglycemia   Trial of BISOPROLOL 5 mg daily in addition to his current regimen for her blood pressure control and control of resting tachycardia  Follow-up in 3 months  Patient Instructions  Check blood sugars on waking up  4-5 times a week Also check blood sugars about 2 hours after a meal and do this after different meals by rotation  Recommended blood sugar levels on waking up is 90-130 and about 2 hours after meal is 130-160  Please bring your blood sugar monitor to each visit, thank you     Counseling time on subjects discussed above is over 50% of today's 25 minute visit  Vamsi Apfel 09/09/2015, 9:21 PM   Note: This office note was prepared with Dragon voice recognition system technology. Any transcriptional errors that result from this process are unintentional.

## 2015-09-12 ENCOUNTER — Ambulatory Visit (INDEPENDENT_AMBULATORY_CARE_PROVIDER_SITE_OTHER): Payer: Federal, State, Local not specified - PPO | Admitting: Nurse Practitioner

## 2015-09-12 ENCOUNTER — Encounter: Payer: Self-pay | Admitting: Nurse Practitioner

## 2015-09-12 VITALS — BP 132/88 | HR 88 | Temp 98.1°F | Wt 205.0 lb

## 2015-09-12 DIAGNOSIS — I10 Essential (primary) hypertension: Secondary | ICD-10-CM

## 2015-09-12 NOTE — Patient Instructions (Addendum)
Make a lab appointment for mid April -nothing to eat or drink except water or black coffee after midnight.   Keep an eye on your toenail discoloration   We will try to see if your insurance will approve tribenzor (new blood pressure med)

## 2015-09-12 NOTE — Progress Notes (Signed)
Pre visit review using our clinic review tool, if applicable. No additional management support is needed unless otherwise documented below in the visit note. 

## 2015-09-12 NOTE — Progress Notes (Signed)
Patient ID: Craig Johnson Cianci, male    DOB: Dec 12, 1970  Age: 45 y.o. MRN: [id number]  CC: Hypertension   HPI Craig Johnson Dugger presents for follow up of HTN.  1) Pt is uncontrolled on lisinopril 20-25 mg, norvasc 10 mg Zebeta- just started today (still pre-hypertensive) Declines to see cardiology   Pt has had pna vaccine in the past he reports  Olmesartan- amlodipine-hctz Tribenzor (not tried before) Pt has been to nutrition therapy classes Pt is up to date on most health maintenance   Toe nail on right foot (great toe) has one section that is hyperpigemented- pt reports it has grown like that for some time  History [name] has a past medical history of Diabetes mellitus without complication (HCC); Hypertension; DKA (diabetic ketoacidoses) (HCC); Anxiety; Depression; Sleep apnea; and Heart murmur.   He has no past surgical history on file.   His family history includes Diabetes in his brother and father; Hypertension in his father, maternal grandmother, and mother.He reports that he has never smoked. He has never used smokeless tobacco. He reports that he drinks alcohol. He reports that he does not use illicit drugs.  Outpatient Prescriptions Prior to Visit  Medication Sig Dispense Refill  . bisoprolol (ZEBETA) 5 MG tablet Take 1 tablet (5 mg total) by mouth daily. 30 tablet 1  . fenofibrate (TRICOR) 145 MG tablet TAKE 1 TABLET (145 MG TOTAL) BY MOUTH DAILY. 30 tablet 3  . glucose blood (FREESTYLE TEST STRIPS) test strip Use as instructed to check blood sugar 6 times per day Dx code E11.65 200 each 3  . Insulin Pen Needle (BD PEN NEEDLE NANO U/F) 32G X 4 MM MISC Use for insulin injections 4 times daily. 120 each 11  . KLOR-CON M20 20 MEQ tablet TAKE 1 TABLET BY MOUTH EVERY DAY 30 tablet 3  . Lancets (FREESTYLE) lancets Use as instructed to check blood sugar 6 times per day dx code E11.65 200 each 3  . lisinopril-hydrochlorothiazide (PRINZIDE,ZESTORETIC) 20-25 MG per tablet Take 1 tablet  by mouth daily. 30 tablet 6  . metFORMIN (GLUCOPHAGE-XR) 750 MG 24 hr tablet Take 1 tablet (750 mg total) by mouth daily with breakfast. 60 tablet 3  . amLODipine (NORVASC) 10 MG tablet TAKE 1 TABLET BY MOUTH EVERY DAY 30 tablet 6  . insulin aspart (NOVOLOG) 100 UNIT/ML injection Use max 80 units per day with insulin pump 3 vial 3   No facility-administered medications prior to visit.    ROS Review of Systems  Constitutional: Negative for fever, chills, diaphoresis and fatigue.  Eyes: Negative for visual disturbance.  Respiratory: Negative for chest tightness, shortness of breath and wheezing.   Cardiovascular: Negative for chest pain, palpitations and leg swelling.  Neurological: Negative for dizziness.    Objective:  BP 132/88 mmHg  Pulse 88  Temp(Src) 98.1 F (36.7 C) (Oral)  Wt 205 lb (92.987 kg)  SpO2 98%  Physical Exam  Constitutional: He is oriented to person, place, and time. He appears well-developed and well-nourished. No distress.  HENT:  Head: Normocephalic and atraumatic.  Right Ear: External ear normal.  Left Ear: External ear normal.  Eyes: EOM are normal. Pupils are equal, round, and reactive to light. Right eye exhibits no discharge. Left eye exhibits no discharge. No scleral icterus.  Cardiovascular: Normal rate, regular rhythm and normal heart sounds.  Exam reveals no gallop and no friction rub.   No murmur heard. Pulmonary/Chest: Effort normal and breath sounds normal. No respiratory distress. He has no wheezes. He  has no rales. He exhibits no tenderness.  Neurological: He is alert and oriented to person, place, and time.  Skin: Skin is warm and dry. No rash noted. He is not diaphoretic.  Right foot great toe medial side is hyperpigmented  Psychiatric: He has a normal mood and affect. His behavior is normal. Judgment and thought content normal.      Assessment & Plan:   There are no diagnoses linked to this encounter. I am having Mr. Almquist  maintain his Insulin Pen Needle, lisinopril-hydrochlorothiazide, metFORMIN, glucose blood, freestyle, KLOR-CON M20, fenofibrate, and bisoprolol.  No orders of the defined types were placed in this encounter.     Follow-up: Return in about 4 weeks (around 10/10/2015) for Lab appointment .

## 2015-09-14 ENCOUNTER — Other Ambulatory Visit: Payer: Self-pay | Admitting: Endocrinology

## 2015-09-14 ENCOUNTER — Other Ambulatory Visit: Payer: Self-pay | Admitting: Nurse Practitioner

## 2015-09-17 ENCOUNTER — Telehealth: Payer: Self-pay | Admitting: Nurse Practitioner

## 2015-09-18 ENCOUNTER — Ambulatory Visit: Payer: Federal, State, Local not specified - PPO | Admitting: Family Medicine

## 2015-09-22 MED ORDER — OLMESARTAN-AMLODIPINE-HCTZ 40-10-12.5 MG PO TABS: 1.0000 | ORAL_TABLET | Freq: Every day | ORAL | Status: DC

## 2015-09-22 NOTE — Assessment & Plan Note (Signed)
BP Readings from Last 3 Encounters:  09/12/15 132/88  09/09/15 146/90  07/11/15 140/100   Pt is non-compliant Started Zebeta today and is already on Norvasc, Lisinopril-HCTZ  Still pre-hypertensive despite regimen (failed individuals)  Sending in for Tribenzor to combine and increase compliance  If PA is started, please let me know so I can assist.

## 2015-10-11 ENCOUNTER — Other Ambulatory Visit: Payer: Self-pay | Admitting: Endocrinology

## 2015-10-14 ENCOUNTER — Other Ambulatory Visit: Payer: Self-pay | Admitting: Nurse Practitioner

## 2015-10-14 ENCOUNTER — Other Ambulatory Visit: Payer: Self-pay | Admitting: Endocrinology

## 2015-10-18 ENCOUNTER — Other Ambulatory Visit: Payer: Federal, State, Local not specified - PPO

## 2015-11-12 ENCOUNTER — Telehealth: Payer: Self-pay | Admitting: Nurse Practitioner

## 2015-11-12 MED ORDER — LISINOPRIL-HYDROCHLOROTHIAZIDE 20-25 MG PO TABS: 1.0000 | ORAL_TABLET | Freq: Every day | ORAL | Status: DC

## 2015-11-12 NOTE — Telephone Encounter (Signed)
Pt called about not having anymore refills for Olmesartan-Amlodipine-HCTZ 40-10-12.5 MG TABS. Pharmacy is [id number] - [address] Call pt @ [phone]. Thank you!

## 2015-11-12 NOTE — Telephone Encounter (Signed)
Refilled for 30days only, spoke with the patient to inform him that he needs to make a follow up appt.

## 2015-11-16 ENCOUNTER — Other Ambulatory Visit: Payer: Self-pay | Admitting: Endocrinology

## 2015-12-09 ENCOUNTER — Ambulatory Visit: Payer: Federal, State, Local not specified - PPO | Admitting: Endocrinology

## 2015-12-09 DIAGNOSIS — Z0289 Encounter for other administrative examinations: Secondary | ICD-10-CM

## 2015-12-23 ENCOUNTER — Encounter: Payer: Self-pay | Admitting: Family Medicine

## 2015-12-23 ENCOUNTER — Ambulatory Visit (INDEPENDENT_AMBULATORY_CARE_PROVIDER_SITE_OTHER): Payer: Federal, State, Local not specified - PPO | Admitting: Family Medicine

## 2015-12-23 VITALS — BP 184/120 | HR 68 | Temp 98.2°F | Ht 73.25 in | Wt 210.5 lb

## 2015-12-23 DIAGNOSIS — E119 Type 2 diabetes mellitus without complications: Secondary | ICD-10-CM

## 2015-12-23 DIAGNOSIS — I10 Essential (primary) hypertension: Secondary | ICD-10-CM | POA: Diagnosis not present

## 2015-12-23 DIAGNOSIS — E785 Hyperlipidemia, unspecified: Secondary | ICD-10-CM

## 2015-12-23 DIAGNOSIS — Z794 Long term (current) use of insulin: Secondary | ICD-10-CM | POA: Diagnosis not present

## 2015-12-23 LAB — COMPREHENSIVE METABOLIC PANEL
ALT: 54 U/L — AB (ref 0–53)
AST: 39 U/L — AB (ref 0–37)
Albumin: 4.6 g/dL (ref 3.5–5.2)
Alkaline Phosphatase: 48 U/L (ref 39–117)
BILIRUBIN TOTAL: 0.6 mg/dL (ref 0.2–1.2)
BUN: 11 mg/dL (ref 6–23)
CO2: 30 meq/L (ref 19–32)
Calcium: 9.7 mg/dL (ref 8.4–10.5)
Chloride: 101 mEq/L (ref 96–112)
Creatinine, Ser: 1.08 mg/dL (ref 0.40–1.50)
GFR: 78.17 mL/min (ref >60.00)
GLUCOSE: 180 mg/dL — AB (ref 70–99)
Potassium: 4.1 mEq/L (ref 3.5–5.1)
SODIUM: 138 meq/L (ref 135–145)
TOTAL PROTEIN: 7.5 g/dL (ref 6.0–8.3)

## 2015-12-23 LAB — HEMOGLOBIN A1C: Hgb A1c MFr Bld: 7.9 % — ABNORMAL HIGH (ref 4.6–6.5)

## 2015-12-23 MED ORDER — OLMESARTAN-AMLODIPINE-HCTZ 40-10-12.5 MG PO TABS: 1.0000 | ORAL_TABLET | Freq: Every day | ORAL | Status: DC

## 2015-12-23 MED ORDER — ATORVASTATIN CALCIUM 40 MG PO TABS: 40.0000 mg | ORAL_TABLET | Freq: Every day | ORAL | Status: DC

## 2015-12-23 NOTE — Progress Notes (Signed)
Subjective:  Patient ID: Craig Johnson, male    DOB: 06-01-1971  Age: 45 y.o. MRN: [id number]  CC: Follow up   HPI:  45 year old male with hypertension, hyperlipidemia, DM 2 presents for follow-up.  Hypertension  Uncontrolled.   There appear to be some issues with miscommunication about medication.  He is post be on a 3 drug combination pill but states that he was only given 30 states apply and is out. He is only currently taking bisoprolol for hypertension.  He states he's recently been experiencing significant headaches which he attributes to his elevated blood pressure.  HLD  Patient with mixed hyperlipidemia.  Not at goal.  It is unclear to me why he is not on a statin given his age and known DM 2.  He is currently taking fenofibrate.  Will discuss adding statin today.  DM-2  Followed by endocrinology.  Last A1c was 7.4 in January.  On insulin pump and metformin.  Social Hx   Social History   Social History  . Marital Status: Married    Spouse Name: N/A  . Number of Children: N/A  . Years of Education: N/A   Social History Main Topics  . Smoking status: Never Smoker   . Smokeless tobacco: Never Used  . Alcohol Use: Yes  . Drug Use: No  . Sexual Activity: Not Asked   Other Topics Concern  . None   Social History Narrative   Review of Systems  Constitutional: Negative.   Neurological: Positive for headaches.   Objective:  BP 184/120 mmHg  Pulse 68  Temp(Src) 98.2 F (36.8 C) (Oral)  Ht 6' 1.25" (1.861 m)  Wt 210 lb 8 oz (95.482 kg)  BMI 27.57 kg/m2  SpO2 97%  BP/Weight 12/23/2015 09/12/2015 9/56/3875  Systolic BP 643 329 518  Diastolic BP 841 88 90  Wt. (Lbs) 210.5 205 207  BMI 27.57 26.85 27.11   Physical Exam  Constitutional: He is oriented to person, place, and time. He appears well-developed. No distress.  Cardiovascular: Normal rate and regular rhythm.   No murmur heard. Pulmonary/Chest: Effort normal. No respiratory  distress. He has no wheezes. He has no rales.  Abdominal: Soft. He exhibits no distension. There is no tenderness.  Neurological: He is alert and oriented to person, place, and time.  Psychiatric: He has a normal mood and affect.  Vitals reviewed.  Lab Results  Component Value Date   WBC 5.1 05/19/2012   HGB 13.2 05/19/2012   HCT 38.2* 05/19/2012   PLT 121* 05/19/2012   GLUCOSE 120* 07/11/2015   CHOL 163 07/11/2015   TRIG 310.0* 07/11/2015   HDL 34.60* 07/11/2015   LDLDIRECT 86.0 07/11/2015   ALT 41 02/15/2015   AST 30 02/15/2015   NA 139 07/11/2015   K 3.5 07/11/2015   CL 102 07/11/2015   CREATININE 0.91 07/11/2015   BUN 10 07/11/2015   CO2 29 07/11/2015   HGBA1C 7.4* 07/11/2015   MICROALBUR 3.4* 02/15/2015   Assessment & Plan:   Problem List Items Addressed This Visit    DM type 2 (diabetes mellitus, type 2) ([place])    A1C today.       Relevant Medications   Olmesartan-Amlodipine-HCTZ 40-10-12.5 MG TABS   atorvastatin (LIPITOR) 40 MG tablet   Other Relevant Orders   HgB A1c   Essential hypertension - Primary    Uncontrolled. Restarting olmesartan/HCTZ/amlodipine today. Continue Bisoprolol. Recheck in 2 weeks.      Relevant Medications   Olmesartan-Amlodipine-HCTZ 40-10-12.5 MG TABS  atorvastatin (LIPITOR) 40 MG tablet   Other Relevant Orders   Comp Met (CMET)   Hyperlipidemia    Not at goal. Starting Lipitor.      Relevant Medications   Olmesartan-Amlodipine-HCTZ 40-10-12.5 MG TABS   atorvastatin (LIPITOR) 40 MG tablet      Meds ordered this encounter  Medications  . Olmesartan-Amlodipine-HCTZ 40-10-12.5 MG TABS    Sig: Take 1 tablet by mouth daily.    Dispense:  90 tablet    Refill:  1    Trying to get 1 tablet on board for better control and compliance  . atorvastatin (LIPITOR) 40 MG tablet    Sig: Take 1 tablet (40 mg total) by mouth daily.    Dispense:  90 tablet    Refill:  1    Follow-up: 2 weeks for a nurse visit for BP check.    [facility]

## 2015-12-23 NOTE — Assessment & Plan Note (Signed)
Not at goal. Starting Lipitor.

## 2015-12-23 NOTE — Patient Instructions (Signed)
Take the medication as prescribed.  Follow up labs in 7-10 days.  Follow up in 2 weeks for a nurse visit for a BP check. Follow up after that pending blood pressure reading.  Take care  Dr. Adriana Simasook

## 2015-12-23 NOTE — Assessment & Plan Note (Signed)
A1C today. 

## 2015-12-23 NOTE — Assessment & Plan Note (Addendum)
Uncontrolled. Restarting olmesartan/HCTZ/amlodipine today. Continue Bisoprolol. Recheck in 2 weeks.

## 2015-12-25 ENCOUNTER — Telehealth: Payer: Self-pay | Admitting: *Deleted

## 2015-12-25 NOTE — Telephone Encounter (Signed)
Patient has requested lab results from 06/26 Pt contact [phone]

## 2015-12-25 NOTE — Telephone Encounter (Signed)
Attempted to return patient's call , not able to leave a message.

## 2015-12-26 NOTE — Telephone Encounter (Signed)
Pt called back regarding imaging results. Thank you!

## 2015-12-26 NOTE — Telephone Encounter (Signed)
CMA spoke with patient and gave results. thanks

## 2016-01-01 ENCOUNTER — Other Ambulatory Visit: Payer: Federal, State, Local not specified - PPO

## 2016-01-07 ENCOUNTER — Telehealth: Payer: Self-pay

## 2016-01-07 ENCOUNTER — Other Ambulatory Visit: Payer: Self-pay | Admitting: Family Medicine

## 2016-01-07 DIAGNOSIS — R945 Abnormal results of liver function studies: Principal | ICD-10-CM

## 2016-01-07 DIAGNOSIS — R7989 Other specified abnormal findings of blood chemistry: Secondary | ICD-10-CM

## 2016-01-07 NOTE — Telephone Encounter (Signed)
Pt coming for labs 01/08/16. Please place future orders. Thank you.  

## 2016-01-08 ENCOUNTER — Other Ambulatory Visit: Payer: Federal, State, Local not specified - PPO

## 2016-01-29 ENCOUNTER — Other Ambulatory Visit: Payer: Self-pay

## 2016-01-29 MED ORDER — INSULIN ASPART 100 UNIT/ML ~~LOC~~ SOLN: SUBCUTANEOUS | 3 refills | Status: DC

## 2016-02-27 ENCOUNTER — Other Ambulatory Visit: Payer: Self-pay | Admitting: Endocrinology

## 2016-03-01 ENCOUNTER — Other Ambulatory Visit: Payer: Self-pay | Admitting: Endocrinology

## 2016-06-12 ENCOUNTER — Telehealth: Payer: Self-pay | Admitting: Endocrinology

## 2016-06-12 NOTE — Telephone Encounter (Signed)
His insurance  Will not pay for any injectable insulin. He is out of insulin. Do you have any samples. Please give him a call.

## 2016-06-16 NOTE — Telephone Encounter (Signed)
Called but patient did not have answering machine set up I will try again later

## 2016-06-16 NOTE — Telephone Encounter (Signed)
[facility] can [facility] [employer] [facility] [employer] [facility] in his pump.  Also needs to be seen in follow-up, is long overdue.  Cannot renew his pump supplies unless he is seen

## 2016-06-17 NOTE — Telephone Encounter (Signed)
Attempted to reach the patient to advise of message, but patient was not available and did not have a working voicemail. Will try again at a later time.  

## 2016-06-17 NOTE — Telephone Encounter (Signed)
Please call and also schedule follow-up

## 2016-06-18 NOTE — Telephone Encounter (Signed)
Attempted to reach the patient to advise of message, but patient was not available and did not have a working Engineer, technical salesvoicemail. Will try again at a later time.

## 2016-06-24 NOTE — Telephone Encounter (Signed)
Attempted to reach the patient to advise of message, but patient was not available and did not have a working voicemail. Will try again at a later time.  

## 2016-06-24 NOTE — Telephone Encounter (Signed)
Attempted to reach the patient to advise of message, but patient was not available and did not have a working Engineer, technical salesvoicemail.

## 2016-07-07 ENCOUNTER — Telehealth: Payer: Self-pay | Admitting: Nurse Practitioner

## 2016-07-07 ENCOUNTER — Ambulatory Visit: Payer: Federal, State, Local not specified - PPO | Admitting: Family Medicine

## 2016-07-07 NOTE — Telephone Encounter (Signed)
FYI

## 2016-07-07 NOTE — Telephone Encounter (Signed)
FYI - Pt called and stated that he has new job training and is going to last longer then expected. Rescheduled for next week.

## 2016-07-13 ENCOUNTER — Encounter: Payer: Self-pay | Admitting: Family Medicine

## 2016-07-13 ENCOUNTER — Ambulatory Visit (INDEPENDENT_AMBULATORY_CARE_PROVIDER_SITE_OTHER): Payer: BLUE CROSS/BLUE SHIELD | Admitting: Family Medicine

## 2016-07-13 VITALS — BP 168/112 | HR 84 | Temp 98.7°F | Resp 14 | Wt 194.8 lb

## 2016-07-13 DIAGNOSIS — Z794 Long term (current) use of insulin: Secondary | ICD-10-CM

## 2016-07-13 DIAGNOSIS — E782 Mixed hyperlipidemia: Secondary | ICD-10-CM | POA: Diagnosis not present

## 2016-07-13 DIAGNOSIS — E119 Type 2 diabetes mellitus without complications: Secondary | ICD-10-CM | POA: Diagnosis not present

## 2016-07-13 DIAGNOSIS — I10 Essential (primary) hypertension: Secondary | ICD-10-CM

## 2016-07-13 MED ORDER — HYDROCHLOROTHIAZIDE 12.5 MG PO CAPS: 12.5000 mg | ORAL_CAPSULE | Freq: Every day | ORAL | 1 refills | Status: DC

## 2016-07-13 MED ORDER — CARVEDILOL 3.125 MG PO TABS: 3.1250 mg | ORAL_TABLET | Freq: Two times a day (BID) | ORAL | 1 refills | Status: DC

## 2016-07-13 NOTE — Assessment & Plan Note (Signed)
A1C today. 

## 2016-07-13 NOTE — Progress Notes (Signed)
Subjective:  Patient ID: Craig Johnson, male    DOB: March 24, 1971  Age: 46 y.o. MRN: [id number]  CC: Follow up  HPI:  [age] year old HTN, HLD, DM-2 presents for follow up.  Hypertension  Uncontrolled.  Has been out of meds until the past 2 weeks due to lack of insurance (just started at the 1st of the year).  Currently on Omelsartan-Amlodipine-HCTZ. Was on Bisoprolol but is not taking.  BP elevated today. He reports elevation at home as well.  HLD  Unsure of control.  Not taking lipitor or tricor at this time. Was previously fairly well controlled on these meds.  DM-2  Followed by Endo.  Has not been seen in months due to lack of insurance.  Currently has his insulin (uses pump).  Needs A1C (unsure of control).  Not taking Metformin.  Social Hx   Social History   Social History  . Marital status: Married    Spouse name: N/A  . Number of children: N/A  . Years of education: N/A   Social History Main Topics  . Smoking status: Never Smoker  . Smokeless tobacco: Never Used  . Alcohol use Yes  . Drug use: No  . Sexual activity: Not Asked   Other Topics Concern  . None   Social History Narrative  . None    Review of Systems  Constitutional: Negative.   Respiratory: Positive for cough.    Objective:  BP (!) 168/112   Pulse 84   Temp 98.7 F (37.1 C) (Oral)   Resp 14   Wt 194 lb 12.8 oz (88.4 kg)   SpO2 96%   BMI 25.53 kg/m   BP/Weight 07/13/2016 12/23/2015 09/12/2015  Systolic BP 168 184 132  Diastolic BP 112 120 88  Wt. (Lbs) 194.8 210.5 205  BMI 25.53 27.57 26.85    Physical Exam  Constitutional: He is oriented to person, place, and time. He appears well-developed. No distress.  Cardiovascular: Normal rate and regular rhythm.   Pulmonary/Chest: Effort normal and breath sounds normal.  Neurological: He is alert and oriented to person, place, and time.  Psychiatric: He has a normal mood and affect.  Vitals reviewed.   Lab Results    Component Value Date   WBC 5.1 05/19/2012   HGB 13.2 05/19/2012   HCT 38.2 (L) 05/19/2012   PLT 121 (L) 05/19/2012   GLUCOSE 180 (H) 12/23/2015   CHOL 163 07/11/2015   TRIG 310.0 (H) 07/11/2015   HDL 34.60 (L) 07/11/2015   LDLDIRECT 86.0 07/11/2015   ALT 54 (H) 12/23/2015   AST 39 (H) 12/23/2015   NA 138 12/23/2015   K 4.1 12/23/2015   CL 101 12/23/2015   CREATININE 1.08 12/23/2015   BUN 11 12/23/2015   CO2 30 12/23/2015   HGBA1C 7.9 (H) 12/23/2015   MICROALBUR 3.4 (H) 02/15/2015    Assessment & Plan:   Problem List Items Addressed This Visit    Hyperlipidemia    Unsure of control. Lipid panel today. Will plan to restart statin and tricor pending lab results.      Relevant Medications   hydrochlorothiazide (MICROZIDE) 12.5 MG capsule   carvedilol (COREG) 3.125 MG tablet   Other Relevant Orders   Lipid panel   Essential hypertension - Primary    Established problem, worsening. Labs today. Continue Omelsartan-Amlodipine-HCTZ. Adding HCTZ 12.5 mg and Coreg 3.125 mg BID. Follow up with Pharmacist, Hazle NordmannKelsy Combs in 2 weeks.      Relevant Medications   hydrochlorothiazide (MICROZIDE)  12.5 MG capsule   carvedilol (COREG) 3.125 MG tablet   Other Relevant Orders   Comprehensive metabolic panel   DM type 2 (diabetes mellitus, type 2) (HCC)    A1C today.      Relevant Orders   CBC   Hemoglobin A1c      Meds ordered this encounter  Medications  . hydrochlorothiazide (MICROZIDE) 12.5 MG capsule    Sig: Take 1 capsule (12.5 mg total) by mouth daily.    Dispense:  90 capsule    Refill:  1  . carvedilol (COREG) 3.125 MG tablet    Sig: Take 1 tablet (3.125 mg total) by mouth 2 (two) times daily with a meal.    Dispense:  180 tablet    Refill:  1    Follow-up: Return in about 2 weeks (around 07/27/2016).  Everlene Other DO [facility]

## 2016-07-13 NOTE — Patient Instructions (Signed)
Labs today.  Meds as we discussed.  Follow up in 2 weeks with our Pharmacist.  Take care  Dr. Adriana Simasook

## 2016-07-13 NOTE — Progress Notes (Signed)
Pre visit review using our clinic review tool, if applicable. No additional management support is needed unless otherwise documented below in the visit note. 

## 2016-07-13 NOTE — Assessment & Plan Note (Signed)
Established problem, worsening. Labs today. Continue Omelsartan-Amlodipine-HCTZ. Adding HCTZ 12.5 mg and Coreg 3.125 mg BID. Follow up with Pharmacist, Hazle NordmannKelsy Combs in 2 weeks.

## 2016-07-13 NOTE — Assessment & Plan Note (Signed)
Unsure of control. Lipid panel today. Will plan to restart statin and tricor pending lab results.

## 2016-07-14 ENCOUNTER — Encounter: Payer: Self-pay | Admitting: Endocrinology

## 2016-07-14 ENCOUNTER — Ambulatory Visit (INDEPENDENT_AMBULATORY_CARE_PROVIDER_SITE_OTHER): Payer: BLUE CROSS/BLUE SHIELD | Admitting: Endocrinology

## 2016-07-14 VITALS — BP 154/100 | Ht 73.0 in | Wt 191.0 lb

## 2016-07-14 DIAGNOSIS — E1165 Type 2 diabetes mellitus with hyperglycemia: Secondary | ICD-10-CM | POA: Diagnosis not present

## 2016-07-14 DIAGNOSIS — I10 Essential (primary) hypertension: Secondary | ICD-10-CM | POA: Diagnosis not present

## 2016-07-14 DIAGNOSIS — R7989 Other specified abnormal findings of blood chemistry: Secondary | ICD-10-CM

## 2016-07-14 DIAGNOSIS — R945 Abnormal results of liver function studies: Secondary | ICD-10-CM

## 2016-07-14 DIAGNOSIS — Z794 Long term (current) use of insulin: Secondary | ICD-10-CM

## 2016-07-14 LAB — COMPREHENSIVE METABOLIC PANEL
ALT: 36 U/L (ref 0–53)
AST: 27 U/L (ref 0–37)
Albumin: 4.5 g/dL (ref 3.5–5.2)
Alkaline Phosphatase: 60 U/L (ref 39–117)
BUN: 15 mg/dL (ref 6–23)
CALCIUM: 9.8 mg/dL (ref 8.4–10.5)
CHLORIDE: 101 meq/L (ref 96–112)
CO2: 25 meq/L (ref 19–32)
Creatinine, Ser: 1.01 mg/dL (ref 0.40–1.50)
GFR: 84.25 mL/min (ref >60.00)
GLUCOSE: 118 mg/dL — AB (ref 70–99)
POTASSIUM: 3.4 meq/L — AB (ref 3.5–5.1)
Sodium: 138 mEq/L (ref 135–145)
Total Bilirubin: 0.3 mg/dL (ref 0.2–1.2)
Total Protein: 7.7 g/dL (ref 6.0–8.3)

## 2016-07-14 LAB — CBC
HCT: 44.1 % (ref 39.0–52.0)
HEMOGLOBIN: 14.9 g/dL (ref 13.0–17.0)
MCHC: 33.9 g/dL (ref 30.0–36.0)
MCV: 89.3 fl (ref 78.0–100.0)
PLATELETS: 212 10*3/uL (ref 150.0–400.0)
RBC: 4.94 Mil/uL (ref 4.22–5.81)
RDW: 12.1 % (ref 11.5–15.5)
WBC: 9.9 10*3/uL (ref 4.0–10.5)

## 2016-07-14 LAB — LDL CHOLESTEROL, DIRECT: Direct LDL: 112 mg/dL

## 2016-07-14 LAB — LIPID PANEL
Cholesterol: 207 mg/dL — ABNORMAL HIGH (ref 0–200)
HDL: 45.5 mg/dL (ref >39.00)
NONHDL: 161.01
TRIGLYCERIDES: 300 mg/dL — AB (ref 0.0–149.0)
Total CHOL/HDL Ratio: 5
VLDL: 60 mg/dL — AB (ref 0.0–40.0)

## 2016-07-14 LAB — HEMOGLOBIN A1C: HEMOGLOBIN A1C: 11.5 % — AB (ref 4.6–6.5)

## 2016-07-14 NOTE — Progress Notes (Signed)
Patient ID: Craig Johnson, male   DOB: 02-25-1971, 46 y.o.   MRN: [id number]           Reason for Appointment: Follow-up for Type 2 Diabetes  Referring physician: None  History of Present Illness:          Date of diagnosis of type 2 diabetes mellitus: 2013       Background history:  He probably had significantly high blood sugars at time of diagnosis but details are not available. He thinks he was tried on various oral medications initially and these did not improve his blood sugar (Metformin, Januvia, Farxiga) Probably was started on insulin about 3 months after diagnosis Apparently was on as much as 60 units of Lantus daily.   He was started on an insulin pump with the OMNIPOD on 05/20/15  Recent history:   Omnipod PUMP settings:   Basal rate = 1.9 until 6 AM , 6 AM - 10 AM = 2.0 , 10 AM- 1:30 PM =  1.8 and 2 PM- 7 PM = 2.2 and 7 PM = 2.35  Total daily basal rate about 50 units  BOLUSES: carbohydrate ratio of 1:7,  Except 1:8 at lunch and correction factor 1:35 with target 120  His blood sugars had been recently poorly controlled because of his running out of insulin for about 6 weeks.   He has not been seen in follow-up for almost a year. He had no insurance and he could not buy his insulin   Last A1c was 7.9  and now it is up to 12.5  Current blood sugar patterns, management and problems identified since last visit  He has blood sugar readings on for about 5 days  Blood sugars are appearing to be fairly good fasting but he thinks they may be low normal.  Most of his readings around lunchtime or height.  Has only one reading at suppertime recently  He generally eating fast food breakfast in the morning but not using an extended bolus for this.  Not clear what his blood sugars are after supper  He is also using another meter to check his sugars but not entering in the pump  He is still entering carbohydrates at the time of boluses  Oral hypoglycemic drugs  the patient is taking are: None      Side effects from medications have been: None Compliance with the medical regimen: Improved  Hypoglycemia:   minimal as above  Glucose monitoring:             Glucometer:  Freestyle    Blood Glucose readings by time of day and averages from meter download   Mean values apply above for all meters except median for One Touch  PRE-MEAL Fasting Lunch Dinner Bedtime Overall  Glucose range: 75-205 213-240 156    Mean/median:        POST-MEAL PC Breakfast PC Lunch PC Dinner  Glucose range:     Mean/median:                       Mean values apply above for all meters except median for One Touch  PRE-MEAL Fasting Midday  3 PM-9 PM  Bedtime Overall  Glucose range: 86-275  89-208  62-260  98-209    Mean/median: 130  23  155  157  44    Self-care:    Previous diabetes education: with CDE with pump start Diet: He has variable intake and variable times when eating lunch,  occasionally fried food               Exercise: active  Weight history:   Wt Readings from Last 3 Encounters:  07/14/16 191 lb (86.6 kg)  07/13/16 194 lb 12.8 oz (88.4 kg)  12/23/15 210 lb 8 oz (95.5 kg)    Glycemic control:   Lab Results  Component Value Date   HGBA1C 11.5 (H) 07/13/2016   HGBA1C 7.9 (H) 12/23/2015   HGBA1C 7.4 (H) 07/11/2015   Lab Results  Component Value Date   MICROALBUR 3.4 (H) 02/15/2015   CREATININE 1.01 07/13/2016      Allergies as of 07/14/2016   No Known Allergies     Medication List       Accurate as of 07/14/16 11:59 PM. Always use your most recent med list.          atorvastatin 40 MG tablet Commonly known as:  LIPITOR Take 1 tablet (40 mg total) by mouth daily.   carvedilol 3.125 MG tablet Commonly known as:  COREG Take 1 tablet (3.125 mg total) by mouth 2 (two) times daily with a meal.   fenofibrate 145 MG tablet Commonly known as:  TRICOR TAKE 1 TABLET (145 MG TOTAL) BY MOUTH DAILY.   freestyle lancets Use as  instructed to check blood sugar 6 times per day dx code E11.65   FREESTYLE TEST STRIPS test strip Generic drug:  glucose blood USE AS INSTRUCTED TO CHECK BLOOD SUGAR 6 TIMES PER DAY DX CODE E11.65   hydrochlorothiazide 12.5 MG capsule Commonly known as:  MICROZIDE Take 1 capsule (12.5 mg total) by mouth daily.   insulin aspart 100 UNIT/ML injection Commonly known as:  NOVOLOG USE MAX 80 UNITS PER DAY WITH INSULIN PUMP   metFORMIN 750 MG 24 hr tablet Commonly known as:  GLUCOPHAGE-XR Take 1 tablet (750 mg total) by mouth daily with breakfast.   Olmesartan-Amlodipine-HCTZ 40-10-12.5 MG Tabs Take 1 tablet by mouth daily.       Allergies: No Known Allergies  Past Medical History:  Diagnosis Date  . Anxiety   . Depression   . Diabetes mellitus without complication (HCC)    Type 2  . DKA (diabetic ketoacidoses) (HCC)   . Heart murmur   . Hypertension   . Sleep apnea     No past surgical history on file.  Family History  Problem Relation Age of Onset  . Diabetes Father   . Hypertension Father   . Diabetes Brother   . Hypertension Mother   . Hypertension Maternal Grandmother     Social History:  reports that he has never smoked. He has never used smokeless tobacco. He reports that he drinks alcohol. He reports that he does not use drugs.     ROS  Hypertension:  followed by PCP His blood pressure has been previously poorly controlled More recently has not been taking his medications and blood pressure is markedly increased. Previously had  hypokalemia with HCTZ Also trial of clonidine patch was unsuccessful because of local itching.  Was seen by PCP yesterday and his blood pressure medications have been changed and taking Benicar/amlodipine and HCTZ now   Lipid history: Has not been on any statin drugs, has  high triglycerides  He is  taking 145 mg of fenofibrate from his PCP  Lab Results  Component Value Date   CHOL 207 (H) 07/13/2016   HDL 45.50  07/13/2016   LDLDIRECT 112.0 07/13/2016   TRIG 300.0 (H) 07/13/2016   CHOLHDL  5 07/13/2016   The last foot exam normal in 3/17  Most recent eye exam was 2017, he thinks he has been followed for glaucoma  LABS:  Office Visit on 07/13/2016  Component Date Value Ref Range Status  . WBC 07/14/2016 9.9  4.0 - 10.5 K/uL Final  . RBC 07/14/2016 4.94  4.22 - 5.81 Mil/uL Final  . Platelets 07/14/2016 212.0  150.0 - 400.0 K/uL Final  . Hemoglobin 07/14/2016 14.9  13.0 - 17.0 g/dL Final  . HCT 16/03/9603 44.1  39.0 - 52.0 % Final  . MCV 07/14/2016 89.3  78.0 - 100.0 fl Final  . MCHC 07/14/2016 33.9  30.0 - 36.0 g/dL Final  . RDW 54/02/8118 12.1  11.5 - 15.5 % Final  . Sodium 07/14/2016 138  135 - 145 mEq/L Final  . Potassium 07/14/2016 3.4* 3.5 - 5.1 mEq/L Final  . Chloride 07/14/2016 101  96 - 112 mEq/L Final  . CO2 07/14/2016 25  19 - 32 mEq/L Final  . Glucose, Bld 07/14/2016 118* 70 - 99 mg/dL Final  . BUN 14/78/2956 15  6 - 23 mg/dL Final  . Creatinine, Ser 07/14/2016 1.01  0.40 - 1.50 mg/dL Final  . Total Bilirubin 07/14/2016 0.3  0.2 - 1.2 mg/dL Final  . Alkaline Phosphatase 07/14/2016 60  39 - 117 U/L Final  . AST 07/14/2016 27  0 - 37 U/L Final  . ALT 07/14/2016 36  0 - 53 U/L Final  . Total Protein 07/14/2016 7.7  6.0 - 8.3 g/dL Final  . Albumin 21/30/8657 4.5  3.5 - 5.2 g/dL Final  . Calcium 84/69/6295 9.8  8.4 - 10.5 mg/dL Final  . GFR 28/41/3244 84.25  >60.00 mL/min Final  . Cholesterol 07/14/2016 207* 0 - 200 mg/dL Final  . Triglycerides 07/14/2016 300.0* 0.0 - 149.0 mg/dL Final  . HDL 07/01/7251 45.50  >39.00 mg/dL Final  . VLDL 66/44/0347 60.0* 0.0 - 40.0 mg/dL Final  . Total CHOL/HDL Ratio 07/14/2016 5   Final  . NonHDL 07/14/2016 161.01   Final  . Hgb A1c MFr Bld 07/14/2016 11.5* 4.6 - 6.5 % Final  . Direct LDL 07/14/2016 112.0  mg/dL Final    Physical Examination:  BP (!) 154/100   Ht 6\' 1"  (1.854 m)   Wt 191 lb (86.6 kg)   SpO2 96%   BMI 25.20 kg/m    Repeat blood pressure with large cuff 150/100 No ankle edema present    ASSESSMENT:  Diabetes type 2, insulin-dependent, now treated with insulin pump See history of present illness for detailed discussion of current diabetes management, blood sugar patterns and problems identified Currently blood sugar control is poor because of his running out of insulin He was previously using his Omnipod with fairly good control although last A1c was still high at 7.9 Not clear what his pump settings and mealtime coverage is should be based on limited data in the last few days. However he appears to have low normal readings fasting and higher readings at lunchtime Also has inadequate monitoring and No postprandial monitoring Discussed day-to-day management of his diabetes   HYPERTENSION: Poorly controlled because of lack of his medications recently Has been recently started on treatment with 3 drugs by PCP    PLAN:   Discussed need for checking blood sugars more consistently at various times.  Need to check blood sugars before and after supper which are not available currently.  He appears to be needing a relatively higher amount of coverage for his  breakfast as blood sugars are high before lunch but also probably needs extended bolus.  He is a good candidate for using the freestyle Libre system since he would have much more information available of his blood sugars at various times including after meals or when he is more active at work.  Explained to him in detail how this would work and he will check on the insurance coverage for this and call us when available.  If he has tendency to hypoglycemia when more active he may need to use a temporary basal  For now since his fasting readings are low normal will reduce his basal rate down to 1.8 at midnight  Also will need to increase his basal rate to 1.9 at 10 AM  He will need to discuss his HYPOKALEMIA with PCP  Follow-up with PCP for  hypertension   Patient Instructions  Check sugar 4x daily   Counseling time on subjects discussed above is over 50% of today's 25 minute visit  Kostantinos Tallman 07/15/2016, 1:29 PM   Note: This office note was prepared with Dragon voice recognition system technology. Any transcriptional errors that result from this process are unintentional.

## 2016-07-14 NOTE — Patient Instructions (Signed)
Check sugar 4x daily 

## 2016-07-17 ENCOUNTER — Telehealth: Payer: Self-pay | Admitting: *Deleted

## 2016-07-17 ENCOUNTER — Other Ambulatory Visit: Payer: Self-pay | Admitting: Family Medicine

## 2016-07-17 MED ORDER — FENOFIBRATE 145 MG PO TABS: ORAL_TABLET | ORAL | 1 refills | Status: DC

## 2016-07-17 MED ORDER — ATORVASTATIN CALCIUM 40 MG PO TABS: 40.0000 mg | ORAL_TABLET | Freq: Every day | ORAL | 1 refills | Status: DC

## 2016-07-17 MED ORDER — METFORMIN HCL ER 750 MG PO TB24: 1500.0000 mg | ORAL_TABLET | Freq: Every day | ORAL | 1 refills | Status: DC

## 2016-07-17 NOTE — Telephone Encounter (Signed)
Pt requested lab results Pt contact [phone]

## 2016-07-17 NOTE — Telephone Encounter (Signed)
Pt called and given lab results  

## 2016-07-19 ENCOUNTER — Other Ambulatory Visit: Payer: Self-pay | Admitting: Family Medicine

## 2016-07-19 DIAGNOSIS — E876 Hypokalemia: Secondary | ICD-10-CM

## 2016-07-24 ENCOUNTER — Other Ambulatory Visit: Payer: BLUE CROSS/BLUE SHIELD

## 2016-07-28 ENCOUNTER — Ambulatory Visit: Payer: BLUE CROSS/BLUE SHIELD | Admitting: Family Medicine

## 2016-08-11 ENCOUNTER — Encounter: Payer: Self-pay | Admitting: Endocrinology

## 2016-08-11 ENCOUNTER — Ambulatory Visit (INDEPENDENT_AMBULATORY_CARE_PROVIDER_SITE_OTHER): Payer: BLUE CROSS/BLUE SHIELD | Admitting: Endocrinology

## 2016-08-11 ENCOUNTER — Other Ambulatory Visit: Payer: Self-pay

## 2016-08-11 VITALS — BP 144/92 | HR 88 | Ht 73.0 in | Wt 202.0 lb

## 2016-08-11 DIAGNOSIS — E1165 Type 2 diabetes mellitus with hyperglycemia: Secondary | ICD-10-CM | POA: Diagnosis not present

## 2016-08-11 DIAGNOSIS — Z794 Long term (current) use of insulin: Secondary | ICD-10-CM

## 2016-08-11 DIAGNOSIS — E782 Mixed hyperlipidemia: Secondary | ICD-10-CM

## 2016-08-11 MED ORDER — FREESTYLE LIBRE SENSOR SYSTEM MISC: 1.0000 | 3 refills | Status: DC

## 2016-08-11 MED ORDER — FREESTYLE LIBRE READER DEVI: 1.0000 | Freq: Once | 0 refills | Status: AC

## 2016-08-11 NOTE — Progress Notes (Signed)
Patient ID: Craig Johnson, male   DOB: 1971-03-10, 46 y.o.   MRN: [id number]           Reason for Appointment: Follow-up for Type 2 Diabetes  Referring physician: None  History of Present Illness:          Date of diagnosis of type 2 diabetes mellitus: 2013       Background history:  He probably had significantly high blood sugars at time of diagnosis but details are not available. He thinks he was tried on various oral medications initially and these did not improve his blood sugar (Metformin, Januvia, Farxiga) Probably was started on insulin about 3 months after diagnosis Apparently was on as much as 60 units of Lantus daily.   He was started on an insulin pump with the OMNIPOD on 05/20/15  Recent history:   Omnipod PUMP settings:   Basal rate = 1.8 until 6 AM , 6 AM - 10 AM = 2.0 , 10 AM- 1:30 PM =  1.8 and 2 PM- 7 PM = 2.2 and 7 PM = 2.35  Total daily basal rate about 50 units  BOLUSES: carbohydrate ratio of 1:7,  Except 1:8 at lunch and correction factor 1:35 with target 120  His blood sugars had been still not adequately controlled because of difficulty getting his supplies for his pump  Last A1c was 12.5  Current blood sugar patterns, management and problems identified since last visit  He has again been irregular with using his pump because of not having adequate supplies which will cause periodically high readings  Also it appears that he is not doing any BOLUSES at times especially in the evening when he is not checking his blood sugar.  He did not know that he can do a bolus without checking the blood sugar  This may sometimes cause high readings the next morning especially if he is eating out  Otherwise if he is able to use the pump and bolus consistently his blood sugars are fairly good within the target range  He has had a couple of hypoglycemic episodes at least one time from a bolus at lunch  He does not think he is getting as much hypoglycemia when he  is more active in the morning at work; he also has tried using a temporary basal doing these periods of activity  He is still entering carbohydrates at the time of boluses  His overnight basal rate was reduced slightly because of tendency to early morning low sugars  Oral hypoglycemic drugs the patient is taking are: None      Side effects from medications have been: None Compliance with the medical regimen: Improved  Hypoglycemia:   occasional as above  Glucose monitoring:             Glucometer:  Freestyle    Blood Glucose readings by time of day and averages from meter download   Mean values apply above for all meters except median for One Touch  PRE-MEAL Morning  Lunch Dinner Bedtime Overall  Glucose range: 75-306  60-330  55-216  76-363    Mean/median: 180  173  139  62  167+/-71    Self-care:    Previous diabetes education: with CDE with pump start Diet: He has variable intake and variable times when eating lunch, occasionally fried food               Exercise: active mostly at work  Weight history:   Wt Readings from Last  3 Encounters:  08/11/16 202 lb (91.6 kg)  07/14/16 191 lb (86.6 kg)  07/13/16 194 lb 12.8 oz (88.4 kg)    Glycemic control:   Lab Results  Component Value Date   HGBA1C 11.5 (H) 07/13/2016   HGBA1C 7.9 (H) 12/23/2015   HGBA1C 7.4 (H) 07/11/2015   Lab Results  Component Value Date   MICROALBUR 3.4 (H) 02/15/2015   CREATININE 1.01 07/13/2016      Allergies as of 08/11/2016   No Known Allergies     Medication List       Accurate as of 08/11/16  9:01 PM. Always use your most recent med list.          atorvastatin 40 MG tablet Commonly known as:  LIPITOR Take 1 tablet (40 mg total) by mouth daily.   carvedilol 3.125 MG tablet Commonly known as:  COREG Take 1 tablet (3.125 mg total) by mouth 2 (two) times daily with a meal.   fenofibrate 145 MG tablet Commonly known as:  TRICOR TAKE 1 TABLET (145 MG TOTAL) BY MOUTH DAILY.     freestyle lancets Use as instructed to check blood sugar 6 times per day dx code E11.65   FREESTYLE LIBRE READER Devi 1 Device by Does not apply route once. NDC- 575-99-0000-21   FREESTYLE LIBRE SENSOR SYSTEM Misc 1 each by Does not apply route continuous. Use 1 sensor every 10 days   FREESTYLE TEST STRIPS test strip Generic drug:  glucose blood USE AS INSTRUCTED TO CHECK BLOOD SUGAR 6 TIMES PER DAY DX CODE E11.65   hydrochlorothiazide 12.5 MG capsule Commonly known as:  MICROZIDE Take 1 capsule (12.5 mg total) by mouth daily.   insulin aspart 100 UNIT/ML injection Commonly known as:  NOVOLOG USE MAX 80 UNITS PER DAY WITH INSULIN PUMP   metFORMIN 750 MG 24 hr tablet Commonly known as:  GLUCOPHAGE-XR Take 2 tablets (1,500 mg total) by mouth daily with breakfast.   Olmesartan-Amlodipine-HCTZ 40-10-12.5 MG Tabs Take 1 tablet by mouth daily.       Allergies: No Known Allergies  Past Medical History:  Diagnosis Date  . Anxiety   . Depression   . Diabetes mellitus without complication (HCC)    Type 2  . DKA (diabetic ketoacidoses) (HCC)   . Heart murmur   . Hypertension   . Sleep apnea     No past surgical history on file.  Family History  Problem Relation Age of Onset  . Diabetes Father   . Hypertension Father   . Diabetes Brother   . Hypertension Mother   . Hypertension Maternal Grandmother     Social History:  reports that he has never smoked. He has never used smokeless tobacco. He reports that he drinks alcohol. He reports that he does not use drugs.     ROS  Hypertension:  followed by PCP His blood pressure has been previously poorly controlled Previously had  hypokalemia with HCTZ Also trial of clonidine patch was unsuccessful because of local itching.  Was Supposed to be seen by PCP recently but did not make his appointment Blood pressure is still relatively high   Lipid history: Has not been on any statin drugs, has  high triglycerides  He  is  taking 145 mg of fenofibrate from his PCP  Lab Results  Component Value Date   CHOL 207 (H) 07/13/2016   HDL 45.50 07/13/2016   LDLDIRECT 112.0 07/13/2016   TRIG 300.0 (H) 07/13/2016   CHOLHDL 5 07/13/2016  The last foot exam normal in 3/17  Most recent eye exam was 2017, he thinks he has been followed for glaucoma  LABS:  No visits with results within 1 Week(s) from this visit.  Latest known visit with results is:  Office Visit on 07/13/2016  Component Date Value Ref Range Status  . WBC 07/13/2016 9.9  4.0 - 10.5 K/uL Final  . RBC 07/13/2016 4.94  4.22 - 5.81 Mil/uL Final  . Platelets 07/13/2016 212.0  150.0 - 400.0 K/uL Final  . Hemoglobin 07/13/2016 14.9  13.0 - 17.0 g/dL Final  . HCT 16/03/9603 44.1  39.0 - 52.0 % Final  . MCV 07/13/2016 89.3  78.0 - 100.0 fl Final  . MCHC 07/13/2016 33.9  30.0 - 36.0 g/dL Final  . RDW 54/02/8118 12.1  11.5 - 15.5 % Final  . Sodium 07/13/2016 138  135 - 145 mEq/L Final  . Potassium 07/13/2016 3.4* 3.5 - 5.1 mEq/L Final  . Chloride 07/13/2016 101  96 - 112 mEq/L Final  . CO2 07/13/2016 25  19 - 32 mEq/L Final  . Glucose, Bld 07/13/2016 118* 70 - 99 mg/dL Final  . BUN 14/78/2956 15  6 - 23 mg/dL Final  . Creatinine, Ser 07/13/2016 1.01  0.40 - 1.50 mg/dL Final  . Total Bilirubin 07/13/2016 0.3  0.2 - 1.2 mg/dL Final  . Alkaline Phosphatase 07/13/2016 60  39 - 117 U/L Final  . AST 07/13/2016 27  0 - 37 U/L Final  . ALT 07/13/2016 36  0 - 53 U/L Final  . Total Protein 07/13/2016 7.7  6.0 - 8.3 g/dL Final  . Albumin 21/30/8657 4.5  3.5 - 5.2 g/dL Final  . Calcium 84/69/6295 9.8  8.4 - 10.5 mg/dL Final  . GFR 28/41/3244 84.25  >60.00 mL/min Final  . Cholesterol 07/13/2016 207* 0 - 200 mg/dL Final  . Triglycerides 07/13/2016 300.0* 0.0 - 149.0 mg/dL Final  . HDL 07/01/7251 45.50  >39.00 mg/dL Final  . VLDL 66/44/0347 60.0* 0.0 - 40.0 mg/dL Final  . Total CHOL/HDL Ratio 07/13/2016 5   Final  . NonHDL 07/13/2016 161.01   Final  . Hgb  A1c MFr Bld 07/13/2016 11.5* 4.6 - 6.5 % Final  . Direct LDL 07/13/2016 112.0  mg/dL Final    Physical Examination:  BP (!) 144/92   Pulse 88   Ht 6\' 1"  (1.854 m)   Wt 202 lb (91.6 kg)   SpO2 97%   BMI 26.65 kg/m      ASSESSMENT:  Diabetes type 2, insulin-dependent, now treated with insulin pump See history of present illness for detailed discussion of current diabetes management, blood sugar patterns and problems identified  Blood sugar control is still erratic but this is mostly because of his not bolusing for meals consistently as he did not understand the need to bolus even without checking his blood sugar Also has had difficulty getting his supplies of the Omnipod and not using it on some days Otherwise blood sugars appear to be generally fairly controlled without any obvious pattern of postprandial hyperglycemia or abnormal fasting readings Also he is trying to do a little better with avoiding hypoglycemia with activity with temporary basal Only occasionally has had low sugars in the early afternoon as discussed above  HYPERTENSION: Not adequately controlled May possibly have hyperaldosteronism, consider further evaluation if blood pressure is not controlled   HYPERLIPIDEMIA: Needs likely a statin drug to help control LDL and further improvement in triglycerides  PLAN:   Discussed use  of the Libre glucose sensor and how this would be used to monitor blood sugar need, this will also help him identify blood sugar patterns as well as help him avoid low blood sugars.  He can use this to do his boluses also when he is not able to check sugar  He needs to bolus for all meals and snacks regardless of whether he has a blood sugar reading or not  Also will have to to consider using dual or extended boluses for higher fat meals  More consistent use of temporary basal when he is more active  Follow-up with PCP for hypertension   There are no Patient Instructions on file for  this visit.  Counseling time on subjects discussed above is over 50% of today's 25 minute visit  Draven Natter 08/11/2016, 9:01 PM   Note: This office note was prepared with Insurance underwriterDragon voice recognition system technology. Any transcriptional errors that result from this process are unintentional.

## 2016-08-12 ENCOUNTER — Telehealth: Payer: Self-pay | Admitting: Endocrinology

## 2016-08-12 NOTE — Telephone Encounter (Signed)
Patient cant get his insulin because he haven't met his deductible, and he is out of pods, and want to know if we have any in the office.  please advise

## 2016-08-13 NOTE — Telephone Encounter (Signed)
jasmine from [facility] calling need a verbal consent concerning patient pods [phone]

## 2016-08-14 NOTE — Telephone Encounter (Signed)
Gave verbal consent 08/14/16

## 2016-08-25 ENCOUNTER — Ambulatory Visit (INDEPENDENT_AMBULATORY_CARE_PROVIDER_SITE_OTHER): Payer: BLUE CROSS/BLUE SHIELD | Admitting: Family Medicine

## 2016-08-25 ENCOUNTER — Encounter: Payer: Self-pay | Admitting: Family Medicine

## 2016-08-25 DIAGNOSIS — E782 Mixed hyperlipidemia: Secondary | ICD-10-CM | POA: Diagnosis not present

## 2016-08-25 DIAGNOSIS — N521 Erectile dysfunction due to diseases classified elsewhere: Secondary | ICD-10-CM | POA: Diagnosis not present

## 2016-08-25 DIAGNOSIS — Z794 Long term (current) use of insulin: Secondary | ICD-10-CM | POA: Diagnosis not present

## 2016-08-25 DIAGNOSIS — E119 Type 2 diabetes mellitus without complications: Secondary | ICD-10-CM | POA: Diagnosis not present

## 2016-08-25 DIAGNOSIS — N529 Male erectile dysfunction, unspecified: Secondary | ICD-10-CM | POA: Insufficient documentation

## 2016-08-25 DIAGNOSIS — I10 Essential (primary) hypertension: Secondary | ICD-10-CM

## 2016-08-25 MED ORDER — SILDENAFIL CITRATE 50 MG PO TABS: ORAL_TABLET | ORAL | 3 refills | Status: DC

## 2016-08-25 MED ORDER — CARVEDILOL 6.25 MG PO TABS: 6.2500 mg | ORAL_TABLET | Freq: Two times a day (BID) | ORAL | 1 refills | Status: DC

## 2016-08-25 NOTE — Assessment & Plan Note (Signed)
Continue statin and fibrate.

## 2016-08-25 NOTE — Assessment & Plan Note (Signed)
New problem. Starting on Viagra.

## 2016-08-25 NOTE — Patient Instructions (Addendum)
Try the viagra. Be sure to use the card.  I have increased the Coreg.  Follow up in 3 months.  Take care  Dr Adriana Simasook

## 2016-08-25 NOTE — Progress Notes (Signed)
Subjective:  Patient ID: Craig Johnson, male    DOB: 08-May-1971  Age: 46 y.o. MRN: [id number]  CC: Follow up  HPI:  46 year old male with hypertension, DM 2, hyperlipidemia presents for follow-up. He has an additional concern today. See below.  DM-2  Sugars improving. No hypoglycemia.  He is following with endocrinology as he has an insulin pump.  He endorses compliance with metformin as well as his insulin regimen.  Hypertension  BP has been stable but slightly elevated at home - 135-138 systolic.  He endorses compliance with his medication, although he missed one of his medications yesterday.  He is taking olmesartan/amlodipine/HCTZ and carvedilol.  Hyperlipidemia  Back on treatment and endorses compliance.  Will need recheck in the near future.  ED  Patient reports that he has been experiencing erectile dysfunction.  He is having trouble obtaining and maintaining erections.  He would like to discuss this today.  Social Hx   Social History   Social History  . Marital status: Married    Spouse name: N/A  . Number of children: N/A  . Years of education: N/A   Social History Main Topics  . Smoking status: Never Smoker  . Smokeless tobacco: Never Used  . Alcohol use Yes  . Drug use: No  . Sexual activity: Not Asked   Other Topics Concern  . None   Social History Narrative  . None    Review of Systems  Constitutional: Negative.   Genitourinary:       Erectile dysfunction   Objective:  BP (!) 148/98 (BP Location: Right Arm, Cuff Size: Normal)   Pulse 90   Temp 98.4 F (36.9 C) (Oral)   Wt 198 lb 9.6 oz (90.1 kg)   SpO2 98%   BMI 26.20 kg/m   BP/Weight 08/25/2016 08/11/2016 07/14/2016  Systolic BP 148 144 154  Diastolic BP 98 92 100  Wt. (Lbs) 198.6 202 191  BMI 26.2 26.65 25.2    Physical Exam  Constitutional: He is oriented to person, place, and time. He appears well-developed. No distress.  Cardiovascular: Normal rate and regular  rhythm.   Pulmonary/Chest: Effort normal and breath sounds normal.  Neurological: He is alert and oriented to person, place, and time.  Psychiatric: He has a normal mood and affect.  Vitals reviewed.   Lab Results  Component Value Date   WBC 9.9 07/13/2016   HGB 14.9 07/13/2016   HCT 44.1 07/13/2016   PLT 212.0 07/13/2016   GLUCOSE 118 (H) 07/13/2016   CHOL 207 (H) 07/13/2016   TRIG 300.0 (H) 07/13/2016   HDL 45.50 07/13/2016   LDLDIRECT 112.0 07/13/2016   ALT 36 07/13/2016   AST 27 07/13/2016   NA 138 07/13/2016   K 3.4 (L) 07/13/2016   CL 101 07/13/2016   CREATININE 1.01 07/13/2016   BUN 15 07/13/2016   CO2 25 07/13/2016   HGBA1C 11.5 (H) 07/13/2016   MICROALBUR 3.4 (H) 02/15/2015    Assessment & Plan:   Problem List Items Addressed This Visit    Hyperlipidemia    Continue statin and fibrate.      Relevant Medications   sildenafil (VIAGRA) 50 MG tablet   carvedilol (COREG) 6.25 MG tablet   Essential hypertension    BP elevated at home and in the clinic today. Increasing Coreg to 6.25 mg twice daily. Continue on omesartan/amlodipine/HCTZ.      Relevant Medications   sildenafil (VIAGRA) 50 MG tablet   carvedilol (COREG) 6.25 MG tablet  Erectile dysfunction    New problem. Starting on Viagra.      DM type 2 (diabetes mellitus, type 2) (HCC)    Sugars improving. Continue to follow with endocrinology regarding management of his insulin pump. Continue metformin.         Meds ordered this encounter  Medications  . sildenafil (VIAGRA) 50 MG tablet    Sig: 1 hour prior to intercourse.    Dispense:  10 tablet    Refill:  3  . carvedilol (COREG) 6.25 MG tablet    Sig: Take 1 tablet (6.25 mg total) by mouth 2 (two) times daily.    Dispense:  180 tablet    Refill:  1    Follow-up: Return in about 3 months (around 11/22/2016).  Everlene Other DO [facility]

## 2016-08-25 NOTE — Progress Notes (Signed)
Pre visit review using our clinic review tool, if applicable. No additional management support is needed unless otherwise documented below in the visit note. 

## 2016-08-25 NOTE — Assessment & Plan Note (Signed)
Sugars improving. Continue to follow with endocrinology regarding management of his insulin pump. Continue metformin.

## 2016-08-25 NOTE — Assessment & Plan Note (Signed)
BP elevated at home and in the clinic today. Increasing Coreg to 6.25 mg twice daily. Continue on omesartan/amlodipine/HCTZ.

## 2016-09-24 ENCOUNTER — Telehealth: Payer: Self-pay | Admitting: *Deleted

## 2016-09-24 NOTE — Telephone Encounter (Signed)
Can patient come in for labs please advise

## 2016-09-24 NOTE — Telephone Encounter (Signed)
Patient requested a call to discuss a needed medication he may need, he does not know the name of the medication.  Pt contact [phone]

## 2016-09-27 NOTE — Telephone Encounter (Signed)
Patient can come in for labs (urine and blood).

## 2016-09-28 ENCOUNTER — Other Ambulatory Visit: Payer: Self-pay | Admitting: Family Medicine

## 2016-09-28 NOTE — Telephone Encounter (Signed)
Patient stated that this issue has been resolved. 

## 2016-09-28 NOTE — Telephone Encounter (Signed)
Please call and schedule patient a lab appointment

## 2016-10-05 ENCOUNTER — Other Ambulatory Visit: Payer: BLUE CROSS/BLUE SHIELD

## 2016-10-08 ENCOUNTER — Ambulatory Visit: Payer: BLUE CROSS/BLUE SHIELD | Admitting: Endocrinology

## 2016-10-20 ENCOUNTER — Other Ambulatory Visit: Payer: Self-pay

## 2016-10-20 MED ORDER — INSULIN LISPRO 100 UNIT/ML ~~LOC~~ SOLN: SUBCUTANEOUS | 0 refills | Status: DC

## 2016-11-24 ENCOUNTER — Ambulatory Visit: Payer: BLUE CROSS/BLUE SHIELD | Admitting: Family Medicine

## 2016-12-03 ENCOUNTER — Encounter: Payer: Self-pay | Admitting: Family Medicine

## 2016-12-03 ENCOUNTER — Ambulatory Visit (INDEPENDENT_AMBULATORY_CARE_PROVIDER_SITE_OTHER): Payer: BLUE CROSS/BLUE SHIELD | Admitting: Family Medicine

## 2016-12-03 VITALS — BP 156/110 | HR 86 | Temp 98.4°F | Resp 16 | Ht 73.0 in | Wt 197.1 lb

## 2016-12-03 DIAGNOSIS — Z794 Long term (current) use of insulin: Secondary | ICD-10-CM | POA: Diagnosis not present

## 2016-12-03 DIAGNOSIS — E782 Mixed hyperlipidemia: Secondary | ICD-10-CM | POA: Diagnosis not present

## 2016-12-03 DIAGNOSIS — Z130 Encounter for screening for diseases of the blood and blood-forming organs and certain disorders involving the immune mechanism: Secondary | ICD-10-CM

## 2016-12-03 DIAGNOSIS — E119 Type 2 diabetes mellitus without complications: Secondary | ICD-10-CM

## 2016-12-03 DIAGNOSIS — I10 Essential (primary) hypertension: Secondary | ICD-10-CM | POA: Diagnosis not present

## 2016-12-03 LAB — CBC
HCT: 43.2 % (ref 39.0–52.0)
Hemoglobin: 14.4 g/dL (ref 13.0–17.0)
MCHC: 33.3 g/dL (ref 30.0–36.0)
MCV: 90.7 fl (ref 78.0–100.0)
PLATELETS: 204 10*3/uL (ref 150.0–400.0)
RBC: 4.76 Mil/uL (ref 4.22–5.81)
RDW: 13.2 % (ref 11.5–15.5)
WBC: 5.4 10*3/uL (ref 4.0–10.5)

## 2016-12-03 LAB — COMPREHENSIVE METABOLIC PANEL
ALT: 25 U/L (ref 0–53)
AST: 22 U/L (ref 0–37)
Albumin: 4.3 g/dL (ref 3.5–5.2)
Alkaline Phosphatase: 37 U/L — ABNORMAL LOW (ref 39–117)
BILIRUBIN TOTAL: 0.6 mg/dL (ref 0.2–1.2)
BUN: 15 mg/dL (ref 6–23)
CALCIUM: 10 mg/dL (ref 8.4–10.5)
CHLORIDE: 105 meq/L (ref 96–112)
CO2: 27 meq/L (ref 19–32)
CREATININE: 1.01 mg/dL (ref 0.40–1.50)
GFR: 84.1 mL/min (ref >60.00)
GLUCOSE: 128 mg/dL — AB (ref 70–99)
Potassium: 3.4 mEq/L — ABNORMAL LOW (ref 3.5–5.1)
Sodium: 141 mEq/L (ref 135–145)
Total Protein: 7.2 g/dL (ref 6.0–8.3)

## 2016-12-03 LAB — LIPID PANEL
CHOL/HDL RATIO: 3
Cholesterol: 130 mg/dL (ref 0–200)
HDL: 49 mg/dL (ref >39.00)
LDL CALC: 42 mg/dL (ref 0–99)
NONHDL: 81.21
TRIGLYCERIDES: 194 mg/dL — AB (ref 0.0–149.0)
VLDL: 38.8 mg/dL (ref 0.0–40.0)

## 2016-12-03 LAB — TSH: TSH: 0.91 u[IU]/mL (ref 0.35–4.50)

## 2016-12-03 LAB — HEMOGLOBIN A1C: HEMOGLOBIN A1C: 8 % — AB (ref 4.6–6.5)

## 2016-12-03 MED ORDER — CARVEDILOL 12.5 MG PO TABS: 12.5000 mg | ORAL_TABLET | Freq: Two times a day (BID) | ORAL | 1 refills | Status: DC

## 2016-12-03 NOTE — Assessment & Plan Note (Signed)
Uncontrolled/worsening. Increasing carvedilol to 12.5 mg twice a day. Continue olmesartan/HCTZ/amlodipine. Follow-up in one month. If BP continues to be elevated, we'll proceed with workup for secondary hypertension

## 2016-12-03 NOTE — Assessment & Plan Note (Signed)
Lipid panel today to assess response to Lipitor. Continue Lipitor.

## 2016-12-03 NOTE — Patient Instructions (Signed)
Coreg increase to 12.5 mg twice daily.  Continue all of your other medications.  Follow up in 1 month.

## 2016-12-03 NOTE — Progress Notes (Signed)
Subjective:  Patient ID: Craig Johnson, male    DOB: 1971/06/29  Age: 46 y.o. MRN: [id number]  CC: Follow up  HPI:  46 year old male with uncontrolled hypertension, DM 2, hyperlipidemia presents for follow-up.  BP elevated today. He endorses that his blood pressure is elevated at home. He is on Olmesartan 40 mg daily, amlodipine 10 mg daily, HCTZ 25 mg daily, and Coreg 6.25 mg twice a day. Endorses compliance. No associated symptoms. He feels well.  In regards to his hyponatremia, he remains on Lipitor 40 mg. Needs lipid panel today. Endorses compliance. No side effects.  Social Hx   Social History   Social History  . Marital status: Married    Spouse name: N/A  . Number of children: N/A  . Years of education: N/A   Social History Main Topics  . Smoking status: Never Smoker  . Smokeless tobacco: Never Used  . Alcohol use Yes  . Drug use: No  . Sexual activity: Not Asked   Other Topics Concern  . None   Social History Narrative  . None    Review of Systems  Respiratory: Negative.   Cardiovascular: Negative.    Objective:  BP (!) 156/110   Pulse 86   Temp 98.4 F (36.9 C) (Oral)   Resp 16   Ht 6\' 1"  (1.854 m)   Wt 197 lb 2 oz (89.4 kg)   SpO2 98%   BMI 26.01 kg/m   BP/Weight 12/03/2016 08/25/2016 08/11/2016  Systolic BP 156 148 144  Diastolic BP 110 98 92  Wt. (Lbs) 197.13 198.6 202  BMI 26.01 26.2 26.65   Physical Exam  Constitutional: He is oriented to person, place, and time. He appears well-developed. No distress.  Cardiovascular: Normal rate and regular rhythm.   Pulmonary/Chest: Effort normal and breath sounds normal. He has no wheezes. He has no rales.  Neurological: He is alert and oriented to person, place, and time.  Psychiatric: He has a normal mood and affect.  Vitals reviewed.   Lab Results  Component Value Date   WBC 9.9 07/13/2016   HGB 14.9 07/13/2016   HCT 44.1 07/13/2016   PLT 212.0 07/13/2016   GLUCOSE 118 (H) 07/13/2016   CHOL 207 (H) 07/13/2016   TRIG 300.0 (H) 07/13/2016   HDL 45.50 07/13/2016   LDLDIRECT 112.0 07/13/2016   ALT 36 07/13/2016   AST 27 07/13/2016   NA 138 07/13/2016   K 3.4 (L) 07/13/2016   CL 101 07/13/2016   CREATININE 1.01 07/13/2016   BUN 15 07/13/2016   CO2 25 07/13/2016   HGBA1C 11.5 (H) 07/13/2016   MICROALBUR 3.4 (H) 02/15/2015    Assessment & Plan:   Problem List Items Addressed This Visit      Cardiovascular and Mediastinum   Uncontrolled hypertension - Primary    Uncontrolled/worsening. Increasing carvedilol to 12.5 mg twice a day. Continue olmesartan/HCTZ/amlodipine. Follow-up in one month. If BP continues to be elevated, we'll proceed with workup for secondary hypertension      Relevant Medications   carvedilol (COREG) 12.5 MG tablet   Other Relevant Orders   TSH     Endocrine   DM type 2 (diabetes mellitus, type 2) (HCC)   Relevant Orders   Comprehensive metabolic panel   Hemoglobin A1c     Other   Hyperlipidemia    Lipid panel today to assess response to Lipitor. Continue Lipitor.      Relevant Medications   carvedilol (COREG) 12.5 MG tablet   Other  Relevant Orders   Lipid panel    Other Visit Diagnoses    Screening for deficiency anemia       Relevant Orders   CBC      Meds ordered this encounter  Medications  . carvedilol (COREG) 12.5 MG tablet    Sig: Take 1 tablet (12.5 mg total) by mouth 2 (two) times daily.    Dispense:  180 tablet    Refill:  1     Follow-up: 1 month  Shaylinn Hladik DO [facility] [place]

## 2017-01-07 ENCOUNTER — Ambulatory Visit: Payer: BLUE CROSS/BLUE SHIELD | Admitting: Endocrinology

## 2017-01-11 ENCOUNTER — Ambulatory Visit: Payer: BLUE CROSS/BLUE SHIELD | Admitting: Family Medicine

## 2017-01-11 DIAGNOSIS — Z0289 Encounter for other administrative examinations: Secondary | ICD-10-CM

## 2017-01-12 NOTE — Progress Notes (Deleted)
Patient ID: Craig Johnson, male   DOB: 11/19/1970, 46 y.o.   MRN: [id number]           Reason for Appointment: Follow-up for Type 2 Diabetes  Referring physician: None  History of Present Illness:          Date of diagnosis of type 2 diabetes mellitus: 2013       Background history:  He probably had significantly high blood sugars at time of diagnosis but details are not available. He thinks he was tried on various oral medications initially and these did not improve his blood sugar (Metformin, Januvia, Farxiga) Probably was started on insulin about 3 months after diagnosis Apparently was on as much as 60 units of Lantus daily.   He was started on an insulin pump with the OMNIPOD on 05/20/15  Recent history:   Omnipod PUMP settings:   Basal rate = 1.8 until 6 AM , 6 AM - 10 AM = 2.0 , 10 AM- 1:30 PM =  1.8 and 2 PM- 7 PM = 2.2 and 7 PM = 2.35  Total daily basal rate about 50 units  BOLUSES: carbohydrate ratio of 1:7,  Except 1:8 at lunch and correction factor 1:35 with target 120  His blood sugars had been still not adequately controlled because of difficulty getting his supplies for his pump  Last A1c was 12.5  Current blood sugar patterns, management and problems identified since last visit  He has again been irregular with using his pump because of not having adequate supplies which will cause periodically high readings  Also it appears that he is not doing any BOLUSES at times especially in the evening when he is not checking his blood sugar.  He did not know that he can do a bolus without checking the blood sugar  This may sometimes cause high readings the next morning especially if he is eating out  Otherwise if he is able to use the pump and bolus consistently his blood sugars are fairly good within the target range  He has had a couple of hypoglycemic episodes at least one time from a bolus at lunch  He does not think he is getting as much hypoglycemia when he  is more active in the morning at work; he also has tried using a temporary basal doing these periods of activity  He is still entering carbohydrates at the time of boluses  His overnight basal rate was reduced slightly because of tendency to early morning low sugars  Oral hypoglycemic drugs the patient is taking are: None      Side effects from medications have been: None Compliance with the medical regimen: Improved  Hypoglycemia:   occasional as above  Glucose monitoring:             Glucometer:  Freestyle    Blood Glucose readings by time of day and averages from meter download   Mean values apply above for all meters except median for One Touch  PRE-MEAL Morning  Lunch Dinner Bedtime Overall  Glucose range: 75-306  60-330  55-216  76-363    Mean/median: 180  173  139  62  167+/-71    Self-care:    Previous diabetes education: with CDE with pump start Diet: He has variable intake and variable times when eating lunch, occasionally fried food               Exercise: active mostly at work  Weight history:   Wt Readings from Last  3 Encounters:  12/03/16 197 lb 2 oz (89.4 kg)  08/25/16 198 lb 9.6 oz (90.1 kg)  08/11/16 202 lb (91.6 kg)    Glycemic control:   Lab Results  Component Value Date   HGBA1C 8.0 (H) 12/03/2016   HGBA1C 11.5 (H) 07/13/2016   HGBA1C 7.9 (H) 12/23/2015   Lab Results  Component Value Date   MICROALBUR 3.4 (H) 02/15/2015   LDLCALC 42 12/03/2016   CREATININE 1.01 12/03/2016      Allergies as of 01/13/2017   No Known Allergies     Medication List       Accurate as of 01/12/17  9:16 PM. Always use your most recent med list.          atorvastatin 40 MG tablet Commonly known as:  LIPITOR Take 1 tablet (40 mg total) by mouth daily.   carvedilol 12.5 MG tablet Commonly known as:  COREG Take 1 tablet (12.5 mg total) by mouth 2 (two) times daily.   fenofibrate 145 MG tablet Commonly known as:  TRICOR TAKE 1 TABLET (145 MG TOTAL) BY  MOUTH DAILY.   freestyle lancets Use as instructed to check blood sugar 6 times per day dx code E11.65   FREESTYLE LIBRE SENSOR SYSTEM Misc 1 each by Does not apply route continuous. Use 1 sensor every 10 days   FREESTYLE TEST STRIPS test strip Generic drug:  glucose blood USE AS INSTRUCTED TO CHECK BLOOD SUGAR 6 TIMES PER DAY DX CODE E11.65   hydrochlorothiazide 12.5 MG capsule Commonly known as:  MICROZIDE Take 1 capsule (12.5 mg total) by mouth daily.   insulin aspart 100 UNIT/ML injection Commonly known as:  NOVOLOG USE MAX 80 UNITS PER DAY WITH INSULIN PUMP   insulin lispro 100 UNIT/ML injection Commonly known as:  HUMALOG Use 80 units per day in insulin pump   metFORMIN 750 MG 24 hr tablet Commonly known as:  GLUCOPHAGE-XR Take 2 tablets (1,500 mg total) by mouth daily with breakfast.   Olmesartan-Amlodipine-HCTZ 40-10-12.5 MG Tabs TAKE 1 TABLET BY MOUTH DAILY.   sildenafil 50 MG tablet Commonly known as:  VIAGRA 1 hour prior to intercourse.       Allergies: No Known Allergies  Past Medical History:  Diagnosis Date  . Anxiety   . Depression   . Diabetes mellitus without complication (HCC)    Type 2  . DKA (diabetic ketoacidoses) (HCC)   . Heart murmur   . Hypertension   . Sleep apnea     No past surgical history on file.  Family History  Problem Relation Age of Onset  . Diabetes Father   . Hypertension Father   . Diabetes Brother   . Hypertension Mother   . Hypertension Maternal Grandmother     Social History:  reports that he has never smoked. He has never used smokeless tobacco. He reports that he drinks alcohol. He reports that he does not use drugs.     ROS  Hypertension:  followed by PCP His blood pressure has been previously poorly controlled Previously had  hypokalemia with HCTZ Also trial of clonidine patch was unsuccessful because of local itching.  Was Supposed to be seen by PCP recently but did not make his appointment Blood  pressure is still relatively high   Lipid history: Has not been on any statin drugs, has  high triglycerides  He is  taking 145 mg of fenofibrate from his PCP  Lab Results  Component Value Date   CHOL 130 12/03/2016  HDL 49.00 12/03/2016   LDLCALC 42 12/03/2016   LDLDIRECT 112.0 07/13/2016   TRIG 194.0 (H) 12/03/2016   CHOLHDL 3 12/03/2016   The last foot exam normal in 3/17  Most recent eye exam was 2017, he thinks he has been followed for glaucoma  LABS:  No visits with results within 1 Week(s) from this visit.  Latest known visit with results is:  Office Visit on 12/03/2016  Component Date Value Ref Range Status  . WBC 12/03/2016 5.4  4.0 - 10.5 K/uL Final  . RBC 12/03/2016 4.76  4.22 - 5.81 Mil/uL Final  . Platelets 12/03/2016 204.0  150.0 - 400.0 K/uL Final  . Hemoglobin 12/03/2016 14.4  13.0 - 17.0 g/dL Final  . HCT 16/10/960406/12/2016 43.2  39.0 - 52.0 % Final  . MCV 12/03/2016 90.7  78.0 - 100.0 fl Final  . MCHC 12/03/2016 33.3  30.0 - 36.0 g/dL Final  . RDW 54/09/811906/12/2016 13.2  11.5 - 15.5 % Final  . Sodium 12/03/2016 141  135 - 145 mEq/L Final  . Potassium 12/03/2016 3.4* 3.5 - 5.1 mEq/L Final  . Chloride 12/03/2016 105  96 - 112 mEq/L Final  . CO2 12/03/2016 27  19 - 32 mEq/L Final  . Glucose, Bld 12/03/2016 128* 70 - 99 mg/dL Final  . BUN 14/78/295606/12/2016 15  6 - 23 mg/dL Final  . Creatinine, Ser 12/03/2016 1.01  0.40 - 1.50 mg/dL Final  . Total Bilirubin 12/03/2016 0.6  0.2 - 1.2 mg/dL Final  . Alkaline Phosphatase 12/03/2016 37* 39 - 117 U/L Final  . AST 12/03/2016 22  0 - 37 U/L Final  . ALT 12/03/2016 25  0 - 53 U/L Final  . Total Protein 12/03/2016 7.2  6.0 - 8.3 g/dL Final  . Albumin 21/30/865706/12/2016 4.3  3.5 - 5.2 g/dL Final  . Calcium 84/69/629506/12/2016 10.0  8.4 - 10.5 mg/dL Final  . GFR 28/41/324406/12/2016 84.10  >60.00 mL/min Final  . Cholesterol 12/03/2016 130  0 - 200 mg/dL Final   ATP III Classification       Desirable:  < 200 mg/dL               Borderline High:  200 - 239 mg/dL           High:  > = 010240 mg/dL  . Triglycerides 12/03/2016 194.0* 0.0 - 149.0 mg/dL Final   Normal:  <272<150 mg/dLBorderline High:  150 - 199 mg/dL  . HDL 12/03/2016 49.00  >39.00 mg/dL Final  . VLDL 53/66/440306/12/2016 38.8  0.0 - 40.0 mg/dL Final  . LDL Cholesterol 12/03/2016 42  0 - 99 mg/dL Final  . Total CHOL/HDL Ratio 12/03/2016 3   Final                  Men          Women1/2 Average Risk     3.4          3.3Average Risk          5.0          4.42X Average Risk          9.6          7.13X Average Risk          15.0          11.0                      . NonHDL 12/03/2016 81.21   Final  NOTE:  Non-HDL goal should be 30 mg/dL higher than patient's LDL goal (i.e. LDL goal of < 70 mg/dL, would have non-HDL goal of < 100 mg/dL)  . TSH 12/03/2016 0.91  0.35 - 4.50 uIU/mL Final  . Hgb A1c MFr Bld 12/03/2016 8.0* 4.6 - 6.5 % Final   Glycemic Control Guidelines for People with Diabetes:Non Diabetic:  <6%Goal of Therapy: <7%Additional Action Suggested:  >8%     Physical Examination:  There were no vitals taken for this visit.     ASSESSMENT:  Diabetes type 2, insulin-dependent, now treated with insulin pump See history of present illness for detailed discussion of current diabetes management, blood sugar patterns and problems identified  Blood sugar control is still erratic but this is mostly because of his not bolusing for meals consistently as he did not understand the need to bolus even without checking his blood sugar Also has had difficulty getting his supplies of the Omnipod and not using it on some days Otherwise blood sugars appear to be generally fairly controlled without any obvious pattern of postprandial hyperglycemia or abnormal fasting readings Also he is trying to do a little better with avoiding hypoglycemia with activity with temporary basal Only occasionally has had low sugars in the early afternoon as discussed above  HYPERTENSION: Not adequately controlled May possibly have  hyperaldosteronism, consider further evaluation if blood pressure is not controlled   HYPERLIPIDEMIA: Needs likely a statin drug to help control LDL and further improvement in triglycerides  PLAN:   Discussed use of the Libre glucose sensor and how this would be used to monitor blood sugar need, this will also help him identify blood sugar patterns as well as help him avoid low blood sugars.  He can use this to do his boluses also when he is not able to check sugar  He needs to bolus for all meals and snacks regardless of whether he has a blood sugar reading or not  Also will have to to consider using dual or extended boluses for higher fat meals  More consistent use of temporary basal when he is more active  Follow-up with PCP for hypertension   There are no Patient Instructions on file for this visit.  Counseling time on subjects discussed above is over 50% of today's 25 minute visit  Irvine Glorioso 01/12/2017, 9:16 PM   Note: This office note was prepared with Insurance underwriter. Any transcriptional errors that result from this process are unintentional.   Patient ID: Craig Johnson, male   DOB: 01/25/1971, 46 y.o.   MRN: [id number]           Reason for Appointment: Follow-up for Type 2 Diabetes  Referring physician: None  History of Present Illness:          Date of diagnosis of type 2 diabetes mellitus: 2013       Background history:  He probably had significantly high blood sugars at time of diagnosis but details are not available. He thinks he was tried on various oral medications initially and these did not improve his blood sugar (Metformin, Januvia, Farxiga) Probably was started on insulin about 3 months after diagnosis Apparently was on as much as 60 units of Lantus daily.   He was started on an insulin pump with the OMNIPOD on 05/20/15  Recent history:   Omnipod PUMP settings:   Basal rate = 1.8 until 6 AM , 6 AM - 10 AM = 2.0 , 10  AM- 1:30 PM =  1.8 and 2 PM- 7 PM = 2.2 and 7 PM = 2.35  Total daily basal rate about 50 units  BOLUSES: carbohydrate ratio of 1:7,  Except 1:8 at lunch and correction factor 1:35 with target 120  His blood sugars had been still not adequately controlled because of difficulty getting his supplies for his pump  Last A1c was 12.5  Current blood sugar patterns, management and problems identified since last visit  He has again been irregular with using his pump because of not having adequate supplies which will cause periodically high readings  Also it appears that he is not doing any BOLUSES at times especially in the evening when he is not checking his blood sugar.  He did not know that he can do a bolus without checking the blood sugar  This may sometimes cause high readings the next morning especially if he is eating out  Otherwise if he is able to use the pump and bolus consistently his blood sugars are fairly good within the target range  He has had a couple of hypoglycemic episodes at least one time from a bolus at lunch  He does not think he is getting as much hypoglycemia when he is more active in the morning at work; he also has tried using a temporary basal doing these periods of activity  He is still entering carbohydrates at the time of boluses  His overnight basal rate was reduced slightly because of tendency to early morning low sugars  Oral hypoglycemic drugs the patient is taking are: None      Side effects from medications have been: None Compliance with the medical regimen: Improved  Hypoglycemia:   occasional as above  Glucose monitoring:             Glucometer:  Freestyle    Blood Glucose readings by time of day and averages from meter download   Mean values apply above for all meters except median for One Touch  PRE-MEAL Morning  Lunch Dinner Bedtime Overall  Glucose range: 75-306  60-330  55-216  76-363    Mean/median: 180  173  139  62  167+/-71     Self-care:    Previous diabetes education: with CDE with pump start Diet: He has variable intake and variable times when eating lunch, occasionally fried food               Exercise: active mostly at work  Weight history:   Wt Readings from Last 3 Encounters:  12/03/16 197 lb 2 oz (89.4 kg)  08/25/16 198 lb 9.6 oz (90.1 kg)  08/11/16 202 lb (91.6 kg)    Glycemic control:   Lab Results  Component Value Date   HGBA1C 8.0 (H) 12/03/2016   HGBA1C 11.5 (H) 07/13/2016   HGBA1C 7.9 (H) 12/23/2015   Lab Results  Component Value Date   MICROALBUR 3.4 (H) 02/15/2015   LDLCALC 42 12/03/2016   CREATININE 1.01 12/03/2016      Allergies as of 01/13/2017   No Known Allergies     Medication List       Accurate as of 01/12/17  9:16 PM. Always use your most recent med list.          atorvastatin 40 MG tablet Commonly known as:  LIPITOR Take 1 tablet (40 mg total) by mouth daily.   carvedilol 12.5 MG tablet Commonly known as:  COREG Take 1 tablet (12.5 mg total) by mouth 2 (two) times daily.   fenofibrate 145  MG tablet Commonly known as:  TRICOR TAKE 1 TABLET (145 MG TOTAL) BY MOUTH DAILY.   freestyle lancets Use as instructed to check blood sugar 6 times per day dx code E11.65   FREESTYLE LIBRE SENSOR SYSTEM Misc 1 each by Does not apply route continuous. Use 1 sensor every 10 days   FREESTYLE TEST STRIPS test strip Generic drug:  glucose blood USE AS INSTRUCTED TO CHECK BLOOD SUGAR 6 TIMES PER DAY DX CODE E11.65   hydrochlorothiazide 12.5 MG capsule Commonly known as:  MICROZIDE Take 1 capsule (12.5 mg total) by mouth daily.   insulin aspart 100 UNIT/ML injection Commonly known as:  NOVOLOG USE MAX 80 UNITS PER DAY WITH INSULIN PUMP   insulin lispro 100 UNIT/ML injection Commonly known as:  HUMALOG Use 80 units per day in insulin pump   metFORMIN 750 MG 24 hr tablet Commonly known as:  GLUCOPHAGE-XR Take 2 tablets (1,500 mg total) by mouth daily with  breakfast.   Olmesartan-Amlodipine-HCTZ 40-10-12.5 MG Tabs TAKE 1 TABLET BY MOUTH DAILY.   sildenafil 50 MG tablet Commonly known as:  VIAGRA 1 hour prior to intercourse.       Allergies: No Known Allergies  Past Medical History:  Diagnosis Date  . Anxiety   . Depression   . Diabetes mellitus without complication (HCC)    Type 2  . DKA (diabetic ketoacidoses) (HCC)   . Heart murmur   . Hypertension   . Sleep apnea     No past surgical history on file.  Family History  Problem Relation Age of Onset  . Diabetes Father   . Hypertension Father   . Diabetes Brother   . Hypertension Mother   . Hypertension Maternal Grandmother     Social History:  reports that he has never smoked. He has never used smokeless tobacco. He reports that he drinks alcohol. He reports that he does not use drugs.     ROS  Hypertension:  followed by PCP His blood pressure has been previously poorly controlled Previously had  hypokalemia with HCTZ Also trial of clonidine patch was unsuccessful because of local itching.  Was Supposed to be seen by PCP recently but did not make his appointment Blood pressure is still relatively high   Lipid history: Has not been on any statin drugs, has  high triglycerides  He is  taking 145 mg of fenofibrate from his PCP  Lab Results  Component Value Date   CHOL 130 12/03/2016   HDL 49.00 12/03/2016   LDLCALC 42 12/03/2016   LDLDIRECT 112.0 07/13/2016   TRIG 194.0 (H) 12/03/2016   CHOLHDL 3 12/03/2016   The last foot exam normal in 3/17  Most recent eye exam was 2017, he thinks he has been followed for glaucoma  LABS:  No visits with results within 1 Week(s) from this visit.  Latest known visit with results is:  Office Visit on 12/03/2016  Component Date Value Ref Range Status  . WBC 12/03/2016 5.4  4.0 - 10.5 K/uL Final  . RBC 12/03/2016 4.76  4.22 - 5.81 Mil/uL Final  . Platelets 12/03/2016 204.0  150.0 - 400.0 K/uL Final  . Hemoglobin  12/03/2016 14.4  13.0 - 17.0 g/dL Final  . HCT 96/09/5407 43.2  39.0 - 52.0 % Final  . MCV 12/03/2016 90.7  78.0 - 100.0 fl Final  . MCHC 12/03/2016 33.3  30.0 - 36.0 g/dL Final  . RDW 81/19/1478 13.2  11.5 - 15.5 % Final  . Sodium 12/03/2016  141  135 - 145 mEq/L Final  . Potassium 12/03/2016 3.4* 3.5 - 5.1 mEq/L Final  . Chloride 12/03/2016 105  96 - 112 mEq/L Final  . CO2 12/03/2016 27  19 - 32 mEq/L Final  . Glucose, Bld 12/03/2016 128* 70 - 99 mg/dL Final  . BUN 16/03/9603 15  6 - 23 mg/dL Final  . Creatinine, Ser 12/03/2016 1.01  0.40 - 1.50 mg/dL Final  . Total Bilirubin 12/03/2016 0.6  0.2 - 1.2 mg/dL Final  . Alkaline Phosphatase 12/03/2016 37* 39 - 117 U/L Final  . AST 12/03/2016 22  0 - 37 U/L Final  . ALT 12/03/2016 25  0 - 53 U/L Final  . Total Protein 12/03/2016 7.2  6.0 - 8.3 g/dL Final  . Albumin 54/02/8118 4.3  3.5 - 5.2 g/dL Final  . Calcium 14/78/2956 10.0  8.4 - 10.5 mg/dL Final  . GFR 21/30/8657 84.10  >60.00 mL/min Final  . Cholesterol 12/03/2016 130  0 - 200 mg/dL Final   ATP III Classification       Desirable:  < 200 mg/dL               Borderline High:  200 - 239 mg/dL          High:  > = 846 mg/dL  . Triglycerides 12/03/2016 194.0* 0.0 - 149.0 mg/dL Final   Normal:  <962 mg/dLBorderline High:  150 - 199 mg/dL  . HDL 12/03/2016 49.00  >39.00 mg/dL Final  . VLDL 95/28/4132 38.8  0.0 - 40.0 mg/dL Final  . LDL Cholesterol 12/03/2016 42  0 - 99 mg/dL Final  . Total CHOL/HDL Ratio 12/03/2016 3   Final                  Men          Women1/2 Average Risk     3.4          3.3Average Risk          5.0          4.42X Average Risk          9.6          7.13X Average Risk          15.0          11.0                      . NonHDL 12/03/2016 81.21   Final   NOTE:  Non-HDL goal should be 30 mg/dL higher than patient's LDL goal (i.e. LDL goal of < 70 mg/dL, would have non-HDL goal of < 100 mg/dL)  . TSH 12/03/2016 0.91  0.35 - 4.50 uIU/mL Final  . Hgb A1c MFr Bld 12/03/2016  8.0* 4.6 - 6.5 % Final   Glycemic Control Guidelines for People with Diabetes:Non Diabetic:  <6%Goal of Therapy: <7%Additional Action Suggested:  >8%     Physical Examination:  There were no vitals taken for this visit.     ASSESSMENT:  Diabetes type 2, insulin-dependent, now treated with insulin pump See history of present illness for detailed discussion of current diabetes management, blood sugar patterns and problems identified  Blood sugar control is still erratic but this is mostly because of his not bolusing for meals consistently as he did not understand the need to bolus even without checking his blood sugar Also has had difficulty getting his supplies of the Omnipod and not using it on some days Otherwise  blood sugars appear to be generally fairly controlled without any obvious pattern of postprandial hyperglycemia or abnormal fasting readings Also he is trying to do a little better with avoiding hypoglycemia with activity with temporary basal Only occasionally has had low sugars in the early afternoon as discussed above  HYPERTENSION: Not adequately controlled May possibly have hyperaldosteronism, consider further evaluation if blood pressure is not controlled   HYPERLIPIDEMIA: Needs likely a statin drug to help control LDL and further improvement in triglycerides  PLAN:   Discussed use of the Libre glucose sensor and how this would be used to monitor blood sugar need, this will also help him identify blood sugar patterns as well as help him avoid low blood sugars.  He can use this to do his boluses also when he is not able to check sugar  He needs to bolus for all meals and snacks regardless of whether he has a blood sugar reading or not  Also will have to to consider using dual or extended boluses for higher fat meals  More consistent use of temporary basal when he is more active  Follow-up with PCP for hypertension   There are no Patient Instructions on file for  this visit.  Counseling time on subjects discussed above is over 50% of today's 25 minute visit  Grigor Lipschutz 01/12/2017, 9:16 PM   Note: This office note was prepared with Insurance underwriter. Any transcriptional errors that result from this process are unintentional.

## 2017-01-13 ENCOUNTER — Ambulatory Visit: Payer: BLUE CROSS/BLUE SHIELD | Admitting: Endocrinology

## 2017-01-13 DIAGNOSIS — Z0289 Encounter for other administrative examinations: Secondary | ICD-10-CM

## 2017-01-28 ENCOUNTER — Other Ambulatory Visit: Payer: Self-pay | Admitting: Endocrinology

## 2017-02-19 ENCOUNTER — Encounter: Payer: Self-pay | Admitting: Endocrinology

## 2017-02-19 ENCOUNTER — Ambulatory Visit (INDEPENDENT_AMBULATORY_CARE_PROVIDER_SITE_OTHER): Payer: BLUE CROSS/BLUE SHIELD | Admitting: Endocrinology

## 2017-02-19 VITALS — BP 160/100 | HR 85 | Ht 73.0 in | Wt 195.6 lb

## 2017-02-19 DIAGNOSIS — E1165 Type 2 diabetes mellitus with hyperglycemia: Secondary | ICD-10-CM

## 2017-02-19 DIAGNOSIS — I10 Essential (primary) hypertension: Secondary | ICD-10-CM | POA: Diagnosis not present

## 2017-02-19 DIAGNOSIS — Z794 Long term (current) use of insulin: Secondary | ICD-10-CM

## 2017-02-19 MED ORDER — CARVEDILOL 25 MG PO TABS: 25.0000 mg | ORAL_TABLET | Freq: Two times a day (BID) | ORAL | 3 refills | Status: DC

## 2017-02-19 NOTE — Patient Instructions (Addendum)
Carvedilol 25 mg twice daily  No HCTZ on Sunday  Check sugar and enter for each meal plus do bolus

## 2017-02-19 NOTE — Progress Notes (Signed)
Patient ID: Craig Johnson, male   DOB: 1970/11/05, 46 y.o.   MRN: [id number]           Reason for Appointment: Follow-up for Type 2 Diabetes  Referring physician: None  History of Present Illness:          Date of diagnosis of type 2 diabetes mellitus: 2013       Background history:  He probably had significantly high blood sugars at time of diagnosis but details are not available. He thinks he was tried on various oral medications initially and these did not improve his blood sugar (Metformin, Januvia, Farxiga) Probably was started on insulin about 3 months after diagnosis Apparently was on as much as 60 units of Lantus daily.   He was started on an insulin pump with the OMNIPOD on 05/20/15  Recent history:   Omnipod PUMP settings:   Basal rate = 1.8 until 6 AM , 6 AM - 10 AM = 2.0 , 10 AM- 1:30 PM =  1.8 and 2 PM- 7 PM = 2.2 and 7 PM = 2.35  Total daily basal rate about 50 units  BOLUSES: carbohydrate ratio of 1:7,  Except 1:8 at lunch and correction factor 1:35 with target 120  Last A1c was 8% in June, previously 11.5  He is again irregular with his follow-up  Current blood sugar patterns, management and problems identified since last visit  He has been told to give boluses consistently for his meals are high sugars but he is doing this very sporadically  Although he was using the freestyle Libre sensor he has not used this recently because of irritation at the site of the sensor insertion and did not bring his reader for download  He has only 10 readings recorded in his pump for the last 30 days with AVERAGE 200 and range 76-3 02  He has only a couple of good readings in the morning but all his other readings are close to or more than 200  He was also told to BOLUS to cover his carbohydrates even if he does not have a blood sugar reading but he does not do so  Previously also his pump management has not been changed because of lack of compliance is monitoring and  boluses  He now is having some difficulty getting his pods consistently because of financial issues with his supplier  Oral hypoglycemic drugs the patient is taking are: None      Side effects from medications have been: None Compliance with the medical regimen: Improved  Hypoglycemia:   occasional as above  Glucose monitoring:             Glucometer:  Freestyle    Blood Glucose readings by download of pump as above   Self-care:    Previous diabetes education: with CDE with pump start Diet: He has variable intake and variable times when eating lunch, occasionally fried food               Exercise: active mostly at work  Weight history:   Wt Readings from Last 3 Encounters:  02/19/17 195 lb 9.6 oz (88.7 kg)  12/03/16 197 lb 2 oz (89.4 kg)  08/25/16 198 lb 9.6 oz (90.1 kg)    Glycemic control:   Lab Results  Component Value Date   HGBA1C 8.0 (H) 12/03/2016   HGBA1C 11.5 (H) 07/13/2016   HGBA1C 7.9 (H) 12/23/2015   Lab Results  Component Value Date   MICROALBUR 3.4 (H) 02/15/2015  LDLCALC 42 12/03/2016   CREATININE 1.01 12/03/2016    Other active problems addressed: See review of systems   Allergies as of 02/19/2017   No Known Allergies     Medication List       Accurate as of 02/19/17 11:59 PM. Always use your most recent med list.          atorvastatin 40 MG tablet Commonly known as:  LIPITOR Take 1 tablet (40 mg total) by mouth daily.   carvedilol 25 MG tablet Commonly known as:  COREG Take 1 tablet (25 mg total) by mouth 2 (two) times daily with a meal.   fenofibrate 145 MG tablet Commonly known as:  TRICOR TAKE 1 TABLET (145 MG TOTAL) BY MOUTH DAILY.   freestyle lancets Use as instructed to check blood sugar 6 times per day dx code E11.65   FREESTYLE LIBRE SENSOR SYSTEM Misc 1 each by Does not apply route continuous. Use 1 sensor every 10 days   FREESTYLE TEST STRIPS test strip Generic drug:  glucose blood USE AS INSTRUCTED TO CHECK BLOOD  SUGAR 6 TIMES PER DAY DX CODE E11.65   HUMALOG 100 UNIT/ML injection Generic drug:  insulin lispro USE 80 UNITS PER DAY IN INSULIN PUMP   hydrochlorothiazide 12.5 MG capsule Commonly known as:  MICROZIDE Take 1 capsule (12.5 mg total) by mouth daily.   metFORMIN 750 MG 24 hr tablet Commonly known as:  GLUCOPHAGE-XR Take 2 tablets (1,500 mg total) by mouth daily with breakfast.   Olmesartan-Amlodipine-HCTZ 40-10-12.5 MG Tabs TAKE 1 TABLET BY MOUTH DAILY.   sildenafil 50 MG tablet Commonly known as:  VIAGRA 1 hour prior to intercourse.            Discharge Care Instructions        Start     Ordered   02/19/17 0000  carvedilol (COREG) 25 MG tablet  2 times daily with meals     02/19/17 1655      Allergies: No Known Allergies  Past Medical History:  Diagnosis Date  . Anxiety   . Depression   . Diabetes mellitus without complication (HCC)    Type 2  . DKA (diabetic ketoacidoses) (HCC)   . Heart murmur   . Hypertension   . Sleep apnea     No past surgical history on file.  Family History  Problem Relation Age of Onset  . Diabetes Father   . Hypertension Father   . Diabetes Brother   . Hypertension Mother   . Hypertension Maternal Grandmother     Social History:  reports that he has never smoked. He has never used smokeless tobacco. He reports that he drinks alcohol. He reports that he does not use drugs.     ROS  Hypertension:  followed by PCP His blood pressure has been Consistently poorly controlled  He has had had  hypokalemia with HCTZ and is not on supplementation for this Has not had any evaluation for secondary causes  Also trial of clonidine patch was unsuccessful because of local itching. Currently taking triple therapy with Benicar/amlodipine/HCTZ as well as Coreg 12.5 mg and HCTZ  Blood pressure is still significantly high  BP Readings from Last 3 Encounters:  02/19/17 (!) 160/100  12/03/16 (!) 156/110  08/25/16 (!) 148/98      Lipid history: Has Recently been on Lipitor as a statin drug, has Relatively high triglycerides  He is  taking 145 mg of fenofibrate from his PCP  Lab Results  Component Value  Date   CHOL 130 12/03/2016   HDL 49.00 12/03/2016   LDLCALC 42 12/03/2016   LDLDIRECT 112.0 07/13/2016   TRIG 194.0 (H) 12/03/2016   CHOLHDL 3 12/03/2016   The last foot exam normal in 3/17  Most recent eye exam was 2017, he thinks he has been followed for glaucoma  LABS:  No visits with results within 1 Week(s) from this visit.  Latest known visit with results is:  Office Visit on 12/03/2016  Component Date Value Ref Range Status  . WBC 12/03/2016 5.4  4.0 - 10.5 K/uL Final  . RBC 12/03/2016 4.76  4.22 - 5.81 Mil/uL Final  . Platelets 12/03/2016 204.0  150.0 - 400.0 K/uL Final  . Hemoglobin 12/03/2016 14.4  13.0 - 17.0 g/dL Final  . HCT 53/66/4403 43.2  39.0 - 52.0 % Final  . MCV 12/03/2016 90.7  78.0 - 100.0 fl Final  . MCHC 12/03/2016 33.3  30.0 - 36.0 g/dL Final  . RDW 47/42/5956 13.2  11.5 - 15.5 % Final  . Sodium 12/03/2016 141  135 - 145 mEq/L Final  . Potassium 12/03/2016 3.4* 3.5 - 5.1 mEq/L Final  . Chloride 12/03/2016 105  96 - 112 mEq/L Final  . CO2 12/03/2016 27  19 - 32 mEq/L Final  . Glucose, Bld 12/03/2016 128* 70 - 99 mg/dL Final  . BUN 38/75/6433 15  6 - 23 mg/dL Final  . Creatinine, Ser 12/03/2016 1.01  0.40 - 1.50 mg/dL Final  . Total Bilirubin 12/03/2016 0.6  0.2 - 1.2 mg/dL Final  . Alkaline Phosphatase 12/03/2016 37* 39 - 117 U/L Final  . AST 12/03/2016 22  0 - 37 U/L Final  . ALT 12/03/2016 25  0 - 53 U/L Final  . Total Protein 12/03/2016 7.2  6.0 - 8.3 g/dL Final  . Albumin 29/51/8841 4.3  3.5 - 5.2 g/dL Final  . Calcium 66/11/3014 10.0  8.4 - 10.5 mg/dL Final  . GFR 07/07/3233 84.10  >60.00 mL/min Final  . Cholesterol 12/03/2016 130  0 - 200 mg/dL Final   ATP III Classification       Desirable:  < 200 mg/dL               Borderline High:  200 - 239 mg/dL           High:  > = 573 mg/dL  . Triglycerides 12/03/2016 194.0* 0.0 - 149.0 mg/dL Final   Normal:  <220 mg/dLBorderline High:  150 - 199 mg/dL  . HDL 12/03/2016 49.00  >39.00 mg/dL Final  . VLDL 25/42/7062 38.8  0.0 - 40.0 mg/dL Final  . LDL Cholesterol 12/03/2016 42  0 - 99 mg/dL Final  . Total CHOL/HDL Ratio 12/03/2016 3   Final                  Men          Women1/2 Average Risk     3.4          3.3Average Risk          5.0          4.42X Average Risk          9.6          7.13X Average Risk          15.0          11.0                      .  NonHDL 12/03/2016 81.21   Final   NOTE:  Non-HDL goal should be 30 mg/dL higher than patient's LDL goal (i.e. LDL goal of < 70 mg/dL, would have non-HDL goal of < 100 mg/dL)  . TSH 12/03/2016 0.91  0.35 - 4.50 uIU/mL Final  . Hgb A1c MFr Bld 12/03/2016 8.0* 4.6 - 6.5 % Final   Glycemic Control Guidelines for People with Diabetes:Non Diabetic:  <6%Goal of Therapy: <7%Additional Action Suggested:  >8%     Physical Examination:  BP (!) 160/100   Pulse 85   Ht 6\' 1"  (1.854 m)   Wt 195 lb 9.6 oz (88.7 kg)   SpO2 97%   BMI 25.81 kg/m     Repeat blood pressure 156/96  ASSESSMENT:  Diabetes type 2, insulin-dependent, now treated with insulin pump See history of present illness for detailed discussion of current diabetes management, blood sugar patterns and problems identified  Blood sugar control is still Extremely poor because of not getting boluses for most of his meals or high sugars He does not appear to be motivated to take better control He had been reminded to bolus with every meal regardless of whether he has a blood sugar reading Even when he was using the freestyle Libre sensor even with not bolusing even with his blood sugars being consistently high He does have a couple of good readings fasting but they are not consistent probably because of lack of adequate boluses in the evenings erratic but this is mostly because of his not bolusing for  meals consistently as he did not understand the need to bolus even without checking his blood sugar Also has had difficulty getting his supplies of the Omnipod and not using it on some days Otherwise blood sugars appear to be generally fairly controlled without any obvious pattern of postprandial hyperglycemia or abnormal fasting readings Also he is trying to do a little better with avoiding hypoglycemia with activity with temporary basal Only occasionally has had low sugars in the early afternoon as discussed above  HYPERTENSION: Not adequately controlled Likely has primary hyperaldosteronism  Will initiate further workup with getting a fasting morning level of renin/aldosterone ratio, he will not take his HCTZ on Sunday Most likely may need to also do salt loading test if this is significant   HYPERLIPIDEMIA: Needs likely a statin drug to help control LDL and further improvement in triglycerides  PLAN:   No change in basal or bolus settings as yet  He will started using the freestyle Libre glucose sensor again and will use a triamcinolone cream locally to contract the skin irritation  He needs to bolus for all meals and snacks regardless of whether he has a blood sugar reading or not, however with using the freestyle Libre sensor; he should be able to get blood sugar data entered manually also  He will also try to correlate his freestyle Libre sensor readings with his fingerstick  Also will have to to consider using dual or extended boluses for higher fat meals  Consider sending him back to the nurse educator for improving his compliance  For his hypertension he will start potassium supplements as well as get his labs checked as above Increase Coreg to 25 mg twice a day If indicated will have him start Aldactone subsequently  Patient Instructions  Carvedilol 25 mg twice daily  No HCTZ on Sunday  Check sugar and enter for each meal plus do bolus   Counseling time on subjects  discussed in assessment and plan  sections is over 50% of today's 25 minute visit  Tynleigh Birt 02/20/2017, 2:46 PM   Note: This office note was prepared with Insurance underwriter. Any transcriptional errors that result from this process are unintentional.

## 2017-02-20 MED ORDER — TRIAMCINOLONE ACETONIDE 0.1 % EX CREA: 1.0000 "application " | TOPICAL_CREAM | Freq: Two times a day (BID) | CUTANEOUS | 0 refills | Status: DC

## 2017-02-20 MED ORDER — POTASSIUM CHLORIDE CRYS ER 20 MEQ PO TBCR: 20.0000 meq | EXTENDED_RELEASE_TABLET | Freq: Every day | ORAL | 3 refills | Status: DC

## 2017-02-22 ENCOUNTER — Other Ambulatory Visit: Payer: BLUE CROSS/BLUE SHIELD

## 2017-02-23 ENCOUNTER — Other Ambulatory Visit: Payer: BLUE CROSS/BLUE SHIELD

## 2017-02-23 DIAGNOSIS — E1165 Type 2 diabetes mellitus with hyperglycemia: Secondary | ICD-10-CM

## 2017-02-23 DIAGNOSIS — Z794 Long term (current) use of insulin: Principal | ICD-10-CM

## 2017-02-23 DIAGNOSIS — I10 Essential (primary) hypertension: Secondary | ICD-10-CM

## 2017-02-25 ENCOUNTER — Telehealth: Payer: Self-pay | Admitting: Endocrinology

## 2017-02-25 ENCOUNTER — Other Ambulatory Visit: Payer: Self-pay

## 2017-02-25 LAB — ALDOSTERONE + RENIN ACTIVITY W/ RATIO
ALDOSTERONE: 19 ng/dL (ref 0.0–30.0)
Renin: <0.167 ng/mL/hr — ABNORMAL LOW (ref 0.167–5.380)

## 2017-02-25 NOTE — Telephone Encounter (Signed)
Routing to you °

## 2017-02-25 NOTE — Telephone Encounter (Signed)
MEDICATION: OMNIPODS  PHARMACY:  [facility] [phone] [place] [address] [facility] [address] [place] [id number] Phone: [phone] Fax: [phone]     IS THIS A 90 DAY SUPPLY : yes  IS PATIENT OUT OF MEDICATION:yes   IF NOT; HOW MUCH IS LEFT: n/a  LAST APPOINTMENT DATE:02/19/17  NEXT APPOINTMENT DATE:04/21/17  OTHER COMMENTS:    **Let patient know to contact pharmacy at the end of the day to make sure medication is ready. **  ** Please notify patient to allow 48-72 hours to process**  **Encourage patient to contact the pharmacy for refills or they can request refills through [facility]**

## 2017-02-26 NOTE — Telephone Encounter (Signed)
[facility] there need to know where this prescription can be filled since the right pharmacy is not listed

## 2017-02-26 NOTE — Telephone Encounter (Signed)
Patient says to [id number] [place] - [address]  [facility] is charging a $290 deductable for one box.  Patient wants to know if there's any concerns about his bloodwork?   Ty,  -LL

## 2017-03-03 NOTE — Telephone Encounter (Signed)
[facility]. Please call pharmacy and advise.

## 2017-03-04 ENCOUNTER — Telehealth: Payer: Self-pay | Admitting: Endocrinology

## 2017-03-04 NOTE — Telephone Encounter (Signed)
Patient returning call to Megan.  °

## 2017-03-04 NOTE — Telephone Encounter (Signed)
Called patient and left a voice message to let him know that [facility] not [facility] and that he will need to get these from a diabetic medical supplier like the [employer] mail service. I also advised that he can call us and I can give him the names of others if he wants to try to see if there is another mail service that may be more affordable.

## 2017-03-05 NOTE — Telephone Encounter (Signed)
Called patient and he stated that he is out of Omnipods because he is not able to pay the $290 dollars for his order with [facility]. For now his is going to use syringes to give his Humalog and check his blood sugars with his Omnipod meter to help give his dosages of insulin. He is going to check with his insurance to see if there is another mail service he can use to order his Omnipods that may be a better cost. He will call back and let us know what he finds out and who to send a prescription to.

## 2017-03-16 ENCOUNTER — Telehealth: Payer: Self-pay

## 2017-03-16 MED ORDER — INSULIN GLARGINE 300 UNIT/ML ~~LOC~~ SOPN: 24.0000 [IU] | PEN_INJECTOR | Freq: Two times a day (BID) | SUBCUTANEOUS | 1 refills | Status: DC

## 2017-03-16 NOTE — Telephone Encounter (Signed)
Called advising of MD note regarding medications. No questions at this time. Patient will call back if sugars increase

## 2017-03-16 NOTE — Telephone Encounter (Signed)
Please send prescription for Toujeo to be taken 24 units twice a day along with Humalog based on his carbohydrate intake + blood sugar and calculation using his Omnipod pump

## 2017-03-16 NOTE — Telephone Encounter (Signed)
Called advising of MD note regarding medications. No questions at this time. Patient will call back if sugars increase   

## 2017-03-16 NOTE — Telephone Encounter (Signed)
Patient called in states that he was placed on omnipod by Dr.Kumar, he states that for the past couple of weeks he has not been using it as it was too expensive, so he has been giving himself injections. He stated his sugars have been increasing and today it got up to 448, he gave himself 10 units on Humalog and it was down to 224. I spoke with Dr.Gherghe just to make sure, but she stated not to take any more Humalog right now, to monitor until Dr.Kumar returns. As he would like to see Dr.Kumar as soon as possible to discuss his options as his sugars are out of control.  I asked him what dosages he normally takes and he states he only takes Humalog when he checks his blood sugar and it is normally 10 units each time. The only blood sugar readings he had were the ones from today.   Please advise, as patient would like to see you.  Thanks!

## 2017-03-18 ENCOUNTER — Telehealth: Payer: Self-pay | Admitting: Endocrinology

## 2017-03-18 ENCOUNTER — Other Ambulatory Visit: Payer: Self-pay

## 2017-03-18 ENCOUNTER — Telehealth: Payer: Self-pay | Admitting: Internal Medicine

## 2017-03-18 MED ORDER — INSULIN GLARGINE 300 UNIT/ML ~~LOC~~ SOPN: 24.0000 [IU] | PEN_INJECTOR | Freq: Two times a day (BID) | SUBCUTANEOUS | 1 refills | Status: DC

## 2017-03-18 NOTE — Telephone Encounter (Signed)
This has been ordered 

## 2017-03-18 NOTE — Telephone Encounter (Signed)
Ordered

## 2017-03-18 NOTE — Telephone Encounter (Signed)
Pharmacy called to get clarification on Insulin Glargine (TOUJEO SOLOSTAR) 300 UNIT/ML [facility]  Pharmacy needs strength and directions. States that the only message left was to give one box. Please call.

## 2017-03-18 NOTE — Telephone Encounter (Signed)
Patient calling to inform that his rx was sent ot wrong pharmacy. Asking for Insulin Glargine (TOUJEO SOLOSTAR) 300 UNIT/ML SOPN [[id number]] To be sent to : [facility] [address] [facility], [place] - [address] [phone] (Phone) [phone] (Fax)

## 2017-03-22 ENCOUNTER — Other Ambulatory Visit: Payer: Self-pay

## 2017-03-22 MED ORDER — INSULIN PEN NEEDLE 32G X 4 MM MISC: 1 refills | Status: DC

## 2017-03-22 MED ORDER — INSULIN LISPRO 100 UNIT/ML (KWIKPEN): PEN_INJECTOR | SUBCUTANEOUS | 3 refills | Status: DC

## 2017-03-22 NOTE — Telephone Encounter (Signed)
Called patient and let him know that I have sent in an order for his pen needles. Patient asked for Humalog in the pen form since he is not able to afford his Omnipods at the moment.  Please advise dosage for Humalog pen.

## 2017-03-22 NOTE — Telephone Encounter (Signed)
I have [facility] [address] [facility] [place].   Please schedule patient sooner than what is already scheduled per Dr. Lucianne Muss.

## 2017-03-22 NOTE — Telephone Encounter (Signed)
We can send the prescription as 15 units before each meal for the Humalog.  We also need to discuss his blood pressure management, needs to be seen ASAP

## 2017-03-22 NOTE — Telephone Encounter (Signed)
Patient need a prescription for pen needles today, [facility] [address] Please advise

## 2017-03-28 ENCOUNTER — Other Ambulatory Visit: Payer: Self-pay | Admitting: Family Medicine

## 2017-03-29 NOTE — Telephone Encounter (Signed)
No showed for last appt with Craig Johnson in July, no follow up scheduled, last fill was 07/2716, !0 with 3 refills.  Please advise, thanks

## 2017-03-29 NOTE — Telephone Encounter (Signed)
Patient needs follow-up prior to receiving refills.

## 2017-04-12 ENCOUNTER — Other Ambulatory Visit: Payer: Self-pay | Admitting: Endocrinology

## 2017-04-19 ENCOUNTER — Other Ambulatory Visit: Payer: Self-pay | Admitting: Endocrinology

## 2017-04-19 ENCOUNTER — Other Ambulatory Visit (INDEPENDENT_AMBULATORY_CARE_PROVIDER_SITE_OTHER): Payer: BLUE CROSS/BLUE SHIELD

## 2017-04-19 DIAGNOSIS — Z794 Long term (current) use of insulin: Principal | ICD-10-CM

## 2017-04-19 DIAGNOSIS — E1165 Type 2 diabetes mellitus with hyperglycemia: Secondary | ICD-10-CM

## 2017-04-19 LAB — BASIC METABOLIC PANEL
BUN: 11 mg/dL (ref 6–23)
CALCIUM: 9.5 mg/dL (ref 8.4–10.5)
CHLORIDE: 102 meq/L (ref 96–112)
CO2: 29 mEq/L (ref 19–32)
CREATININE: 0.92 mg/dL (ref 0.40–1.50)
GFR: 93.52 mL/min (ref >60.00)
Glucose, Bld: 146 mg/dL — ABNORMAL HIGH (ref 70–99)
Potassium: 3.6 mEq/L (ref 3.5–5.1)
Sodium: 138 mEq/L (ref 135–145)

## 2017-04-19 LAB — HEMOGLOBIN A1C: HEMOGLOBIN A1C: 8.4 % — AB (ref 4.6–6.5)

## 2017-04-20 NOTE — Progress Notes (Deleted)
Patient ID: Craig Johnson, male   DOB: [birth year], 46 y.o.   MRN: [id number]           Reason for Appointment: Follow-up for Type 2 Diabetes  Referring physician: None  History of Present Illness:          Date of diagnosis of type 2 diabetes mellitus: 2013       Background history:  He probably had significantly high blood sugars at time of diagnosis but details are not available. He thinks he was tried on various oral medications initially and these did not improve his blood sugar (Metformin, Januvia, Farxiga) Probably was started on insulin about 3 months after diagnosis Apparently was on as much as 60 units of Lantus daily.   He was started on an insulin pump with the OMNIPOD on 05/20/15  Recent history:   Omnipod PUMP settings:   Basal rate = 1.8 until 6 AM , 6 AM - 10 AM = 2.0 , 10 AM- 1:30 PM =  1.8 and 2 PM- 7 PM = 2.2 and 7 PM = 2.35  Total daily basal rate about 50 units  BOLUSES: carbohydrate ratio of 1:7,  Except 1:8 at lunch and correction factor 1:35 with target 120  Last A1c was 8% in June, previously 11.5  He is again irregular with his follow-up  Current blood sugar patterns, management and problems identified since last visit  He has been told to give boluses consistently for his meals are high sugars but he is doing this very sporadically  Although he was using the freestyle Libre sensor he has not used this recently because of irritation at the site of the sensor insertion and did not bring his reader for download  He has only 10 readings recorded in his pump for the last 30 days with AVERAGE 200 and range 76-3 02  He has only a couple of good readings in the morning but all his other readings are close to or more than 200  He was also told to BOLUS to cover his carbohydrates even if he does not have a blood sugar reading but he does not do so  Previously also his pump management has not been changed because of lack of compliance is monitoring and  boluses  He now is having some difficulty getting his pods consistently because of financial issues with his supplier  Oral hypoglycemic drugs the patient is taking are: None      Side effects from medications have been: None Compliance with the medical regimen: Improved  Hypoglycemia:   occasional as above  Glucose monitoring:             Glucometer:  Freestyle    Blood Glucose readings by download of pump as above   Self-care:    Previous diabetes education: with CDE with pump start Diet: He has variable intake and variable times when eating lunch, occasionally fried food               Exercise: active mostly at work  Weight history:   Wt Readings from Last 3 Encounters:  02/19/17 195 lb 9.6 oz (88.7 kg)  12/03/16 197 lb 2 oz (89.4 kg)  08/25/16 198 lb 9.6 oz (90.1 kg)    Glycemic control:   Lab Results  Component Value Date   HGBA1C 8.4 (H) 04/19/2017   HGBA1C 8.0 (H) 12/03/2016   HGBA1C 11.5 (H) 07/13/2016   Lab Results  Component Value Date   MICROALBUR 3.4 (H) 02/15/2015  LDLCALC 42 12/03/2016   CREATININE 0.92 04/19/2017    Other active problems addressed: See review of systems   Allergies as of 04/21/2017   No Known Allergies     Medication List       Accurate as of 04/20/17  9:43 PM. Always use your most recent med list.          atorvastatin 40 MG tablet Commonly known as:  LIPITOR Take 1 tablet (40 mg total) by mouth daily.   carvedilol 25 MG tablet Commonly known as:  COREG Take 1 tablet (25 mg total) by mouth 2 (two) times daily with a meal.   fenofibrate 145 MG tablet Commonly known as:  TRICOR TAKE 1 TABLET (145 MG TOTAL) BY MOUTH DAILY.   freestyle lancets Use as instructed to check blood sugar 6 times per day dx code E11.65   FREESTYLE LIBRE SENSOR SYSTEM Misc USE 1 SENSOR EVERY 10 DAYS   FREESTYLE TEST STRIPS test strip Generic drug:  glucose blood USE AS INSTRUCTED TO CHECK BLOOD SUGAR 6 TIMES PER DAY DX CODE E11.65     HUMALOG 100 UNIT/ML injection Generic drug:  insulin lispro USE 80 UNITS PER DAY IN INSULIN PUMP   insulin lispro 100 UNIT/ML KiwkPen Commonly known as:  HUMALOG KWIKPEN Use to inject 15 units of insulin with each meal.   hydrochlorothiazide 12.5 MG capsule Commonly known as:  MICROZIDE Take 1 capsule (12.5 mg total) by mouth daily.   Insulin Glargine 300 UNIT/ML Sopn Commonly known as:  TOUJEO SOLOSTAR Inject 24 Units into the skin 2 (two) times daily.   Insulin Pen Needle 32G X 4 MM Misc Commonly known as:  BD PEN NEEDLE NANO U/F Use to inject insulin 2 times daily.   metFORMIN 750 MG 24 hr tablet Commonly known as:  GLUCOPHAGE-XR Take 2 tablets (1,500 mg total) by mouth daily with breakfast.   Olmesartan-Amlodipine-HCTZ 40-10-12.5 MG Tabs TAKE 1 TABLET BY MOUTH DAILY.   potassium chloride SA 20 MEQ tablet Commonly known as:  K-DUR,KLOR-CON Take 1 tablet (20 mEq total) by mouth daily.   sildenafil 50 MG tablet Commonly known as:  VIAGRA 1 hour prior to intercourse.   triamcinolone cream 0.1 % Commonly known as:  KENALOG Apply 1 application topically 2 (two) times daily.       Allergies: No Known Allergies  Past Medical History:  Diagnosis Date  . Anxiety   . Depression   . Diabetes mellitus without complication (HCC)    Type 2  . DKA (diabetic ketoacidoses) (HCC)   . Heart murmur   . Hypertension   . Sleep apnea     No past surgical history on file.  Family History  Problem Relation Age of Onset  . Diabetes Father   . Hypertension Father   . Diabetes Brother   . Hypertension Mother   . Hypertension Maternal Grandmother     Social History:  reports that he has never smoked. He has never used smokeless tobacco. He reports that he drinks alcohol. He reports that he does not use drugs.     ROS  Hypertension:  followed by PCP His blood pressure has been Consistently poorly controlled  He has had had  hypokalemia with HCTZ and is not on  supplementation for this Has not had any evaluation for secondary causes  Also trial of clonidine patch was unsuccessful because of local itching. Currently taking triple therapy with Benicar/amlodipine/HCTZ as well as Coreg 12.5 mg and HCTZ  Blood pressure is  still significantly high  BP Readings from Last 3 Encounters:  02/19/17 (!) 160/100  12/03/16 (!) 156/110  08/25/16 (!) 148/98     Lipid history: Has Recently been on Lipitor as a statin drug, has Relatively high triglycerides  He is  taking 145 mg of fenofibrate from his PCP  Lab Results  Component Value Date   CHOL 130 12/03/2016   HDL 49.00 12/03/2016   LDLCALC 42 12/03/2016   LDLDIRECT 112.0 07/13/2016   TRIG 194.0 (H) 12/03/2016   CHOLHDL 3 12/03/2016   The last foot exam normal in 3/17  Most recent eye exam was 2017, he thinks he has been followed for glaucoma  LABS:  Lab on 04/19/2017  Component Date Value Ref Range Status  . Hgb A1c MFr Bld 04/19/2017 8.4* 4.6 - 6.5 % Final   Glycemic Control Guidelines for People with Diabetes:Non Diabetic:  <6%Goal of Therapy: <7%Additional Action Suggested:  >8%   . Sodium 04/19/2017 138  135 - 145 mEq/L Final  . Potassium 04/19/2017 3.6  3.5 - 5.1 mEq/L Final  . Chloride 04/19/2017 102  96 - 112 mEq/L Final  . CO2 04/19/2017 29  19 - 32 mEq/L Final  . Glucose, Bld 04/19/2017 146* 70 - 99 mg/dL Final  . BUN 16/03/9603 11  6 - 23 mg/dL Final  . Creatinine, Ser 04/19/2017 0.92  0.40 - 1.50 mg/dL Final  . Calcium 54/02/8118 9.5  8.4 - 10.5 mg/dL Final  . GFR 14/78/2956 93.52  >60.00 mL/min Final    Physical Examination:  There were no vitals taken for this visit.    Repeat blood pressure 156/96  ASSESSMENT:  Diabetes type 2, insulin-dependent, now treated with insulin pump See history of present illness for detailed discussion of current diabetes management, blood sugar patterns and problems identified  Blood sugar control is still Extremely poor because of not  getting boluses for most of his meals or high sugars He does not appear to be motivated to take better control He had been reminded to bolus with every meal regardless of whether he has a blood sugar reading Even when he was using the freestyle Libre sensor even with not bolusing even with his blood sugars being consistently high He does have a couple of good readings fasting but they are not consistent probably because of lack of adequate boluses in the evenings erratic but this is mostly because of his not bolusing for meals consistently as he did not understand the need to bolus even without checking his blood sugar Also has had difficulty getting his supplies of the Omnipod and not using it on some days Otherwise blood sugars appear to be generally fairly controlled without any obvious pattern of postprandial hyperglycemia or abnormal fasting readings Also he is trying to do a little better with avoiding hypoglycemia with activity with temporary basal Only occasionally has had low sugars in the early afternoon as discussed above  HYPERTENSION: Not adequately controlled Likely has primary hyperaldosteronism  Will initiate further workup with getting a fasting morning level of renin/aldosterone ratio, he will not take his HCTZ on Sunday Most likely may need to also do salt loading test if this is significant   HYPERLIPIDEMIA: Needs likely a statin drug to help control LDL and further improvement in triglycerides  PLAN:   No change in basal or bolus settings as yet  He will started using the freestyle Libre glucose sensor again and will use a triamcinolone cream locally to contract the skin irritation  He needs to bolus for all meals and snacks regardless of whether he has a blood sugar reading or not, however with using the freestyle Libre sensor; he should be able to get blood sugar data entered manually also  He will also try to correlate his freestyle Libre sensor readings with his  fingerstick  Also will have to to consider using dual or extended boluses for higher fat meals  Consider sending him back to the nurse educator for improving his compliance  For his hypertension he will start potassium supplements as well as get his labs checked as above Increase Coreg to 25 mg twice a day If indicated will have him start Aldactone subsequently  There are no Patient Instructions on file for this visit.  Counseling time on subjects discussed in assessment and plan sections is over 50% of today's 25 minute visit  Eron Goble 04/20/2017, 9:43 PM   Note: This office note was prepared with Insurance underwriter. Any transcriptional errors that result from this process are unintentional.

## 2017-04-21 ENCOUNTER — Ambulatory Visit: Payer: BLUE CROSS/BLUE SHIELD | Admitting: Endocrinology

## 2017-04-22 ENCOUNTER — Other Ambulatory Visit: Payer: Self-pay | Admitting: Endocrinology

## 2017-04-23 ENCOUNTER — Other Ambulatory Visit: Payer: Self-pay | Admitting: Endocrinology

## 2017-04-23 ENCOUNTER — Other Ambulatory Visit: Payer: BLUE CROSS/BLUE SHIELD

## 2017-04-23 DIAGNOSIS — I10 Essential (primary) hypertension: Secondary | ICD-10-CM

## 2017-04-23 NOTE — Progress Notes (Unsigned)
3

## 2017-04-25 ENCOUNTER — Other Ambulatory Visit: Payer: Self-pay | Admitting: Endocrinology

## 2017-04-25 DIAGNOSIS — E269 Hyperaldosteronism, unspecified: Secondary | ICD-10-CM

## 2017-04-28 ENCOUNTER — Encounter: Payer: Self-pay | Admitting: Endocrinology

## 2017-04-28 ENCOUNTER — Ambulatory Visit (INDEPENDENT_AMBULATORY_CARE_PROVIDER_SITE_OTHER): Payer: BLUE CROSS/BLUE SHIELD | Admitting: Endocrinology

## 2017-04-28 VITALS — BP 148/94 | HR 83 | Ht 73.0 in | Wt 204.6 lb

## 2017-04-28 DIAGNOSIS — Z794 Long term (current) use of insulin: Secondary | ICD-10-CM | POA: Diagnosis not present

## 2017-04-28 DIAGNOSIS — I10 Essential (primary) hypertension: Secondary | ICD-10-CM

## 2017-04-28 DIAGNOSIS — E1165 Type 2 diabetes mellitus with hyperglycemia: Secondary | ICD-10-CM

## 2017-04-28 MED ORDER — CANAGLIFLOZIN 100 MG PO TABS: ORAL_TABLET | ORAL | 3 refills | Status: DC

## 2017-04-28 NOTE — Patient Instructions (Addendum)
Toujeo 26 twice daily  Keep going up to keep am sugar <130  Humalog  1 unit per 7g of carbs but 20 units for cereal

## 2017-04-28 NOTE — Progress Notes (Signed)
Patient ID: Craig Johnson, male   DOB: 03/15/1971, 46 y.o.   MRN: [id number]           Reason for Appointment: Follow-up for Type 2 Diabetes  Referring physician: None  History of Present Illness:          Date of diagnosis of type 2 diabetes mellitus: 2013       Background history:  He probably had significantly high blood sugars at time of diagnosis but details are not available. He thinks he was tried on various oral medications initially and these did not improve his blood sugar (Metformin, Januvia, Farxiga) Probably was started on insulin about 3 months after diagnosis Apparently was on as much as 60 units of Lantus daily.   He was started on an insulin pump with the OMNIPOD on 05/20/15  Recent history:  INSULIN regimen: Toujeo 24 units twice a day, NovoLog 15 units before meals  Omnipod PUMP settings previously:   Basal rate = 1.8 until 6 AM , 6 AM - 10 AM = 2.0 , 10 AM- 1:30 PM =  1.8 and 2 PM- 7 PM = 2.2 and 7 PM = 2.35  BOLUSES: carbohydrate ratio of 1:7,  Except 1:8 at lunch and correction factor 1:35 with target 120  His A1c is now 8.4% compared to 8% in June  Current blood sugar patterns, management and problems identified since last visit  He has gone to injections for his insulin since the Omnipod was too expensive  Although he was told to take the Novolog based on his carbohydrates and use his pump for guidance he is taking a flat dose of 15 units regardless of what he is eating  However he is trying to be fairly compliant with taking his mealtime insulin before eating most of the time  HIGHEST blood sugars appear to be within 2-3 hours of his waking up, mostly this is the early 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  He thinks his sugars maybe higher with eating cereal and he does not increase the dose for this  LOWEST blood sugars are between 8-10 PM before he goes to work  He says he is fairly active throughout his work schedule at night and only  having snacks but no meals, this his blood sugars are relatively steady around 170 average  Has only one documented low sugar at work with reading of 61 around 5 AM  Only rarely has had low blood sugars possibly from overestimating NovoLog  She is other meals in the evening as consistent postprandial pattern is seen  Oral hypoglycemic drugs the patient is taking are: None      Side effects from medications have been: None Compliance with the medical regimen: Improved  Hypoglycemia:   occasional as above  Glucose monitoring:             Glucometer:  Freestyle    Blood Glucose readings by download   Mean values apply above for all meters except median for One Touch  PRE-MEAL Midday   Dinner Overnight  Overall  Glucose range:       Mean/median: 176   160  169  190   POST-MEAL PC Breakfast PC Lunch PC Dinner  Glucose range:     Mean/median: 245   192      Self-care:    Previous diabetes education: with CDE with pump start Diet: He has variable intake and variable times, frequently eating cereal in the morning  Exercise: active mostly at work  Weight history:   Wt Readings from Last 3 Encounters:  04/28/17 204 lb 9.6 oz (92.8 kg)  02/19/17 195 lb 9.6 oz (88.7 kg)  12/03/16 197 lb 2 oz (89.4 kg)    Glycemic control:   Lab Results  Component Value Date   HGBA1C 8.4 (H) 04/19/2017   HGBA1C 8.0 (H) 12/03/2016   HGBA1C 11.5 (H) 07/13/2016   Lab Results  Component Value Date   MICROALBUR 3.4 (H) 02/15/2015   LDLCALC 42 12/03/2016   CREATININE 0.92 04/19/2017    Other active problems addressed: See review of systems   Allergies as of 04/28/2017   No Known Allergies     Medication List       Accurate as of 04/28/17  1:10 PM. Always use your most recent med list.          atorvastatin 40 MG tablet Commonly known as:  LIPITOR Take 1 tablet (40 mg total) by mouth daily.   canagliflozin 100 MG Tabs tablet Commonly known as:  INVOKANA 1 tablet  before breakfast   carvedilol 25 MG tablet Commonly known as:  COREG Take 1 tablet (25 mg total) by mouth 2 (two) times daily with a meal.   fenofibrate 145 MG tablet Commonly known as:  TRICOR TAKE 1 TABLET (145 MG TOTAL) BY MOUTH DAILY.   freestyle lancets Use as instructed to check blood sugar 6 times per day dx code E11.65   FREESTYLE LIBRE SENSOR SYSTEM Misc USE 1 SENSOR EVERY 10 DAYS   FREESTYLE TEST STRIPS test strip Generic drug:  glucose blood USE AS INSTRUCTED TO CHECK BLOOD SUGAR 6 TIMES PER DAY DX CODE E11.65   HUMALOG 100 UNIT/ML injection Generic drug:  insulin lispro USE 80 UNITS PER DAY IN INSULIN PUMP   insulin lispro 100 UNIT/ML KiwkPen Commonly known as:  HUMALOG KWIKPEN Use to inject 15 units of insulin with each meal.   hydrochlorothiazide 12.5 MG capsule Commonly known as:  MICROZIDE Take 1 capsule (12.5 mg total) by mouth daily.   Insulin Glargine 300 UNIT/ML Sopn Commonly known as:  TOUJEO SOLOSTAR Inject 24 Units into the skin 2 (two) times daily.   Insulin Pen Needle 32G X 4 MM Misc Commonly known as:  BD PEN NEEDLE NANO U/F Use to inject insulin 2 times daily.   metFORMIN 750 MG 24 hr tablet Commonly known as:  GLUCOPHAGE-XR Take 2 tablets (1,500 mg total) by mouth daily with breakfast.   Olmesartan-Amlodipine-HCTZ 40-10-12.5 MG Tabs TAKE 1 TABLET BY MOUTH DAILY.   potassium chloride SA 20 MEQ tablet Commonly known as:  K-DUR,KLOR-CON Take 1 tablet (20 mEq total) by mouth daily.   sildenafil 50 MG tablet Commonly known as:  VIAGRA 1 hour prior to intercourse.   triamcinolone cream 0.1 % Commonly known as:  KENALOG Apply 1 application topically 2 (two) times daily.       Allergies: No Known Allergies  Past Medical History:  Diagnosis Date  . Anxiety   . Depression   . Diabetes mellitus without complication (HCC)    Type 2  . DKA (diabetic ketoacidoses) (HCC)   . Heart murmur   . Hypertension   . Sleep apnea     No  past surgical history on file.  Family History  Problem Relation Age of Onset  . Diabetes Father   . Hypertension Father   . Diabetes Brother   . Hypertension Mother   . Hypertension Maternal Grandmother  Social History:  reports that he has never smoked. He has never used smokeless tobacco. He reports that he drinks alcohol. He reports that he does not use drugs.     ROS  Hypertension:  followed by PCP His blood pressure has been Consistently poorly controlled  He has had had  hypokalemia with HCTZ and is not on supplementation for this Has not had any evaluation for secondary causes  Also trial of clonidine patch was unsuccessful because of local itching. Currently taking triple therapy with Benicar/amlodipine/HCTZ as well as Coreg 25 mg  Is taking potassium supplementation  His aldosterone/renin activity level was high, over 114 with aldosterone level 19 and very low renin level  Blood pressure is still high  BP Readings from Last 3 Encounters:  04/28/17 (!) 148/94  02/19/17 (!) 160/100  12/03/16 (!) 156/110     Lipid history: Has been on 40 mg Lipitor as a statin drug, has Relatively high triglycerides  He is  taking 145 mg of fenofibrate from his PCP  Lab Results  Component Value Date   CHOL 130 12/03/2016   HDL 49.00 12/03/2016   LDLCALC 42 12/03/2016   LDLDIRECT 112.0 07/13/2016   TRIG 194.0 (H) 12/03/2016   CHOLHDL 3 12/03/2016    Most recent eye exam was 2017, he thinks he has been followed for glaucoma  LABS:  Orders Only on 04/22/2017  Component Date Value Ref Range Status  . Aldosterone U,Random 04/22/2017 WILL FOLLOW   Preliminary  . Aldosterone, 24H Ur 04/22/2017 WILL FOLLOW   Preliminary  . Creatinine, Urine 04/22/2017 52.4  Not Estab. mg/dL Final  . Creatinine, 40J Ur 04/22/2017 2096* 1,000 - 2,000 mg/24 hr Final    Physical Examination:  BP (!) 148/94   Pulse 83   Ht 6\' 1"  (1.854 m)   Wt 204 lb 9.6 oz (92.8 kg)   SpO2 97%   BMI  26.99 kg/m     Repeat blood pressure 156/96  ASSESSMENT:  Diabetes type 2, insulin-dependent, now treated with insulin pump See history of present illness for detailed discussion of current diabetes management, blood sugar patterns and problems identified  Blood sugar control is still Inadequate Since she has gone back to using multiple injections he is not getting adequate coverage with blood sugar averaging 190 overall He has has high blood sugar reading throughout the day including when he wakes up and through the night as seen on the CGM now This indicates need for higher dose of basal Also at least at his breakfast meals he is not getting enough insulin    HYPERTENSION: Not adequately controlled  But improved with increasing carvedilol Likely has primary hyperaldosteronism , soft loading test labs pending He does need to check blood pressure more regularly at home also   HYPERLIPIDEMIA:  Follow-up on next visit  PLAN:    Toujeo to be titrated by 2 units twice a day to get morning sugars below 130.  He needs to go up to 26 units twice a day for now, not clear if he can get by with once a day dosage  He will look into the cost insulin pump again and go back if he is able to afford this  He will need to take 20 units for eating cereal in the morning and preferably avoid high carbohydrate meals  Otherwise needs to use 1:7carbohydrate coverage for his meals Using his Omnipod as a  Way of calculating boluses and also including correction factor   follow-up in  6 weeks  For his hypertension  Will continue same medications but review management and further evaluation based on urine aldosterone results  Patient Instructions  Toujeo 26 twice daily  Keep going up to keep am sugar <130  Humalog  1 unit per 7g of carbs but 20 units for cereal       Counseling time on subjects discussed in assessment and plan sections is over 50% of today's 25 minute  visit  Kateryna Grantham 04/28/2017, 1:10 PM   Note: This office note was prepared with Insurance underwriterDragon voice recognition system technology. Any transcriptional errors that result from this process are unintentional.

## 2017-04-30 LAB — ALDOSTERONE, URINE
ALDOSTERONE 24H UR: 20.28 ug/(24.h) — AB (ref 0.00–19.00)
Aldosterone U,Random: 5.07 ug/L

## 2017-04-30 LAB — CREATININE, URINE, 24 HOUR
CREATININE, UR: 52.4 mg/dL
Creatinine, 24H Ur: 2096 mg/24 hr — ABNORMAL HIGH (ref 1000–2000)

## 2017-05-02 ENCOUNTER — Other Ambulatory Visit: Payer: Self-pay | Admitting: Endocrinology

## 2017-05-02 DIAGNOSIS — E269 Hyperaldosteronism, unspecified: Secondary | ICD-10-CM

## 2017-05-02 DIAGNOSIS — I152 Hypertension secondary to endocrine disorders: Secondary | ICD-10-CM

## 2017-05-06 ENCOUNTER — Telehealth: Payer: Self-pay | Admitting: Endocrinology

## 2017-05-06 ENCOUNTER — Other Ambulatory Visit: Payer: Self-pay | Admitting: Endocrinology

## 2017-05-06 DIAGNOSIS — R109 Unspecified abdominal pain: Secondary | ICD-10-CM

## 2017-05-06 NOTE — Telephone Encounter (Signed)
I have called and left 2 messages that are in result notes explaining Dr. Lucianne MussKumar has ordered a CT scan and he should hear from them with an appointment day and time

## 2017-05-06 NOTE — Telephone Encounter (Signed)
[facility] needed [facility] get [facility] ct [facility]. Please call him back

## 2017-05-12 ENCOUNTER — Ambulatory Visit
Admission: RE | Admit: 2017-05-12 | Discharge: 2017-05-12 | Disposition: A | Discharge status: Home / Self Care | Payer: BLUE CROSS/BLUE SHIELD | Source: Ambulatory Visit | Attending: Endocrinology | Admitting: Endocrinology

## 2017-05-12 DIAGNOSIS — I152 Hypertension secondary to endocrine disorders: Secondary | ICD-10-CM | POA: Diagnosis not present

## 2017-05-12 DIAGNOSIS — I700 Atherosclerosis of aorta: Secondary | ICD-10-CM | POA: Insufficient documentation

## 2017-05-12 DIAGNOSIS — K760 Fatty (change of) liver, not elsewhere classified: Secondary | ICD-10-CM | POA: Diagnosis not present

## 2017-05-12 DIAGNOSIS — E269 Hyperaldosteronism, unspecified: Secondary | ICD-10-CM | POA: Diagnosis present

## 2017-05-13 NOTE — Progress Notes (Signed)
Please call to let patient know that the scan results are normal and no further action needed

## 2017-05-18 NOTE — Telephone Encounter (Signed)
Patient is aware of this note

## 2017-05-27 ENCOUNTER — Telehealth: Payer: Self-pay

## 2017-05-27 NOTE — Telephone Encounter (Signed)
[facility]

## 2017-05-31 ENCOUNTER — Other Ambulatory Visit: Payer: Self-pay | Admitting: Family Medicine

## 2017-06-09 ENCOUNTER — Other Ambulatory Visit: Payer: Self-pay | Admitting: Endocrinology

## 2017-06-09 ENCOUNTER — Ambulatory Visit (INDEPENDENT_AMBULATORY_CARE_PROVIDER_SITE_OTHER): Payer: BLUE CROSS/BLUE SHIELD | Admitting: Endocrinology

## 2017-06-09 ENCOUNTER — Encounter: Payer: Self-pay | Admitting: Endocrinology

## 2017-06-09 VITALS — BP 136/84 | HR 76 | Ht 73.0 in | Wt 203.0 lb

## 2017-06-09 DIAGNOSIS — Z794 Long term (current) use of insulin: Secondary | ICD-10-CM | POA: Diagnosis not present

## 2017-06-09 DIAGNOSIS — I10 Essential (primary) hypertension: Secondary | ICD-10-CM | POA: Diagnosis not present

## 2017-06-09 DIAGNOSIS — E1165 Type 2 diabetes mellitus with hyperglycemia: Secondary | ICD-10-CM

## 2017-06-09 LAB — BASIC METABOLIC PANEL
BUN: 13 mg/dL (ref 6–23)
CALCIUM: 9.5 mg/dL (ref 8.4–10.5)
CO2: 26 mEq/L (ref 19–32)
Chloride: 104 mEq/L (ref 96–112)
Creatinine, Ser: 0.98 mg/dL (ref 0.40–1.50)
GFR: 86.89 mL/min (ref >60.00)
GLUCOSE: 157 mg/dL — AB (ref 70–99)
Potassium: 3.4 mEq/L — ABNORMAL LOW (ref 3.5–5.1)
SODIUM: 138 meq/L (ref 135–145)

## 2017-06-09 LAB — LIPID PANEL
Cholesterol: 153 mg/dL (ref 0–200)
HDL: 45.8 mg/dL (ref >39.00)
NonHDL: 106.8
Total CHOL/HDL Ratio: 3
Triglycerides: 255 mg/dL — ABNORMAL HIGH (ref 0.0–149.0)
VLDL: 51 mg/dL — AB (ref 0.0–40.0)

## 2017-06-09 LAB — LDL CHOLESTEROL, DIRECT: LDL DIRECT: 74 mg/dL

## 2017-06-09 LAB — MICROALBUMIN / CREATININE URINE RATIO
Creatinine,U: 31.6 mg/dL
Microalb Creat Ratio: 2.2 mg/g (ref 0.0–30.0)

## 2017-06-09 MED ORDER — SPIRONOLACTONE 50 MG PO TABS: 50.0000 mg | ORAL_TABLET | Freq: Every day | ORAL | 1 refills | Status: DC

## 2017-06-09 NOTE — Progress Notes (Signed)
Patient ID: Craig Johnson, male   DOB: [birth year], 46 y.o.   MRN: [id number]           Reason for Appointment: Follow-up for Type 2 Diabetes  Referring physician: None  History of Present Illness:          Date of diagnosis of type 2 diabetes mellitus: 2013       Background history:  He probably had significantly high blood sugars at time of diagnosis but details are not available. He thinks he was tried on various oral medications initially and these did not improve his blood sugar (Metformin, Januvia, Farxiga) Probably was started on insulin about 3 months after diagnosis Apparently was on as much as 60 units of Lantus daily.   He was started on an insulin pump with the OMNIPOD on 05/20/15  Recent history:  INSULIN regimen: Toujeo 24 units twice a day, NovoLog 5-15 units before meals Oral hypoglycemic drugs the patient is taking are: Invokana 100 mg daily, metformin ER 1500 mg daily       Omnipod PUMP settings previously:   Basal rate = 1.8 until 6 AM , 6 AM - 10 AM = 2.0 , 10 AM- 1:30 PM =  1.8 and 2 PM- 7 PM = 2.2 and 7 PM = 2.35  BOLUSES: carbohydrate ratio of 1:7,  Except 1:8 at lunch and correction factor 1:35 with target 120  His A1c is last 8.4% compared to 8% in June  Current blood sugar patterns, management and problems identified since last visit  He has still not started back on the pump which has just been approved  Currently monitoring blood sugars with a FreeStyle libre although has only intermittent recordings recently and active only 43% of the time in the last 2 weeks  Although he was told to use his pump to calculate mealtime boluses he is not consistent in what he is doing and not clear how often he is doing this  Last night he got low with taking 15 units with a low carbohydrate meal  He has benefited significantly from adding INVOKANA started on 04/28/17 because of his poor control and has no difficulty with affording this  Also no side effects  with this  Since he has only inadequate blood sugar monitoring not clear what his postprandial readings are, no obvious increase after dinner recently  However his overnight blood sugars are generally averaging about 120 with some variability   Side effects from medications have been: None  Compliance with the medical regimen: Improved  Hypoglycemia:   occasional as above  Glucose monitoring:             Glucometer:  Freestyle libre    Blood Glucose readings by download   Mean values apply above for all meters except median for One Touch  PRE-MEAL Overnight  Fasting  Dinner   Overall  Glucose range:       Mean/median: 119  127  131   126    POST-MEAL PC Breakfast PC Lunch PC Dinner  Glucose range:     Mean/median: 245  143  118      Self-care:    Previous diabetes education: with CDE with pump start Diet: He has variable intake and variable times, frequently eating cereal in the morning               Exercise: active mostly at work  Weight history:   Wt Readings from Last 3 Encounters:  06/09/17 203 lb (92.1 kg)  04/28/17 204 lb 9.6 oz (92.8 kg)  02/19/17 195 lb 9.6 oz (88.7 kg)    Glycemic control:   Lab Results  Component Value Date   HGBA1C 8.4 (H) 04/19/2017   HGBA1C 8.0 (H) 12/03/2016   HGBA1C 11.5 (H) 07/13/2016   Lab Results  Component Value Date   MICROALBUR 3.4 (H) 02/15/2015   LDLCALC 42 12/03/2016   CREATININE 0.92 04/19/2017    Other active problems addressed: See review of systems   Allergies as of 06/09/2017   No Known Allergies     Medication List        Accurate as of 06/09/17 10:44 AM. Always use your most recent med list.          atorvastatin 40 MG tablet Commonly known as:  LIPITOR Take 1 tablet (40 mg total) by mouth daily.   canagliflozin 100 MG Tabs tablet Commonly known as:  INVOKANA 1 tablet before breakfast   carvedilol 25 MG tablet Commonly known as:  COREG Take 1 tablet (25 mg total) by mouth 2 (two) times  daily with a meal.   fenofibrate 145 MG tablet Commonly known as:  TRICOR TAKE 1 TABLET (145 MG TOTAL) BY MOUTH DAILY.   freestyle lancets Use as instructed to check blood sugar 6 times per day dx code E11.65   FREESTYLE LIBRE SENSOR SYSTEM Misc USE 1 SENSOR EVERY 10 DAYS   FREESTYLE TEST STRIPS test strip Generic drug:  glucose blood USE AS INSTRUCTED TO CHECK BLOOD SUGAR 6 TIMES PER DAY DX CODE E11.65   HUMALOG 100 UNIT/ML injection Generic drug:  insulin lispro USE 80 UNITS PER DAY IN INSULIN PUMP   insulin lispro 100 UNIT/ML KiwkPen Commonly known as:  HUMALOG KWIKPEN Use to inject 15 units of insulin with each meal.   hydrochlorothiazide 12.5 MG capsule Commonly known as:  MICROZIDE Take 1 capsule (12.5 mg total) by mouth daily.   Insulin Glargine 300 UNIT/ML Sopn Commonly known as:  TOUJEO SOLOSTAR Inject 24 Units into the skin 2 (two) times daily.   Insulin Pen Needle 32G X 4 MM Misc Commonly known as:  BD PEN NEEDLE NANO U/F Use to inject insulin 2 times daily.   metFORMIN 750 MG 24 hr tablet Commonly known as:  GLUCOPHAGE-XR Take 2 tablets (1,500 mg total) by mouth daily with breakfast.   Olmesartan-Amlodipine-HCTZ 40-10-12.5 MG Tabs TAKE 1 TABLET BY MOUTH DAILY.   potassium chloride SA 20 MEQ tablet Commonly known as:  K-DUR,KLOR-CON Take 1 tablet (20 mEq total) by mouth daily.   sildenafil 50 MG tablet Commonly known as:  VIAGRA 1 hour prior to intercourse.   triamcinolone cream 0.1 % Commonly known as:  KENALOG Apply 1 application topically 2 (two) times daily.       Allergies: No Known Allergies  Past Medical History:  Diagnosis Date  . Anxiety   . Depression   . Diabetes mellitus without complication (HCC)    Type 2  . DKA (diabetic ketoacidoses) (HCC)   . Heart murmur   . Hypertension   . Sleep apnea     No past surgical history on file.  Family History  Problem Relation Age of Onset  . Diabetes Father   . Hypertension Father    . Diabetes Brother   . Hypertension Mother   . Hypertension Maternal Grandmother     Social History:  reports that  has never smoked. he has never used smokeless tobacco. He reports that he drinks alcohol. He reports that he  does not use drugs.     ROS  Hypertension:   His blood pressure has been generally poorly controlled  He has had had  hypokalemia with HCTZ and is not on supplementation for this  Also trial of clonidine patch was unsuccessful because of local itching. Currently taking triple therapy with Benicar/amlodipine/HCTZ as well as Coreg 25 mg  Is taking potassium supplementation  His aldosterone/renin activity level was high and the salt loading test showed the urine creatinine to be 20.3 However his CT scan did not show any adrenal abnormality  Blood pressure is not as high when checked initially but higher when checked by myself  BP Readings from Last 3 Encounters:  06/09/17 136/84  04/28/17 (!) 148/94  02/19/17 (!) 160/100     Lipid history: Has been on 40 mg Lipitor as a statin drug, has Relatively high triglycerides  He is  taking 145 mg of fenofibrate from his PCP  Lab Results  Component Value Date   CHOL 130 12/03/2016   HDL 49.00 12/03/2016   LDLCALC 42 12/03/2016   LDLDIRECT 112.0 07/13/2016   TRIG 194.0 (H) 12/03/2016   CHOLHDL 3 12/03/2016    Most recent eye exam was 2017, he thinks he has been followed for glaucoma  LABS:  No visits with results within 1 Week(s) from this visit.  Latest known visit with results is:  Orders Only on 04/22/2017  Component Date Value Ref Range Status  . Aldosterone U,Random 04/22/2017 5.07  Not Estab. ug/L Final  . Aldosterone, 24H Ur 04/22/2017 20.28* 0.00 - 19.00 ug/24 hr Final   Comment:                                  Adult Ranges                     Low Sodium Intake     20.00 - 80.00                     Normal Sodium Intake   0.00 - 19.00                     High Sodium Intake     0.00 -  12.00 This test was developed and its performance characteristics determined by [facility]. It has not been cleared or approved by the Food and Drug Administration.   . Creatinine, Urine 04/22/2017 52.4  Not Estab. mg/dL Final  . Creatinine, 16X24H Ur 04/22/2017 2,096* 1,000 - 2,000 mg/24 hr Final    Physical Examination:  BP 136/84   Pulse 76   Ht 6\' 1"  (1.854 m)   Wt 203 lb (92.1 kg)   SpO2 96%   BMI 26.78 kg/m     Repeat blood pressure 160/90 left arm and 156/86 left arm with large cuff  ASSESSMENT:  Diabetes type 2, insulin-dependent, now treated with insulin pump See history of present illness for detailed discussion of current diabetes management, blood sugar patterns and problems identified  Blood sugar control is appearing to be much better with adding Invokana and metformin, recent blood sugars averaging about 126 compared to 190 on the last visit This is without any change in his basic insulin dose However he is not monitoring blood sugars adequately recently especially with not scanning his blood sugar at bedtime and using the freestyle Libre intermittently Also has difficulty with calculating his mealtime insulin  dose with not using his pump currently  HYPERTENSION: Not adequately controlled , somewhat improved with adding Invokana Likely has primary hyperaldosteronism but no adrenal gland abnormality identified Still requiring multiple medications He does need to check blood pressure at home   HYPERLIPIDEMIA:  Follow-up on this visit and discuss results  PLAN:    He will go to the Omnipod insulin pump  However since Invokana appears to be reducing his blood sugars will empirically lower his basal rate by 0.2 throughout the day  He can still continue to use 1:7 carbohydrate coverage as he is still appearing to use significant amount of mealtime insulin; this is a stone his history today and his relatively low carbohydrate intake most of the time  May need different  carbohydrate coverage ratios for when he is working and being more active also   follow-up in 6 weeks  Needs follow-up urine microalbumin  For his hypertension Will consider adding Aldactone based on his labs, since he is benefiting from [place] may adjust his medications accordingly and dose of Aldactone to be determined Discussed blood pressure targets   There are no Patient Instructions on file for this visit.  Counseling time on subjects discussed in assessment and plan sections is over 50% of today's 25 minute visit  Reather Littler 06/09/2017, 10:44 AM   Note: This office note was prepared with Dragon voice recognition system technology. Any transcriptional errors that result from this process are unintentional.

## 2017-06-09 NOTE — Patient Instructions (Addendum)
TRY 1:7 G CARBS FOR MEALS   Basal rate = 1.6 until 6 AM , 6 AM - 10 AM = 1.8 , 10 AM- 1:30 PM =  1.6 and 2 PM- 7 PM = 2 and 7 PM = 2.1

## 2017-06-10 LAB — FRUCTOSAMINE: Fructosamine: 295 umol/L — ABNORMAL HIGH (ref 0–285)

## 2017-06-14 ENCOUNTER — Other Ambulatory Visit: Payer: Self-pay | Admitting: Endocrinology

## 2017-06-15 ENCOUNTER — Other Ambulatory Visit: Payer: Self-pay

## 2017-06-15 ENCOUNTER — Encounter: Payer: Self-pay | Admitting: Endocrinology

## 2017-07-01 ENCOUNTER — Other Ambulatory Visit: Payer: Self-pay | Admitting: Endocrinology

## 2017-07-01 NOTE — Telephone Encounter (Signed)
Please advise if ok to refill the Klor-con. Medication is not on current list.

## 2017-07-04 ENCOUNTER — Other Ambulatory Visit: Payer: Self-pay | Admitting: Endocrinology

## 2017-07-26 ENCOUNTER — Other Ambulatory Visit: Payer: Self-pay

## 2017-07-26 ENCOUNTER — Encounter: Payer: Self-pay | Admitting: Endocrinology

## 2017-07-26 ENCOUNTER — Ambulatory Visit (INDEPENDENT_AMBULATORY_CARE_PROVIDER_SITE_OTHER): Payer: Managed Care, Other (non HMO) | Admitting: Endocrinology

## 2017-07-26 VITALS — BP 136/88 | HR 69 | Ht 73.0 in | Wt 193.8 lb

## 2017-07-26 DIAGNOSIS — I10 Essential (primary) hypertension: Secondary | ICD-10-CM | POA: Diagnosis not present

## 2017-07-26 DIAGNOSIS — E1165 Type 2 diabetes mellitus with hyperglycemia: Secondary | ICD-10-CM | POA: Diagnosis not present

## 2017-07-26 DIAGNOSIS — Z794 Long term (current) use of insulin: Secondary | ICD-10-CM

## 2017-07-26 DIAGNOSIS — E119 Type 2 diabetes mellitus without complications: Secondary | ICD-10-CM

## 2017-07-26 LAB — POCT GLYCOSYLATED HEMOGLOBIN (HGB A1C): Hemoglobin A1C: 7.6

## 2017-07-26 LAB — BASIC METABOLIC PANEL
BUN: 18 mg/dL (ref 6–23)
CHLORIDE: 100 meq/L (ref 96–112)
CO2: 29 meq/L (ref 19–32)
Calcium: 10 mg/dL (ref 8.4–10.5)
Creatinine, Ser: 1.14 mg/dL (ref 0.40–1.50)
GFR: 72.94 mL/min (ref >60.00)
GLUCOSE: 140 mg/dL — AB (ref 70–99)
POTASSIUM: 3.8 meq/L (ref 3.5–5.1)
SODIUM: 138 meq/L (ref 135–145)

## 2017-07-26 MED ORDER — INSULIN ASPART 100 UNIT/ML ~~LOC~~ SOLN: SUBCUTANEOUS | 6 refills | Status: DC

## 2017-07-26 NOTE — Progress Notes (Signed)
Patient ID: Craig Johnson, male   DOB: 12/02/1970, 47 y.o.   MRN: [id number]           Reason for Appointment: Follow-up for Type 2 Diabetes  Referring physician: None  History of Present Illness:          Date of diagnosis of type 2 diabetes mellitus: 2013       Background history:  He probably had significantly high blood sugars at time of diagnosis but details are not available. He thinks he was tried on various oral medications initially and these did not improve his blood sugar (Metformin, Januvia, Farxiga) Probably was started on insulin about 3 months after diagnosis Apparently was on as much as 60 units of Lantus daily.   He was started on an insulin pump with the OMNIPOD on 05/20/15  Recent history:   Oral hypoglycemic drugs the patient is taking are: Invokana 100 mg daily, metformin ER 1500 mg daily       Omnipod PUMP settings :   Basal rate = 1.8 until 6 AM , 6 AM - 10 AM = 2.0 , 10 AM- 1:30 PM =  1.8 and 2 PM- 7 PM = 2.2 and 7 PM = 2.35  BOLUSES: carbohydrate ratio of 1:7,  Except 1:8 at lunch and correction factor 1:35 with target 120  His A1c is now 7.6, better than previous readings  Current blood sugar patterns, management and problems identified since last visit  He has started back on the pump about a week ago  Currently monitoring blood sugars with a FreeStyle libre although has only intermittent recordings on some days  He is working night shifts mostly now  Appears to have HYPOGLYCEMIA tendency when he is sleeping either on his off days or on night shift  LOWEST blood sugars appear to be between about 2 PM-6 PM and fairly consistent with lowest average 80 only  POSTPRANDIAL readings do not appear to be consistently high and mostly appear to be high after his breakfast which is his main meal usually when working  Also yesterday referred to bolus when he had breakfast and blood sugar was 297; he is sometimes eating more carbohydrate without as much  protein such as waffles in the morning  He also may not be bolusing consistently in the evenings, has occasional high readings after evening meal but mostly under 180   Side effects from medications have been: None  Compliance with the medical regimen: Improved  Hypoglycemia:   occasional as above  Glucose monitoring:             Glucometer:  Freestyle libre    Blood Glucose readings by download   Mean values apply above for all meters except median for One Touch  PRE-MEAL Fasting Lunch Dinner Bedtime Overall  Glucose range:       Mean/median: 103   91   113    POST-MEAL PC Breakfast PC Lunch PC Dinner  Glucose range:     Mean/median: 149  80  100       Self-care:    Previous diabetes education: with CDE with pump start Diet: He has variable intake and variable times               Exercise: active on most days, even at home  Weight history:   Wt Readings from Last 3 Encounters:  07/26/17 193 lb 12.8 oz (87.9 kg)  06/09/17 203 lb (92.1 kg)  04/28/17 204 lb 9.6 oz (92.8 kg)  Glycemic control:   Lab Results  Component Value Date   HGBA1C 7.6 07/26/2017   HGBA1C 8.4 (H) 04/19/2017   HGBA1C 8.0 (H) 12/03/2016   Lab Results  Component Value Date   MICROALBUR <0.7 06/09/2017   LDLCALC 42 12/03/2016   CREATININE 0.98 06/09/2017    Other active problems addressed: See review of systems   Allergies as of 07/26/2017   No Known Allergies     Medication List        Accurate as of 07/26/17 11:17 AM. Always use your most recent med list.          atorvastatin 40 MG tablet Commonly known as:  LIPITOR Take 1 tablet (40 mg total) by mouth daily.   canagliflozin 100 MG Tabs tablet Commonly known as:  INVOKANA 1 tablet before breakfast   carvedilol 25 MG tablet Commonly known as:  COREG TAKE 1 TABLET (25 MG TOTAL) BY MOUTH 2 (TWO) TIMES DAILY WITH A MEAL.   fenofibrate 145 MG tablet Commonly known as:  TRICOR TAKE 1 TABLET (145 MG TOTAL) BY MOUTH  DAILY.   freestyle lancets Use as instructed to check blood sugar 6 times per day dx code E11.65   FREESTYLE LIBRE SENSOR SYSTEM Misc USE 1 SENSOR EVERY 10 DAYS   FREESTYLE TEST STRIPS test strip Generic drug:  glucose blood USE AS INSTRUCTED TO CHECK BLOOD SUGAR 6 TIMES PER DAY DX CODE E11.65   HUMALOG 100 UNIT/ML injection Generic drug:  insulin lispro USE 80 UNITS PER DAY IN INSULIN PUMP   hydrochlorothiazide 12.5 MG capsule Commonly known as:  MICROZIDE Take 1 capsule (12.5 mg total) by mouth daily.   Insulin Glargine 300 UNIT/ML Sopn Commonly known as:  TOUJEO SOLOSTAR Inject 24 Units into the skin 2 (two) times daily.   Insulin Pen Needle 32G X 4 MM Misc Commonly known as:  BD PEN NEEDLE NANO U/F Use to inject insulin 2 times daily.   metFORMIN 750 MG 24 hr tablet Commonly known as:  GLUCOPHAGE-XR Take 2 tablets (1,500 mg total) by mouth daily with breakfast.   Olmesartan-Amlodipine-HCTZ 40-10-12.5 MG Tabs TAKE 1 TABLET BY MOUTH DAILY.   sildenafil 50 MG tablet Commonly known as:  VIAGRA 1 hour prior to intercourse.   spironolactone 50 MG tablet Commonly known as:  ALDACTONE Take 1 tablet (50 mg total) by mouth daily.   triamcinolone cream 0.1 % Commonly known as:  KENALOG Apply 1 application topically 2 (two) times daily.       Allergies: No Known Allergies  Past Medical History:  Diagnosis Date  . Anxiety   . Depression   . Diabetes mellitus without complication (HCC)    Type 2  . DKA (diabetic ketoacidoses) (HCC)   . Heart murmur   . Hypertension   . Sleep apnea     No past surgical history on file.  Family History  Problem Relation Age of Onset  . Diabetes Father   . Hypertension Father   . Diabetes Brother   . Hypertension Mother   . Hypertension Maternal Grandmother     Social History:  reports that  has never smoked. he has never used smokeless tobacco. He reports that he drinks alcohol. He reports that he does not use drugs.      ROS  Hypertension:   His blood pressure has been generally poorly controlled  He has had had  hypokalemia with HCTZ  Also trial of clonidine patch was unsuccessful because of local itching.  Currently taking  triple therapy with Benicar/amlodipine/HCTZ as well as Coreg 25 mg; Aldactone 50 mg was started in 12/18   His aldosterone/renin activity level was high and the salt loading test showed the urine creatinine to be 20.3 However his CT scan did not show any adrenal abnormality  Blood pressure is still relatively high, repeat blood pressure today was 140/90   BP Readings from Last 3 Encounters:  07/26/17 136/88  06/09/17 136/84  04/28/17 (!) 148/94   Lab Results  Component Value Date   CREATININE 0.98 06/09/2017   BUN 13 06/09/2017   NA 138 06/09/2017   K 3.4 (L) 06/09/2017   CL 104 06/09/2017   CO2 26 06/09/2017     Lipid history: Has been on 40 mg Lipitor as a statin drug He is  taking 145 mg of fenofibrate from his PCP with fair control  Lab Results  Component Value Date   CHOL 153 06/09/2017   HDL 45.80 06/09/2017   LDLCALC 42 12/03/2016   LDLDIRECT 74.0 06/09/2017   TRIG 255.0 (H) 06/09/2017   CHOLHDL 3 06/09/2017    Most recent eye exam was 2018, he thinks he has been followed for glaucoma   Physical Examination:  BP 136/88 (BP Location: Left Arm, Patient Position: Sitting, Cuff Size: Large)   Pulse 69   Ht 6\' 1"  (1.854 m)   Wt 193 lb 12.8 oz (87.9 kg)   BMI 25.57 kg/m     ASSESSMENT:  Diabetes type 2, insulin-dependent, now treated with insulin pump See history of present illness for detailed discussion of current diabetes management, blood sugar patterns and problems identified  Blood sugar control is appearing to be improving with the insulin pump recently although he also has benefited from continuing Invokana and metformin A1c is below 8% which is better than usual He still has some difficulties remembering to do boluses consistently  before eating and may possibly be skipping some boluses in the evenings Some meals in the morning and may be either relatively high fat or high carbohydrate causing hyperglycemia otherwise no consistent periods of hyperglycemia seen His basal rate appears to be excessive in the afternoons because of periodic hypoglycemia and average blood sugar only 80 between 2-4 PM on his sensor  HYPERTENSION: Not adequately controlled This is despite adding Aldactone Also on Invokana Needs follow-up for his hypokalemia   HYPERLIPIDEMIA: Fair control with high triglycerides in December possibly from higher sugar  PLAN:    He will use the freestyle Sensor consistently every day and checked at various times including at night  He will need different basal rate for his night shifts with reduced insulin in the afternoon, discussed using basal 1 for the daytime waking and basal to 4 night shifts  Also will reduce the afternoon basal rate on the day shifts since he is active in the afternoon  The basal rate at midnight will be reduced by 0.1 when not working  He can still continue to use 1:7 carbohydrate coverage  However needs to bolus consistently before eating for all meals and snacks containing carbohydrate  HYPERTENSION: Not clear why this is not improved with adding Aldactone but consider increasing the dose First needs to have a potassium checked today  There are no Patient Instructions on file for this visit.  Counseling time on subjects discussed in assessment and plan sections is over 50% of today's 25 minute visit  Reather LittlerAjay Nastasia Kage 07/26/2017, 11:17 AM   Note: This office note was prepared with Dragon voice recognition system  technology. Any transcriptional errors that result from this process are unintentional.

## 2017-07-27 ENCOUNTER — Other Ambulatory Visit: Payer: Self-pay

## 2017-07-27 MED ORDER — INSULIN LISPRO 100 UNIT/ML ~~LOC~~ SOLN: SUBCUTANEOUS | 3 refills | Status: DC

## 2017-08-10 ENCOUNTER — Other Ambulatory Visit: Payer: Self-pay | Admitting: Endocrinology

## 2017-08-11 ENCOUNTER — Other Ambulatory Visit: Payer: Self-pay | Admitting: Endocrinology

## 2017-08-11 ENCOUNTER — Encounter: Payer: Self-pay | Admitting: Endocrinology

## 2017-08-12 ENCOUNTER — Other Ambulatory Visit: Payer: Self-pay | Admitting: Family Medicine

## 2017-08-16 MED ORDER — METFORMIN HCL ER 750 MG PO TB24: 1500.0000 mg | ORAL_TABLET | Freq: Every day | ORAL | 1 refills | Status: DC

## 2017-08-16 MED ORDER — FREESTYLE LIBRE SENSOR SYSTEM MISC: 3 refills | Status: DC

## 2017-08-18 DIAGNOSIS — Z0279 Encounter for issue of other medical certificate: Secondary | ICD-10-CM

## 2017-08-19 ENCOUNTER — Encounter: Payer: Self-pay | Admitting: Endocrinology

## 2017-09-02 ENCOUNTER — Other Ambulatory Visit: Payer: Self-pay | Admitting: Family Medicine

## 2017-09-03 NOTE — Telephone Encounter (Signed)
Patient needs to establish with new provider either Dr. Judie GrieveMclean-Scocuzza or Rennie PlowmanMargaret Arnett NP for refill approval.

## 2017-09-10 ENCOUNTER — Other Ambulatory Visit: Payer: Self-pay | Admitting: Endocrinology

## 2017-09-14 ENCOUNTER — Other Ambulatory Visit: Payer: Self-pay | Admitting: Endocrinology

## 2017-09-21 ENCOUNTER — Other Ambulatory Visit: Payer: Managed Care, Other (non HMO)

## 2017-09-23 ENCOUNTER — Ambulatory Visit: Payer: Managed Care, Other (non HMO) | Admitting: Endocrinology

## 2017-10-06 ENCOUNTER — Other Ambulatory Visit: Payer: Self-pay | Admitting: Family Medicine

## 2017-10-10 ENCOUNTER — Other Ambulatory Visit: Payer: Self-pay | Admitting: Family Medicine

## 2017-10-10 ENCOUNTER — Other Ambulatory Visit: Payer: Self-pay | Admitting: Endocrinology

## 2017-10-11 ENCOUNTER — Telehealth: Payer: Self-pay | Admitting: Endocrinology

## 2017-10-14 ENCOUNTER — Other Ambulatory Visit: Payer: Self-pay | Admitting: *Deleted

## 2017-10-14 MED ORDER — FREESTYLE LIBRE 14 DAY SENSOR MISC: 1.0000 | 3 refills | Status: DC

## 2017-10-14 MED ORDER — FREESTYLE LIBRE 14 DAY READER DEVI: 1.0000 | 0 refills | Status: DC

## 2017-11-05 ENCOUNTER — Other Ambulatory Visit: Payer: Self-pay | Admitting: Endocrinology

## 2017-11-08 ENCOUNTER — Other Ambulatory Visit: Payer: Self-pay | Admitting: Endocrinology

## 2017-11-08 ENCOUNTER — Encounter: Payer: Self-pay | Admitting: Endocrinology

## 2017-11-08 ENCOUNTER — Encounter (INDEPENDENT_AMBULATORY_CARE_PROVIDER_SITE_OTHER): Payer: Self-pay

## 2017-11-08 ENCOUNTER — Other Ambulatory Visit: Payer: Self-pay | Admitting: Family Medicine

## 2017-11-08 ENCOUNTER — Ambulatory Visit (INDEPENDENT_AMBULATORY_CARE_PROVIDER_SITE_OTHER): Payer: Managed Care, Other (non HMO) | Admitting: Endocrinology

## 2017-11-08 VITALS — BP 130/84 | HR 77 | Ht 73.0 in | Wt 189.2 lb

## 2017-11-08 DIAGNOSIS — E1165 Type 2 diabetes mellitus with hyperglycemia: Secondary | ICD-10-CM | POA: Diagnosis not present

## 2017-11-08 DIAGNOSIS — I10 Essential (primary) hypertension: Secondary | ICD-10-CM

## 2017-11-08 DIAGNOSIS — E119 Type 2 diabetes mellitus without complications: Secondary | ICD-10-CM | POA: Diagnosis not present

## 2017-11-08 DIAGNOSIS — Z794 Long term (current) use of insulin: Secondary | ICD-10-CM | POA: Diagnosis not present

## 2017-11-08 LAB — COMPREHENSIVE METABOLIC PANEL
ALBUMIN: 4.4 g/dL (ref 3.5–5.2)
ALK PHOS: 50 U/L (ref 39–117)
ALT: 30 U/L (ref 0–53)
AST: 19 U/L (ref 0–37)
BILIRUBIN TOTAL: 0.4 mg/dL (ref 0.2–1.2)
BUN: 17 mg/dL (ref 6–23)
CALCIUM: 9.7 mg/dL (ref 8.4–10.5)
CO2: 29 meq/L (ref 19–32)
CREATININE: 1.06 mg/dL (ref 0.40–1.50)
Chloride: 100 mEq/L (ref 96–112)
GFR: 79.23 mL/min (ref >60.00)
Glucose, Bld: 127 mg/dL — ABNORMAL HIGH (ref 70–99)
Potassium: 3.6 mEq/L (ref 3.5–5.1)
Sodium: 136 mEq/L (ref 135–145)
TOTAL PROTEIN: 7.7 g/dL (ref 6.0–8.3)

## 2017-11-08 LAB — POCT GLYCOSYLATED HEMOGLOBIN (HGB A1C): Hemoglobin A1C: 6.7

## 2017-11-08 MED ORDER — FREESTYLE LIBRE 14 DAY READER DEVI: 1.0000 | 0 refills | Status: DC

## 2017-11-08 NOTE — Progress Notes (Addendum)
Patient ID: Craig Johnson, male   DOB: [birth year], 47 y.o.   MRN: [id number]           Reason for Appointment: Follow-up for Type 2 Diabetes  Referring physician: None  History of Present Illness:          Date of diagnosis of type 2 diabetes mellitus: 2013       Background history:  He probably had significantly high blood sugars at time of diagnosis but details are not available. He thinks he was tried on various oral medications initially and these did not improve his blood sugar (Metformin, Januvia, Farxiga) Probably was started on insulin about 3 months after diagnosis Apparently was on as much as 60 units of Lantus daily.   He was started on an insulin pump with the OMNIPOD on 05/20/15  Recent history:   Oral hypoglycemic drugs the patient is taking are: Invokana 100 mg daily, metformin ER 1500 mg daily       Omnipod PUMP settings :   Basal rate = 1.7 until 6 AM , 6 AM - 11 AM = 2.1 , 11 AM- 2 PM = 1 1.9, 2 PM-7 PM =  1.8 and 7 PM = 2.2  BOLUSES: carbohydrate ratio of 1:7,  Except 1:8 at lunch and correction factor 1:35 with target 120  His A1c is now 6.7 compared to 7.6   Current blood sugar patterns, management and problems identified since last visit  He has been again somewhat irregular recently with his OmniPod insulin pump because of various issues and not getting his supplies as he showed  Also appears to be very erratic with monitoring his blood sugar with his freestyle libre or entering them in the pump  He tends to have HYPOGLYCEMIA with his increased activity at work but 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  Usually eating breakfast 1 to 2 hours before  Although he is aware about suspending the pump and using temporary basal he is not using dysfunction  Currently not working night shifts  No postprandial readings are available for review although may have had occasional relatively high readings late at night on his  sensor; has a couple of readings in the 180, 200 range once lunchtime on the weekend  Again does not appear to be consistently bolusing for his meals when he is using his pump and has a few days where there are no boluses available  FASTING blood sugars recently ranged from 78-137  He is currently using the basal #2 for his current set up since he is only working day shifts    Side effects from medications have been: None  Compliance with the medical regimen: Improved  Hypoglycemia:   occasional as above  Glucose monitoring:             Glucometer:  Freestyle libre    Blood Glucose readings as above  Blood sugar average on his smart phone for his [employer] 123 recently and on the CGM for the last 2 weeks 116 with only readings 24% of the time   Self-care:    Previous diabetes education: with CDE with pump start Diet: He has variable intake and variable times               Exercise: active on most days, even at home  Weight history:   Wt Readings from Last 3 Encounters:  11/08/17 189 lb 3.2 oz (85.8 kg)  07/26/17 193  lb 12.8 oz (87.9 kg)  06/09/17 203 lb (92.1 kg)    Glycemic control:   Lab Results  Component Value Date   HGBA1C 6.7 11/08/2017   HGBA1C 7.6 07/26/2017   HGBA1C 8.4 (H) 04/19/2017   Lab Results  Component Value Date   MICROALBUR <0.7 06/09/2017   LDLCALC 42 12/03/2016   CREATININE 1.14 07/26/2017    Other active problems addressed: See review of systems   Allergies as of 11/08/2017   No Known Allergies     Medication List        Accurate as of 11/08/17  4:09 PM. Always use your most recent med list.          atorvastatin 40 MG tablet Commonly known as:  LIPITOR Take 1 tablet (40 mg total) by mouth daily.   carvedilol 25 MG tablet Commonly known as:  COREG TAKE 1 TABLET (25 MG TOTAL) BY MOUTH 2 (TWO) TIMES DAILY WITH A MEAL.   fenofibrate 145 MG tablet Commonly known as:  TRICOR TAKE 1 TABLET (145 MG TOTAL) BY MOUTH DAILY.     freestyle lancets Use as instructed to check blood sugar 6 times per day dx code E11.65   FREESTYLE LIBRE 14 DAY READER Devi 1 each by Does not apply route as directed.   FREESTYLE LIBRE 14 DAY SENSOR Misc 1 each by Does not apply route every 14 (fourteen) days.   FREESTYLE TEST STRIPS test strip Generic drug:  glucose blood USE AS INSTRUCTED TO CHECK BLOOD SUGAR 6 TIMES PER DAY DX CODE E11.65   insulin aspart 100 UNIT/ML injection Commonly known as:  NOVOLOG Use 80 units daily in insulin pump   insulin lispro 100 UNIT/ML injection Commonly known as:  HUMALOG USE 80 UNITS PER DAY IN INSULIN PUMP   Insulin Pen Needle 32G X 4 MM Misc Commonly known as:  BD PEN NEEDLE NANO U/F Use to inject insulin 2 times daily.   INVOKANA 100 MG Tabs tablet Generic drug:  canagliflozin TAKE 1 TABLET DAILY BEFORE BREAKFAST   metFORMIN 750 MG 24 hr tablet Commonly known as:  GLUCOPHAGE-XR Take 2 tablets (1,500 mg total) by mouth daily with breakfast.   Olmesartan-amLODIPine-HCTZ 40-10-12.5 MG Tabs TAKE 1 TABLET BY MOUTH DAILY.   spironolactone 50 MG tablet Commonly known as:  ALDACTONE TAKE 1 TABLET BY MOUTH DAILY   triamcinolone cream 0.1 % Commonly known as:  KENALOG Apply 1 application topically 2 (two) times daily.       Allergies: No Known Allergies  Past Medical History:  Diagnosis Date  . Anxiety   . Depression   . Diabetes mellitus without complication (HCC)    Type 2  . DKA (diabetic ketoacidoses) (HCC)   . Heart murmur   . Hypertension   . Sleep apnea     History reviewed. No pertinent surgical history.  Family History  Problem Relation Age of Onset  . Diabetes Father   . Hypertension Father   . Diabetes Brother   . Hypertension Mother   . Hypertension Maternal Grandmother     Social History:  reports that he has never smoked. He has never used smokeless tobacco. He reports that he drinks alcohol. He reports that he does not use drugs.      ROS  Hypertension:   His blood pressure has been recently better controlled  Currently taking triple therapy with Benicar/amlodipine/HCTZ as well as Coreg 25 mg; Aldactone 50 mg was started in 12/18  Home readings recently: 125-135/80-85  He  has had had  hypokalemia with HCTZ  Also trial of clonidine patch was unsuccessful because of local itching.  His aldosterone/renin activity level was high and the salt loading test showed the urine creatinine to be 20.3 However his CT scan did not show any adrenal abnormality   BP Readings from Last 3 Encounters:  11/08/17 130/84  07/26/17 136/88  06/09/17 136/84   Lab Results  Component Value Date   CREATININE 1.14 07/26/2017   BUN 18 07/26/2017   NA 138 07/26/2017   K 3.8 07/26/2017   CL 100 07/26/2017   CO2 29 07/26/2017     Lipid history: Has been on 40 mg Lipitor as a statin drug He is  taking 145 mg of fenofibrate from his PCP with fair control  Lab Results  Component Value Date   CHOL 153 06/09/2017   HDL 45.80 06/09/2017   LDLCALC 42 12/03/2016   LDLDIRECT 74.0 06/09/2017   TRIG 255.0 (H) 06/09/2017   CHOLHDL 3 06/09/2017    Most recent eye exam was 2018, he thinks he has been followed for glaucoma   Physical Examination:  BP 130/84 (BP Location: Left Arm, Patient Position: Sitting, Cuff Size: Normal)   Pulse 77   Ht  (1.854 m)   Wt 189 lb 3.2 oz (85.8 kg)   SpO2 97%   BMI 24.96 kg/m     ASSESSMENT:  Diabetes type 2, insulin-dependent, now treated with insulin pump See history of present illness for detailed discussion of current diabetes management, blood sugar patterns and problems identified  Blood sugar control is appearing to be improving with the insulin pump  A1c 6.7 and this is about the best he has had   He has incomplete blood sugar monitoring information both on his freestyle libre reader and his smart phone and difficult to know what his patterns are  However as discussed above  his management is still inconsistent with not using his Omnipod insulin pump consistently or even bolusing consistently This indicates that likely is not completely insulin deficient and also benefiting significantly from Invokana Since he is active at work his blood sugars are easier to control on the weekdays  He is not taking steps to prevent hypoglycemia 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  HYPERTENSION:   Currently on 3 drug regimen and better controlled, also on Aldactone despite not having confirmation of hyperaldosteronism Also benefiting from Invokana   HYPERLIPIDEMIA: Fair control with high triglycerides in December possibly from higher sugar  PLAN:    He will use the freestyle Sensor 3-4 times a day including after meals and as needed especially at work when active  He needs to use the insulin pump consistently  Also advised him to bolus for all meals and also snacks regardless of whether he checks his blood sugar or not  For now he can use a temporary basal or even suspend the pump in the mornings when he is very active around 7 AM when he starts his work  He will need to adjust his boluses more frequently based on what he is eating especially when eating out  Also is using the new controller for his pump he will be able to get better information on his carbohydrate content to help and bolus more accurately  Call if having difficulties with the pump settings or excessive hypoglycemia  No change in carbohydrate ratio but needs more postprandial monitoring  He will continue to use the 14-day sensor  Discussed day-to-day management of diabetes with monitoring and insulin pump management  HYPERTENSION:   Overall better controlled with current regimen and continuing Invokana also  There are no Patient Instructions on file for this visit.  Counseling time on subjects discussed in assessment and plan sections is over 50% of today's 25 minute  visit  Reather Littler 11/08/2017, 4:09 PM   Note: This office note was prepared with Dragon voice recognition system technology. Any transcriptional errors that result from this process are unintentional.   ADDENDUM: Chemistry panel normal

## 2017-11-09 MED ORDER — FENOFIBRATE 145 MG PO TABS: 145.0000 mg | ORAL_TABLET | Freq: Every day | ORAL | 0 refills | Status: DC

## 2017-11-09 NOTE — Telephone Encounter (Signed)
Last OV 08/25/16 with DR.Cook, last filled 06/03/17 by Dr.Sonnenberg 06/03/17 90 0rf

## 2017-11-15 ENCOUNTER — Encounter: Payer: Managed Care, Other (non HMO) | Admitting: Internal Medicine

## 2017-11-21 ENCOUNTER — Other Ambulatory Visit: Payer: Self-pay | Admitting: Endocrinology

## 2017-11-24 ENCOUNTER — Other Ambulatory Visit: Payer: Self-pay | Admitting: Family Medicine

## 2017-11-25 NOTE — Telephone Encounter (Signed)
Patient last saw Dr. Adriana Simas on 12-23-15 and has an appt scheduled with Dr. Shirlee Latch on 12-17-17.  This medications was last filled on 09-28-16.

## 2017-12-02 ENCOUNTER — Encounter: Payer: Self-pay | Admitting: Internal Medicine

## 2017-12-03 MED ORDER — OLMESARTAN-AMLODIPINE-HCTZ 40-10-12.5 MG PO TABS: 1.0000 | ORAL_TABLET | Freq: Every day | ORAL | 0 refills | Status: DC

## 2017-12-17 ENCOUNTER — Encounter: Payer: Managed Care, Other (non HMO) | Admitting: Internal Medicine

## 2017-12-29 ENCOUNTER — Telehealth: Payer: Self-pay | Admitting: Internal Medicine

## 2017-12-29 NOTE — Telephone Encounter (Signed)
Pt needs to sch appt for any refills of medications has been > 1 year since been seen at this clinic   TMS

## 2018-01-05 NOTE — Telephone Encounter (Signed)
Left message for patient to return cal to office. PEC may advise patient and schedule.

## 2018-01-11 ENCOUNTER — Other Ambulatory Visit: Payer: Self-pay | Admitting: Endocrinology

## 2018-01-19 ENCOUNTER — Other Ambulatory Visit: Payer: Self-pay

## 2018-01-19 ENCOUNTER — Other Ambulatory Visit: Payer: Self-pay | Admitting: Internal Medicine

## 2018-01-19 ENCOUNTER — Encounter: Payer: Self-pay | Admitting: Endocrinology

## 2018-01-19 ENCOUNTER — Other Ambulatory Visit: Payer: Self-pay | Admitting: Endocrinology

## 2018-01-19 MED ORDER — FREESTYLE LIBRE 14 DAY READER DEVI: 1.0000 | 0 refills | Status: DC

## 2018-01-19 MED ORDER — OLMESARTAN-AMLODIPINE-HCTZ 40-10-12.5 MG PO TABS: 1.0000 | ORAL_TABLET | Freq: Every day | ORAL | 0 refills | Status: DC

## 2018-01-19 MED ORDER — GLUCOSE BLOOD VI STRP: ORAL_STRIP | 3 refills | Status: DC

## 2018-01-19 MED ORDER — FENOFIBRATE 145 MG PO TABS: 145.0000 mg | ORAL_TABLET | Freq: Every day | ORAL | 0 refills | Status: DC

## 2018-01-27 ENCOUNTER — Encounter: Payer: Self-pay | Admitting: Family Medicine

## 2018-02-06 ENCOUNTER — Other Ambulatory Visit: Payer: Self-pay | Admitting: Endocrinology

## 2018-02-09 ENCOUNTER — Ambulatory Visit: Payer: Managed Care, Other (non HMO) | Admitting: Endocrinology

## 2018-02-09 DIAGNOSIS — Z0289 Encounter for other administrative examinations: Secondary | ICD-10-CM

## 2018-02-15 ENCOUNTER — Other Ambulatory Visit: Payer: Self-pay

## 2018-02-15 MED ORDER — OLMESARTAN-AMLODIPINE-HCTZ 40-10-12.5 MG PO TABS: 1.0000 | ORAL_TABLET | Freq: Every day | ORAL | 0 refills | Status: DC

## 2018-02-18 ENCOUNTER — Other Ambulatory Visit: Payer: Self-pay | Admitting: Endocrinology

## 2018-03-07 ENCOUNTER — Other Ambulatory Visit: Payer: Self-pay | Admitting: Endocrinology

## 2018-03-23 ENCOUNTER — Other Ambulatory Visit: Payer: Self-pay | Admitting: Internal Medicine

## 2018-03-23 DIAGNOSIS — I10 Essential (primary) hypertension: Secondary | ICD-10-CM

## 2018-03-23 MED ORDER — OLMESARTAN-AMLODIPINE-HCTZ 40-10-12.5 MG PO TABS: 1.0000 | ORAL_TABLET | Freq: Every day | ORAL | 0 refills | Status: DC

## 2018-04-01 ENCOUNTER — Encounter: Payer: Self-pay | Admitting: Internal Medicine

## 2018-04-01 ENCOUNTER — Ambulatory Visit (INDEPENDENT_AMBULATORY_CARE_PROVIDER_SITE_OTHER): Payer: Managed Care, Other (non HMO) | Admitting: Internal Medicine

## 2018-04-01 ENCOUNTER — Ambulatory Visit (INDEPENDENT_AMBULATORY_CARE_PROVIDER_SITE_OTHER): Payer: Managed Care, Other (non HMO)

## 2018-04-01 VITALS — BP 130/96 | HR 97 | Temp 98.4°F | Ht 73.0 in | Wt 188.9 lb

## 2018-04-01 DIAGNOSIS — I10 Essential (primary) hypertension: Secondary | ICD-10-CM

## 2018-04-01 DIAGNOSIS — Z1159 Encounter for screening for other viral diseases: Secondary | ICD-10-CM

## 2018-04-01 DIAGNOSIS — M25512 Pain in left shoulder: Secondary | ICD-10-CM | POA: Diagnosis not present

## 2018-04-01 DIAGNOSIS — M79671 Pain in right foot: Secondary | ICD-10-CM

## 2018-04-01 DIAGNOSIS — Z794 Long term (current) use of insulin: Secondary | ICD-10-CM

## 2018-04-01 DIAGNOSIS — Z0184 Encounter for antibody response examination: Secondary | ICD-10-CM

## 2018-04-01 DIAGNOSIS — Z1329 Encounter for screening for other suspected endocrine disorder: Secondary | ICD-10-CM

## 2018-04-01 DIAGNOSIS — E559 Vitamin D deficiency, unspecified: Secondary | ICD-10-CM

## 2018-04-01 DIAGNOSIS — Z125 Encounter for screening for malignant neoplasm of prostate: Secondary | ICD-10-CM

## 2018-04-01 DIAGNOSIS — E782 Mixed hyperlipidemia: Secondary | ICD-10-CM

## 2018-04-01 DIAGNOSIS — Z23 Encounter for immunization: Secondary | ICD-10-CM

## 2018-04-01 DIAGNOSIS — E119 Type 2 diabetes mellitus without complications: Secondary | ICD-10-CM

## 2018-04-01 MED ORDER — OLMESARTAN-AMLODIPINE-HCTZ 40-10-12.5 MG PO TABS: 1.0000 | ORAL_TABLET | Freq: Every day | ORAL | 3 refills | Status: DC

## 2018-04-01 NOTE — Progress Notes (Signed)
Chief Complaint  Patient presents with  . Follow-up   TOC  1. HTN missed med olmesartan-norvasc-hctz 40-10-12.5 x 2 days on coreg 25 mg bid  2. DM 2 with insulin pump on metformin 750 mg and invokana 100 mg qd f/u endocrine Dr. Lucianne Muss  3. C/o left shoulder pain new 3-4/10 tylenol helps pain with ROM and upward motion of any ROM hurts  4. C/o right foot pain lateral foot new worse with plantar flexion 2-3/10 increasing in freq tylenol does not help    Review of Systems  Constitutional: Negative for weight loss.  HENT: Negative for hearing loss.   Eyes: Negative for blurred vision.  Respiratory: Negative for shortness of breath.   Cardiovascular: Negative for chest pain.  Gastrointestinal: Negative for abdominal pain.  Musculoskeletal: Positive for joint pain.  Skin: Negative for rash.  Neurological: Negative for headaches.  Psychiatric/Behavioral: Negative for depression.   Past Medical History:  Diagnosis Date  . Anxiety   . Depression   . Diabetes mellitus without complication (HCC)    Type 2  . DKA (diabetic ketoacidoses) (HCC)   . Heart murmur   . Hypertension   . Sleep apnea    No past surgical history on file. Family History  Problem Relation Age of Onset  . Diabetes Father   . Hypertension Father   . Diabetes Brother   . Hypertension Mother   . Hypertension Maternal Grandmother    Social History   Socioeconomic History  . Marital status: Married    Spouse name: Not on file  . Number of children: Not on file  . Years of education: Not on file  . Highest education level: Not on file  Occupational History  . Not on file  Social Needs  . Financial resource strain: Not on file  . Food insecurity:    Worry: Not on file    Inability: Not on file  . Transportation needs:    Medical: Not on file    Non-medical: Not on file  Tobacco Use  . Smoking status: Never Smoker  . Smokeless tobacco: Never Used  Substance and Sexual Activity  . Alcohol use: Yes  .  Drug use: No  . Sexual activity: Not on file  Lifestyle  . Physical activity:    Days per week: Not on file    Minutes per session: Not on file  . Stress: Not on file  Relationships  . Social connections:    Talks on phone: Not on file    Gets together: Not on file    Attends religious service: Not on file    Active member of club or organization: Not on file    Attends meetings of clubs or organizations: Not on file    Relationship status: Not on file  . Intimate partner violence:    Fear of current or ex partner: Not on file    Emotionally abused: Not on file    Physically abused: Not on file    Forced sexual activity: Not on file  Other Topics Concern  . Not on file  Social History Narrative  . Not on file   Current Meds  Medication Sig  . atorvastatin (LIPITOR) 40 MG tablet Take 1 tablet (40 mg total) by mouth daily.  . carvedilol (COREG) 25 MG tablet TAKE 1 TABLET (25 MG TOTAL) BY MOUTH 2 (TWO) TIMES DAILY WITH A MEAL.  Marland Kitchen Continuous Blood Gluc Receiver (FREESTYLE LIBRE 14 DAY READER) DEVI 1 each by Does not apply  route as directed.  . Continuous Blood Gluc Sensor (FREESTYLE LIBRE 14 DAY SENSOR) MISC 1 each by Does not apply route every 14 (fourteen) days.  . CONTOUR NEXT TEST test strip USE AS INSTRUCTED TO CHECK BLOOD SUGAR 6 TIMES DAILY.  Marland Kitchen Insulin Disposable Pump (OMNIPOD DASH 5 PACK) MISC CHANGE Q 72 H UTD  . INVOKANA 100 MG TABS tablet TAKE 1 TABLET DAILY BEFORE BREAKFAST  . Lancets (FREESTYLE) lancets Use as instructed to check blood sugar 6 times per day dx code E11.65  . metFORMIN (GLUCOPHAGE-XR) 750 MG 24 hr tablet TAKE 2 TABLETS (1,500 MG TOTAL) BY MOUTH DAILY WITH BREAKFAST.  Marland Kitchen Olmesartan-amLODIPine-HCTZ 40-10-12.5 MG TABS Take 1 tablet by mouth daily.  Marland Kitchen triamcinolone cream (KENALOG) 0.1 % Apply 1 application topically 2 (two) times daily.  . [DISCONTINUED] Olmesartan-amLODIPine-HCTZ 40-10-12.5 MG TABS Take 1 tablet by mouth daily. Patient needs appointment to  receive further refills.   No Known Allergies No results found for this or any previous visit (from the past 2160 hour(s)). Objective  Body mass index is 24.92 kg/m. Wt Readings from Last 3 Encounters:  04/01/18 188 lb 14.4 oz (85.7 kg)  11/08/17 189 lb 3.2 oz (85.8 kg)  07/26/17 193 lb 12.8 oz (87.9 kg)   Temp Readings from Last 3 Encounters:  04/01/18 98.4 F (36.9 C) (Oral)  12/03/16 98.4 F (36.9 C) (Oral)  08/25/16 98.4 F (36.9 C) (Oral)   BP Readings from Last 3 Encounters:  04/01/18 (!) 130/96  11/08/17 130/84  07/26/17 136/88   Pulse Readings from Last 3 Encounters:  04/01/18 97  11/08/17 77  07/26/17 69    Physical Exam  Constitutional: He is oriented to person, place, and time. Vital signs are normal. He appears well-developed and well-nourished. He is cooperative.  HENT:  Head: Normocephalic and atraumatic.  Mouth/Throat: Oropharynx is clear and moist and mucous membranes are normal.  Eyes: Pupils are equal, round, and reactive to light. Conjunctivae are normal.  Cardiovascular: Normal rate, regular rhythm and normal heart sounds.  Pulmonary/Chest: Effort normal and breath sounds normal.  Musculoskeletal:       Left shoulder: He exhibits tenderness.       Feet:  Neurological: He is alert and oriented to person, place, and time. Gait normal.  Skin: Skin is warm, dry and intact.  Psychiatric: He has a normal mood and affect. His speech is normal and behavior is normal. Judgment and thought content normal. Cognition and memory are normal.  Nursing note and vitals reviewed.   Assessment   1. HTN 2. DM 2 A1C 6.7 11/08/17  3. Left shoulder pain and right lateral foot pain  4. HM Plan   1. Cont meds refilled  2.  On insulin pump, cont metformin and invokana  Foot exam today normal monofilament 2+DP and PT pulses  Ask about eye exam at f/u  F/u endocrine  sch fasting labs  3. Xray left shoulder and right foot  Prn Tylenol referred to D.r Poggi  4.    Flu shot today  Consider Tdap and pna 23 vx in future  sch fasting labs  Declines STD check    Provider: Dr. French Ana McLean-Scocuzza-[facility]

## 2018-04-01 NOTE — Progress Notes (Signed)
Pre visit review using our clinic review tool, if applicable. No additional management support is needed unless otherwise documented below in the visit note. 

## 2018-04-01 NOTE — Patient Instructions (Addendum)
Think about Tdap vaccine   Pneumonia 23 vaccine is needed as well   Take lipitor at night   D.r Poggi left shoulder     Shoulder Pain Many things can cause shoulder pain, including:  An injury to the area.  Overuse of the shoulder.  Arthritis.  The source of the pain can be:  Inflammation.  An injury to the shoulder joint.  An injury to a tendon, ligament, or bone.  Follow these instructions at home: Take these actions to help with your pain:  Squeeze a soft ball or a foam pad as much as possible. This helps to keep the shoulder from swelling. It also helps to strengthen the arm.  Take over-the-counter and prescription medicines only as told by your health care provider.  If directed, apply ice to the area: ? Put ice in a plastic bag. ? Place a towel between your skin and the bag. ? Leave the ice on for 20 minutes, 2-3 times per day. Stop applying ice if it does not help with the pain.  If you were given a shoulder sling or immobilizer: ? Wear it as told. ? Remove it to shower or bathe. ? Move your arm as little as possible, but keep your hand moving to prevent swelling.  Contact a health care provider if:  Your pain gets worse.  Your pain is not relieved with medicines.  New pain develops in your arm, hand, or fingers. Get help right away if:  Your arm, hand, or fingers: ? Tingle. ? Become numb. ? Become swollen. ? Become painful. ? Turn white or blue. This information is not intended to replace advice given to you by your health care provider. Make sure you discuss any questions you have with your health care provider. Document Released: 03/25/2005 Document Revised: 02/09/2016 Document Reviewed: 10/08/2014 Elsevier Interactive Patient Education  2018 Else[employer] Inc.  Pneumococcal Polysaccharide Vaccine: What You Need to Know 1. Why get vaccinated? Vaccination can protect older adults (and some children and younger adults) from pneumococcal  disease. Pneumococcal disease is caused by bacteria that can spread from person to person through close contact. It can cause ear infections, and it can also lead to more serious infections of the:  Lungs (pneumonia),  Blood (bacteremia), and  Covering of the brain and spinal cord (meningitis). Meningitis can cause deafness and brain damage, and it can be fatal.  Anyone can get pneumococcal disease, but children under 16 years of age, people with certain medical conditions, adults over 85 years of age, and cigarette smokers are at the highest risk. About 18,000 older adults die each year from pneumococcal disease in the [place]. Treatment of pneumococcal infections with penicillin and other drugs used to be more effective. But some strains of the disease have become resistant to these drugs. This makes prevention of the disease, through vaccination, even more important. 2. Pneumococcal polysaccharide vaccine (PPSV23) Pneumococcal polysaccharide vaccine (PPSV23) protects against 23 types of pneumococcal bacteria. It will not prevent all pneumococcal disease. PPSV23 is recommended for:  All adults 33 years of age and older,  Anyone 2 through 48 years of age with certain long-term health problems,  Anyone 2 through 48 years of age with a weakened immune system,  Adults 84 through 48 years of age who smoke cigarettes or have asthma.  Most people need only one dose of PPSV. A second dose is recommended for certain high-risk groups. People 21 and older should get a dose even if they have gotten  one or more doses of the vaccine before they turned 65. Your healthcare provider can give you more information about these recommendations. Most healthy adults develop protection within 2 to 3 weeks of getting the shot. 3. Some people should not get this vaccine  Anyone who has had a life-threatening allergic reaction to PPSV should not get another dose.  Anyone who has a severe allergy to any  component of PPSV should not receive it. Tell your provider if you have any severe allergies.  Anyone who is moderately or severely ill when the shot is scheduled may be asked to wait until they recover before getting the vaccine. Someone with a mild illness can usually be vaccinated.  Children less than 35 years of age should not receive this vaccine.  There is no evidence that PPSV is harmful to either a pregnant woman or to her fetus. However, as a precaution, women who need the vaccine should be vaccinated before becoming pregnant, if possible. 4. Risks of a vaccine reaction With any medicine, including vaccines, there is a chance of side effects. These are usually mild and go away on their own, but serious reactions are also possible. About half of people who get PPSV have mild side effects, such as redness or pain where the shot is given, which go away within about two days. Less than 1 out of 100 people develop a fever, muscle aches, or more severe local reactions. Problems that could happen after any vaccine:  People sometimes faint after a medical procedure, including vaccination. Sitting or lying down for about 15 minutes can help prevent fainting, and injuries caused by a fall. Tell your doctor if you feel dizzy, or have vision changes or ringing in the ears.  Some people get severe pain in the shoulder and have difficulty moving the arm where a shot was given. This happens very rarely.  Any medication can cause a severe allergic reaction. Such reactions from a vaccine are very rare, estimated at about 1 in a million doses, and would happen within a few minutes to a few hours after the vaccination. As with any medicine, there is a very remote chance of a vaccine causing a serious injury or death. The safety of vaccines is always being monitored. For more information, visit: http://floyd.org/ 5. What if there is a serious reaction? What should I look for? Look for anything  that concerns you, such as signs of a severe allergic reaction, very high fever, or unusual behavior. Signs of a severe allergic reaction can include hives, swelling of the face and throat, difficulty breathing, a fast heartbeat, dizziness, and weakness. These would usually start a few minutes to a few hours after the vaccination. What should I do? If you think it is a severe allergic reaction or other emergency that can't wait, call 9-1-1 or get to the nearest hospital. Otherwise, call your doctor. Afterward, the reaction should be reported to the Vaccine Adverse Event Reporting System (VAERS). Your doctor might file this report, or you can do it yourself through the VAERS web site at www.vaers.LAgents.no, or by calling 1-336 467 5662. VAERS does not give medical advice. 6. How can I learn more?  Ask your doctor. He or she can give you the vaccine package insert or suggest other sources of information.  Call your local or state health department.  Contact the Centers for Disease Control and Prevention (CDC): ? Call 403-526-4336 (1-800-CDC-INFO) or ? Visit CDC's website at PicCapture.uy CDC Pneumococcal Polysaccharide Vaccine VIS (10/20/13) This  information is not intended to replace advice given to you by your health care provider. Make sure you discuss any questions you have with your health care provider. Document Released: 04/12/2006 Document Revised: 03/05/2016 Document Reviewed: 03/05/2016 Elsevier Interactive Patient Education  2017 Elsevier Inc.  DTaP Vaccine (Diphtheria, Tetanus, and Pertussis): What You Need to Know 1. Why get vaccinated? Diphtheria, tetanus, and pertussis are serious diseases caused by bacteria. Diphtheria and pertussis are spread from person to person. Tetanus enters the body through cuts or wounds. DIPHTHERIA causes a thick covering in the back of the throat.  It can lead to breathing problems, paralysis, heart failure, and even death.  TETANUS (Lockjaw)  causes painful tightening of the muscles, usually all over the body.  It can lead to "locking" of the jaw so the victim cannot open his mouth or swallow. Tetanus leads to death in up to 2 out of 10 cases.  PERTUSSIS (Whooping Cough) causes coughing spells so bad that it is hard for infants to eat, drink, or breathe. These spells can last for weeks.  It can lead to pneumonia, seizures (jerking and staring spells), brain damage, and death.  Diphtheria, tetanus, and pertussis vaccine (DTaP) can help prevent these diseases. Most children who are vaccinated with DTaP will be protected throughout childhood. Many more children would get these diseases if we stopped vaccinating. DTaP is a safer version of an older vaccine called DTP. DTP is no longer used in the [place]. 2. Who should get DTaP vaccine and when? Children should get 5 doses of DTaP vaccine, one dose at each of the following ages:  2 months  4 months  6 months  15-18 months  4-6 years  DTaP may be given at the same time as other vaccines. 3. Some children should not get DTaP vaccine or should wait  Children with minor illnesses, such as a cold, may be vaccinated. But children who are moderately or severely ill should usually wait until they recover before getting DTaP vaccine.  Any child who had a life-threatening allergic reaction after a dose of DTaP should not get another dose.  Any child who suffered a brain or nervous system disease within 7 days after a dose of DTaP should not get another dose.  Talk with your doctor if your child: ? had a seizure or collapsed after a dose of DTaP, ? cried non-stop for 3 hours or more after a dose of DTaP, ? had a fever over 105F after a dose of DTaP. Ask your doctor for more information. Some of these children should not get another dose of pertussis vaccine, but may get a vaccine without pertussis, called DT. 4. Older children and adults DTaP is not licensed for adolescents,  adults, or children 79 years of age and older. But older people still need protection. A vaccine called Tdap is similar to DTaP. A single dose of Tdap is recommended for people 11 through 48 years of age. Another vaccine, called Td, protects against tetanus and diphtheria, but not pertussis. It is recommended every 10 years. There are separate Vaccine Information Statements for these vaccines. 5. What are the risks from DTaP vaccine? Getting diphtheria, tetanus, or pertussis disease is much riskier than getting DTaP vaccine. However, a vaccine, like any medicine, is capable of causing serious problems, such as severe allergic reactions. The risk of DTaP vaccine causing serious harm, or death, is extremely small. Mild problems (common)  Fever (up to about 1 child in 4)  Redness  or swelling where the shot was given (up to about 1 child in 4)  Soreness or tenderness where the shot was given (up to about 1 child in 4) These problems occur more often after the 4th and 5th doses of the DTaP series than after earlier doses. Sometimes the 4th or 5th dose of DTaP vaccine is followed by swelling of the entire arm or leg in which the shot was given, lasting 1-7 days (up to about 1 child in 30). Other mild problems include:  Fussiness (up to about 1 child in 3)  Tiredness or poor appetite (up to about 1 child in 10)  Vomiting (up to about 1 child in 50) These problems generally occur 1-3 days after the shot. Moderate problems (uncommon)  Seizure (jerking or staring) (about 1 child out of 14,000)  Non-stop crying, for 3 hours or more (up to about 1 child out of 1,000)  High fever, over 105F (about 1 child out of 16,000) Severe problems (very rare)  Serious allergic reaction (less than 1 out of a million doses)  Several other severe problems have been reported after DTaP vaccine. These include: ? Long-term seizures, coma, or lowered consciousness ? Permanent brain damage. These are so rare it is  hard to tell if they are caused by the vaccine. Controlling fever is especially important for children who have had seizures, for any reason. It is also important if another family member has had seizures. You can reduce fever and pain by giving your child an aspirin-free pain reliever when the shot is given, and for the next 24 hours, following the package instructions. 6. What if there is a serious reaction? What should I look for? Look for anything that concerns you, such as signs of a severe allergic reaction, very high fever, or behavior changes. Signs of a severe allergic reaction can include hives, swelling of the face and throat, difficulty breathing, a fast heartbeat, dizziness, and weakness. These would start a few minutes to a few hours after the vaccination. What should I do?  If you think it is a severe allergic reaction or other emergency that can't wait, call 9-1-1 or get the person to the nearest hospital. Otherwise, call your doctor.  Afterward, the reaction should be reported to the Vaccine Adverse Event Reporting System (VAERS). Your doctor might file this report, or you can do it yourself through the VAERS web site at www.vaers.LAgents.no, or by calling 1-636 599 0519. ? VAERS is only for reporting reactions. They do not give medical advice. 7. The National Vaccine Injury Compensation Program The [employer] Vaccine Injury Compensation Program (VICP) is a federal program that was created to compensate people who may have been injured by certain vaccines. Persons who believe they may have been injured by a vaccine can learn about the program and about filing a claim by calling 1-(450)514-9141 or visiting the VICP website at SpiritualWord.at. 8. How can I learn more?  Ask your doctor.  Call your local or state health department.  Contact the Centers for Disease Control and Prevention (CDC): ? Call 725 334 0009 (1-800-CDC-INFO) or ? Visit CDC's website at  PicCapture.uy CDC DTaP Vaccine (Diphtheria, Tetanus, and Pertussis) VIS (11/12/05) This information is not intended to replace advice given to you by your health care provider. Make sure you discuss any questions you have with your health care provider. Document Released: 04/12/2006 Document Revised: 03/05/2016 Document Reviewed: 03/05/2016 Elsevier Interactive Patient Education  2017 [employer].

## 2018-04-05 ENCOUNTER — Other Ambulatory Visit: Payer: Self-pay | Admitting: Internal Medicine

## 2018-04-05 DIAGNOSIS — M79671 Pain in right foot: Secondary | ICD-10-CM

## 2018-04-11 ENCOUNTER — Other Ambulatory Visit (INDEPENDENT_AMBULATORY_CARE_PROVIDER_SITE_OTHER): Payer: Managed Care, Other (non HMO)

## 2018-04-11 ENCOUNTER — Telehealth: Payer: Self-pay | Admitting: Endocrinology

## 2018-04-11 ENCOUNTER — Other Ambulatory Visit: Payer: Self-pay | Admitting: Endocrinology

## 2018-04-11 DIAGNOSIS — Z794 Long term (current) use of insulin: Secondary | ICD-10-CM

## 2018-04-11 DIAGNOSIS — E559 Vitamin D deficiency, unspecified: Secondary | ICD-10-CM

## 2018-04-11 DIAGNOSIS — E119 Type 2 diabetes mellitus without complications: Secondary | ICD-10-CM

## 2018-04-11 DIAGNOSIS — Z125 Encounter for screening for malignant neoplasm of prostate: Secondary | ICD-10-CM

## 2018-04-11 DIAGNOSIS — Z1159 Encounter for screening for other viral diseases: Secondary | ICD-10-CM

## 2018-04-11 DIAGNOSIS — I10 Essential (primary) hypertension: Secondary | ICD-10-CM

## 2018-04-11 DIAGNOSIS — Z0184 Encounter for antibody response examination: Secondary | ICD-10-CM

## 2018-04-11 DIAGNOSIS — Z1329 Encounter for screening for other suspected endocrine disorder: Secondary | ICD-10-CM

## 2018-04-11 NOTE — Addendum Note (Signed)
Addended by: Penne Lash on: 04/11/2018 08:38 AM   Modules accepted: Orders

## 2018-04-11 NOTE — Telephone Encounter (Signed)
error 

## 2018-04-11 NOTE — Telephone Encounter (Signed)
Patient requests RX for [facility] Lifestyle Sensor sent to  [id number] [place], [place] - [address]

## 2018-04-11 NOTE — Telephone Encounter (Signed)
This has been done.

## 2018-04-12 ENCOUNTER — Encounter: Payer: Self-pay | Admitting: Internal Medicine

## 2018-04-12 LAB — CBC WITH DIFFERENTIAL/PLATELET
Basophils Absolute: 53 cells/uL (ref 0–200)
Basophils Relative: 1.1 %
Eosinophils Absolute: 456 cells/uL (ref 15–500)
Eosinophils Relative: 9.5 %
HCT: 41.8 % (ref 38.5–50.0)
Hemoglobin: 14.4 g/dL (ref 13.2–17.1)
Lymphs Abs: 1891 cells/uL (ref 850–3900)
MCH: 30.2 pg (ref 27.0–33.0)
MCHC: 34.4 g/dL (ref 32.0–36.0)
MCV: 87.6 fL (ref 80.0–100.0)
MONOS PCT: 6.1 %
MPV: 13.1 fL — ABNORMAL HIGH (ref 7.5–12.5)
NEUTROS PCT: 43.9 %
Neutro Abs: 2107 cells/uL (ref 1500–7800)
PLATELETS: 220 10*3/uL (ref 140–400)
RBC: 4.77 10*6/uL (ref 4.20–5.80)
RDW: 12.4 % (ref 11.0–15.0)
TOTAL LYMPHOCYTE: 39.4 %
WBC: 4.8 10*3/uL (ref 3.8–10.8)
WBCMIX: 293 {cells}/uL (ref 200–950)

## 2018-04-12 LAB — COMPREHENSIVE METABOLIC PANEL
AG RATIO: 1.7 (calc) (ref 1.0–2.5)
ALT: 18 U/L (ref 9–46)
AST: 16 U/L (ref 10–40)
Albumin: 4.7 g/dL (ref 3.6–5.1)
Alkaline phosphatase (APISO): 49 U/L (ref 40–115)
BILIRUBIN TOTAL: 0.7 mg/dL (ref 0.2–1.2)
BUN: 15 mg/dL (ref 7–25)
CALCIUM: 9.5 mg/dL (ref 8.6–10.3)
CHLORIDE: 101 mmol/L (ref 98–110)
CO2: 24 mmol/L (ref 20–32)
Creat: 0.98 mg/dL (ref 0.60–1.35)
GLOBULIN: 2.8 g/dL (ref 1.9–3.7)
GLUCOSE: 70 mg/dL (ref 65–99)
Potassium: 3.4 mmol/L — ABNORMAL LOW (ref 3.5–5.3)
SODIUM: 139 mmol/L (ref 135–146)
TOTAL PROTEIN: 7.5 g/dL (ref 6.1–8.1)

## 2018-04-12 LAB — HEMOGLOBIN A1C
Hgb A1c MFr Bld: 6.5 % of total Hgb — ABNORMAL HIGH (ref <5.7)
Mean Plasma Glucose: 140 (calc)
eAG (mmol/L): 7.7 (calc)

## 2018-04-12 LAB — LIPID PANEL
CHOLESTEROL: 178 mg/dL (ref <200)
HDL: 47 mg/dL (ref >40)
LDL CHOLESTEROL (CALC): 107 mg/dL — AB
Non-HDL Cholesterol (Calc): 131 mg/dL (calc) — ABNORMAL HIGH (ref <130)
TRIGLYCERIDES: 126 mg/dL (ref <150)
Total CHOL/HDL Ratio: 3.8 (calc) (ref <5.0)

## 2018-04-12 LAB — PSA: PSA: 0.7 ng/mL (ref <=4.0)

## 2018-04-12 LAB — MEASLES/MUMPS/RUBELLA IMMUNITY
Mumps IgG: <9 AU/mL — ABNORMAL LOW
RUBELLA: 2.06 {index}
Rubeola IgG: 43.9 AU/mL

## 2018-04-12 LAB — TSH: TSH: 2.23 m[IU]/L (ref 0.40–4.50)

## 2018-04-12 LAB — HEPATITIS B SURFACE ANTIBODY, QUANTITATIVE: Hepatitis B-Post: >1000 m[IU]/mL (ref >=10)

## 2018-04-12 LAB — VITAMIN D 25 HYDROXY (VIT D DEFICIENCY, FRACTURES): VIT D 25 HYDROXY: 30 ng/mL (ref 30–100)

## 2018-04-13 ENCOUNTER — Other Ambulatory Visit: Payer: Managed Care, Other (non HMO)

## 2018-04-15 DIAGNOSIS — M7582 Other shoulder lesions, left shoulder: Secondary | ICD-10-CM | POA: Insufficient documentation

## 2018-05-03 ENCOUNTER — Other Ambulatory Visit: Payer: Self-pay | Admitting: Podiatry

## 2018-05-03 ENCOUNTER — Encounter: Payer: Self-pay | Admitting: Podiatry

## 2018-05-03 ENCOUNTER — Ambulatory Visit (INDEPENDENT_AMBULATORY_CARE_PROVIDER_SITE_OTHER): Payer: Managed Care, Other (non HMO) | Admitting: Podiatry

## 2018-05-03 ENCOUNTER — Ambulatory Visit (INDEPENDENT_AMBULATORY_CARE_PROVIDER_SITE_OTHER): Payer: Managed Care, Other (non HMO)

## 2018-05-03 VITALS — BP 164/97 | HR 71

## 2018-05-03 DIAGNOSIS — G5791 Unspecified mononeuropathy of right lower limb: Secondary | ICD-10-CM | POA: Diagnosis not present

## 2018-05-03 DIAGNOSIS — M79671 Pain in right foot: Secondary | ICD-10-CM

## 2018-05-03 MED ORDER — GABAPENTIN 100 MG PO CAPS: 100.0000 mg | ORAL_CAPSULE | Freq: Every day | ORAL | 0 refills | Status: DC

## 2018-05-05 ENCOUNTER — Other Ambulatory Visit: Payer: Self-pay | Admitting: Endocrinology

## 2018-05-05 NOTE — Progress Notes (Signed)
   HPI: 47 year old male presenting today as a new patient with a chief complaint of shooting pain to the lateral right foot that began a few months ago. He reports associated radiating pain up the right lower extremity. He has not done anything for treatment and denies modifying factors. Patient is here for further evaluation and treatment.   Past Medical History:  Diagnosis Date  . Anxiety   . Depression   . Diabetes mellitus without complication (HCC)    Type 2  . DKA (diabetic ketoacidoses) (HCC)   . Heart murmur   . Hypertension   . Sleep apnea      Physical Exam: General: The patient is alert and oriented x3 in no acute distress.  Dermatology: Skin is warm, dry and supple bilateral lower extremities. Negative for open lesions or macerations.  Vascular: Palpable pedal pulses bilaterally. No edema or erythema noted. Capillary refill within normal limits.  Neurological: Epicritic and protective threshold grossly intact bilaterally.   Musculoskeletal Exam: Range of motion within normal limits to all pedal and ankle joints bilateral. Muscle strength 5/5 in all groups bilateral.   Radiographic Exam:  Normal osseous mineralization. Joint spaces preserved. No fracture/dislocation/boney destruction.    Assessment: 1. Neuritis right lateral foot    Plan of Care:  1. Patient evaluated. X-Rays from 04/01/18 reviewed.  2. Recommended wide fitting shoes.  3. Prescription for Gabapentin 100 mg every night at bedtime #60 provided to patient.  4. Return to clinic as needed.       Felecia Shelling, DPM [facility]  Dr. Felecia Shelling, DPM    [address] [place], [place] [address]                Office [phone]  Fax [phone]

## 2018-05-09 ENCOUNTER — Ambulatory Visit (INDEPENDENT_AMBULATORY_CARE_PROVIDER_SITE_OTHER): Payer: Managed Care, Other (non HMO) | Admitting: Family Medicine

## 2018-05-09 ENCOUNTER — Encounter: Payer: Self-pay | Admitting: Family Medicine

## 2018-05-09 VITALS — BP 146/90 | HR 92 | Temp 99.0°F | Ht 73.0 in | Wt 183.8 lb

## 2018-05-09 DIAGNOSIS — M62830 Muscle spasm of back: Secondary | ICD-10-CM

## 2018-05-09 DIAGNOSIS — M545 Low back pain, unspecified: Secondary | ICD-10-CM

## 2018-05-09 DIAGNOSIS — S39012A Strain of muscle, fascia and tendon of lower back, initial encounter: Secondary | ICD-10-CM | POA: Diagnosis not present

## 2018-05-09 MED ORDER — METHYLPREDNISOLONE 4 MG PO TBPK: ORAL_TABLET | ORAL | 0 refills | Status: DC

## 2018-05-09 MED ORDER — CYCLOBENZAPRINE HCL 5 MG PO TABS: 5.0000 mg | ORAL_TABLET | Freq: Three times a day (TID) | ORAL | 1 refills | Status: DC | PRN

## 2018-05-09 NOTE — Patient Instructions (Signed)

## 2018-05-09 NOTE — Progress Notes (Signed)
Subjective:    Patient ID: Craig Johnson, male    DOB: September 19, 1970, 47 y.o.   MRN: [id number]  HPI  Presents to clinic c/o back pain for 2-3 days after helping his friends move; he was helping to carry and lift heavy items.  Patient avoids NSAIDs due to other medications he takes for diabetes and blood pressure.  He has taken some Tylenol and also currently is on gabapentin due to a foot issue, but has noticed the gabapentin seems to be helping his back somewhat as well.  Patient has not tried any topical muscle rubs or heating pad on the area.  Patient notices the pain when changing position from sit to stand and having to bend over.  Denies any lower extremity numbness.  Denies saddle anesthesia.  Denies loss of bowel or bladder control.  Patient Active Problem List   Diagnosis Date Noted  . Right foot pain 04/01/2018  . Acute pain of left shoulder 04/01/2018  . Erectile dysfunction 08/25/2016  . GERD (gastroesophageal reflux disease) 03/17/2015  . DM type 2 (diabetes mellitus, type 2) (HCC) 05/02/2014  . HTN (hypertension) 05/02/2014  . Hyperlipidemia 05/02/2014  . Mitral valve regurgitation 05/02/2014   Social History   Tobacco Use  . Smoking status: Never Smoker  . Smokeless tobacco: Never Used  Substance Use Topics  . Alcohol use: Yes    Review of Systems  Constitutional: Negative for chills, fatigue and fever.  HENT: Negative for congestion, ear pain, sinus pain and sore throat.   Eyes: Negative.   Respiratory: Negative for cough, shortness of breath and wheezing.   Cardiovascular: Negative for chest pain, palpitations and leg swelling.  Gastrointestinal: Negative for abdominal pain, diarrhea, nausea and vomiting.  Genitourinary: Negative for dysuria, frequency and urgency.  Musculoskeletal: +low back pain Skin: Negative for color change, pallor and rash.  Neurological: Negative for syncope, light-headedness and headaches.  Psychiatric/Behavioral: The patient is not  nervous/anxious.       Objective:   Physical Exam  Constitutional: He is oriented to person, place, and time. He appears well-nourished. No distress.  HENT:  Head: Normocephalic and atraumatic.  Neck: Neck supple. No tracheal deviation present.  Cardiovascular: Normal rate and regular rhythm.  Pulmonary/Chest: Effort normal and breath sounds normal. No respiratory distress. He has no wheezes. He has no rales.  Musculoskeletal: He exhibits no edema.       Back:  Red circle indicates area of pain/tenderness on low back. Straight leg raises with R & L legs cause pulling type pain across low back. Quadriceps strength and dorsi-plantar flexion equal and strong  Neurological: He is alert and oriented to person, place, and time.  Skin: Skin is warm and dry. No pallor.  Psychiatric: He has a normal mood and affect. His behavior is normal.  Nursing note and vitals reviewed.     Vitals:   05/09/18 1532 05/09/18 1540  BP: (!) 158/98 (!) 146/90  Pulse: 92   Temp: 99 F (37.2 C)   SpO2: 97%     Assessment & Plan:   Low back pain, muscle spasm and back, low back strain - patient symptoms are consistent with himself straining low back while helping friends move heavy items.  Patient will take a steroid taper and also muscle relaxer as needed to help calm muscle spasm.  Patient is aware that muscle relaxer can cause drowsiness, so was advised to not drive or operate heavy machinery after taking muscle relaxer.  Patient also advised to try  a heating pad on sore area, heat can help relax muscles and calm pain.  Patient given handout outlining low back stretches to help improve pain as well.  Work note given for out of work today and tomorrow due to patient's job requiring him to do some lifting.  Patient advised to follow-up with PCP as regularly scheduled.  He can return to clinic sooner if current symptoms persist or worsen, also can return to clinic at any time if new issues arise.

## 2018-06-23 ENCOUNTER — Ambulatory Visit: Payer: Managed Care, Other (non HMO) | Admitting: Endocrinology

## 2018-06-23 DIAGNOSIS — Z0289 Encounter for other administrative examinations: Secondary | ICD-10-CM

## 2018-08-10 ENCOUNTER — Other Ambulatory Visit: Payer: Self-pay | Admitting: Endocrinology

## 2018-08-23 ENCOUNTER — Telehealth: Payer: Self-pay | Admitting: Endocrinology

## 2018-08-23 NOTE — Telephone Encounter (Signed)
LMTCB and reschedule NS appt from 06/23/18

## 2018-09-09 ENCOUNTER — Other Ambulatory Visit: Payer: Self-pay | Admitting: Endocrinology

## 2018-09-20 ENCOUNTER — Other Ambulatory Visit: Payer: Self-pay

## 2018-09-20 NOTE — Telephone Encounter (Signed)
Last OV 11/08/17 refill or refuse please advise

## 2018-09-20 NOTE — Telephone Encounter (Signed)
Refuse with note to make appointment 

## 2018-09-22 ENCOUNTER — Other Ambulatory Visit: Payer: Self-pay

## 2018-09-22 ENCOUNTER — Ambulatory Visit (INDEPENDENT_AMBULATORY_CARE_PROVIDER_SITE_OTHER): Payer: Managed Care, Other (non HMO) | Admitting: Endocrinology

## 2018-09-22 ENCOUNTER — Encounter: Payer: Self-pay | Admitting: Endocrinology

## 2018-09-22 VITALS — BP 138/90 | HR 77 | Temp 99.0°F | Ht 73.0 in | Wt 184.8 lb

## 2018-09-22 DIAGNOSIS — E782 Mixed hyperlipidemia: Secondary | ICD-10-CM | POA: Diagnosis not present

## 2018-09-22 DIAGNOSIS — I10 Essential (primary) hypertension: Secondary | ICD-10-CM | POA: Diagnosis not present

## 2018-09-22 DIAGNOSIS — E1165 Type 2 diabetes mellitus with hyperglycemia: Secondary | ICD-10-CM

## 2018-09-22 DIAGNOSIS — Z794 Long term (current) use of insulin: Secondary | ICD-10-CM | POA: Diagnosis not present

## 2018-09-22 NOTE — Patient Instructions (Addendum)
Suspend for heavy work  Bolus for all carbs  Check blood sugars on waking up and before each meal  Also check blood sugars about 2 hours after meals and do this after different meals by rotation  Recommended blood sugar levels on waking up are 90-130 and about 2 hours after meal is 130-160  Please bring your blood sugar monitor to each visit, thank you  Invokana 2 pills then next Rx will 300mg 

## 2018-09-22 NOTE — Progress Notes (Signed)
Patient ID: Craig Johnson, male   DOB: May 09, 1971, 48 y.o.   MRN: [id number]           Reason for Appointment: Follow-up for Type 2 Diabetes  Referring physician: None  History of Present Illness:          Date of diagnosis of type 2 diabetes mellitus: 2013       Background history:  He probably had significantly high blood sugars at time of diagnosis but details are not available. He thinks he was tried on various oral medications initially and these did not improve his blood sugar (Metformin, Januvia, Farxiga) Probably was started on insulin about 3 months after diagnosis Apparently was on as much as 60 units of Lantus daily.   He was started on an insulin pump with the OMNIPOD on 05/20/15  Recent history:   Oral hypoglycemic drugs the patient is taking are: Invokana 100 mg daily, metformin ER 1500 mg daily       Omnipod PUMP settings :   Basal rate = 1.7 until 6 AM , 6 AM - 11 AM = 2.1 , 11 AM- 2 PM = 1 1.9, 2 PM-7 PM =  1.8 and 7 PM = 2.2  BOLUSES: carbohydrate ratio of 1:7,  Except 1:8 at lunch and correction factor 1:35 with target 120  His A1c is now higher at 7.3, previously 6.5   Current blood sugar patterns, management and problems identified since last visit  He continues to be noncompliant with all measures of self-care with diabetes  He has not been seen in follow-up in almost a year  Despite reminders he does not do any boluses on his pump at mealtime  Also his sensor download shows that he has been checking his blood sugars very regularly and mostly sporadically only in the mornings with only 20% of the time his data being available  He is out of his sensors currently  Although he is using his insulin pump he is only utilizing the basal rate  However has continued his Invokana and metformin  Currently appears to have variable fasting readings ranging from 113 up to about 300  Also some of his data does indicate postprandial spikes including at  breakfast and lunch  He also thinks that he will tend to get low blood sugars in the mornings when he is more active lifting boxes at work but otherwise is not as active.  He does not think about suspending or reducing his basal rate for this activity   Side effects from medications have been: None  Compliance with the medical regimen: Improved   Glucose monitoring:  As above          Glucometer:  Freestyle libre    Blood Glucose readings  CGM use % of time  20  2-week average/SD  160  Time in range      76 %  % Time Above 180  24  % Time above 250   % Time Below 70  0   Blood sugar readings as above FASTING average appears to be about 144   Self-care:    Previous diabetes education: with CDE with pump start Diet: He has variable intake and variable times               Exercise:  Mostly active in the mornings when he is working  Weight history:   Wt Readings from Last 3 Encounters:  09/22/18 184 lb 12.8 oz (83.8 kg)  05/09/18 183 lb  12.8 oz (83.4 kg)  04/01/18 188 lb 14.4 oz (85.7 kg)    Glycemic control:   Lab Results  Component Value Date   HGBA1C 7.3 (A) 09/23/2018   HGBA1C 6.5 (H) 04/11/2018   HGBA1C 6.7 11/08/2017   Lab Results  Component Value Date   MICROALBUR <0.7 06/09/2017   LDLCALC 107 (H) 04/11/2018   CREATININE 0.98 04/11/2018    Other active problems addressed: See review of systems   Allergies as of 09/22/2018      Reactions   Bee Venom Swelling      Medication List       Accurate as of September 22, 2018 11:59 PM. Always use your most recent med list.        atorvastatin 40 MG tablet Commonly known as:  LIPITOR Take 1 tablet (40 mg total) by mouth daily.   carvedilol 25 MG tablet Commonly known as:  COREG TAKE 1 TABLET (25 MG TOTAL) BY MOUTH 2 (TWO) TIMES DAILY WITH A MEAL.   freestyle lancets Use as instructed to check blood sugar 6 times per day dx code E11.65   FreeStyle Libre 14 Day Reader Hardie Pulley 1 each by Does not apply  route as directed.   FreeStyle Libre 14 Day Sensor Misc USE EVERY 14 DAYS AS DIRECTED   Invokana 100 MG Tabs tablet Generic drug:  canagliflozin TAKE 1 TABLET DAILY BEFORE BREAKFAST   metFORMIN 750 MG 24 hr tablet Commonly known as:  GLUCOPHAGE-XR TAKE 2 TABLETS (1,500 MG TOTAL) BY MOUTH DAILY WITH BREAKFAST.   Olmesartan-amLODIPine-HCTZ 40-10-12.5 MG Tabs Take 1 tablet by mouth daily.   OmniPod Dash 5 Pack Misc CHANGE Q 72 H UTD   triamcinolone cream 0.1 % Commonly known as:  KENALOG Apply 1 application topically 2 (two) times daily.       Allergies:  Allergies  Allergen Reactions   Bee Venom Swelling    Past Medical History:  Diagnosis Date   Anxiety    Depression    Diabetes mellitus without complication (HCC)    Type 2   DKA (diabetic ketoacidoses) (HCC)    Heart murmur    Hypertension    Sleep apnea     History reviewed. No pertinent surgical history.  Family History  Problem Relation Age of Onset   Diabetes Father    Hypertension Father    Diabetes Brother    Hypertension Mother    Hypertension Maternal Grandmother     Social History:  reports that he has never smoked. He has never used smokeless tobacco. He reports current alcohol use. He reports that he does not use drugs.     ROS  Hypertension:   He has had fairly significant hypertension which is difficult to control  Currently taking triple therapy with Benicar/amlodipine/HCTZ as well as Coreg 25 mg; Aldactone 50 mg was started in 12/18 but is currently not taking it  He does check blood pressure occasionally at home  He has had had  hypokalemia with HCTZ, last assessed by his PCP Also trial of clonidine patch was unsuccessful because of local itching.  His aldosterone/renin activity level was high and the salt loading test showed the 24-hour urine aldosterone to be 20.3 However his CT scan did not show any adrenal abnormality   BP Readings from Last 3 Encounters:    09/22/18 138/90  05/09/18 (!) 146/90  05/03/18 (!) 164/97   Lab Results  Component Value Date   CREATININE 0.98 04/11/2018   BUN 15 04/11/2018  NA 139 04/11/2018   K 3.4 (L) 04/11/2018   CL 101 04/11/2018   CO2 24 04/11/2018     Lipid history: Has been on 40 mg Lipitor as a statin drug He was previously taking 145 mg of fenofibrate from his PCP  Lab Results  Component Value Date   CHOL 178 04/11/2018   HDL 47 04/11/2018   LDLCALC 107 (H) 04/11/2018   LDLDIRECT 74.0 06/09/2017   TRIG 126 04/11/2018   CHOLHDL 3.8 04/11/2018    Most recent eye exam was 2018, he thinks he has been followed for glaucoma   Physical Examination:  BP 138/90 (BP Location: Left Arm, Patient Position: Sitting, Cuff Size: Normal)    Pulse 77    Temp 99 F (37.2 C) (Oral)    Ht  (1.854 m)    Wt 184 lb 12.8 oz (83.8 kg)    SpO2 97%    BMI 24.38 kg/m     ASSESSMENT:  Diabetes type 2, insulin-dependent, treated with Omni pod insulin pump See history of present illness for detailed discussion of current diabetes management, blood sugar patterns and problems identified  Blood sugar control is appearing to be improving with the insulin pump  A1c 7.3 even though previously it had been as low as 6.7  He has not been seen in follow-up for almost a year As above he is totally unmotivated to take care of his diabetes and his not doing any mealtime boluses, minimal monitoring  May be benefiting from Invokana since even without mealtime boluses his A1c is not significantly higher than before Also does not appear to be following instructions for various day-to-day measures including using temporary basal for his active periods at work to prevent hypoglycemia  HYPERTENSION:   Blood pressure was previously better with Aldactone which he is not taking Has been followed by PCP    HYPERLIPIDEMIA: Last lipid panel showed mild increase in LDL, has been seen by PCP  PLAN:    He will use the  freestyle Sensor to check blood sugar at least 3-4 times a day including after meals.  Showed him the print out and the fact that his data is not available for most of the day  Discussed treatment of postprandial hyperglycemia with consistent boluses  He will need to enter his carbohydrates and blood sugars every time he is eating a meal  Since he thinks he may be getting excessive carbohydrate coverage with current settings he can use a 1: 15 ratio for his carbohydrate coverage  He will suspend his pump for an hour when he is active in the mornings at work  Needs regular follow-up  Increase Invokana to 300 mg for better efficacy  Counseling time on subjects discussed in assessment and plan sections is over 50% of today's 25 minute visit   HYPERTENSION: Not well controlled If his blood pressure is not better with increasing Invokana will start him back on Aldactone since he also has some low potassium which needs to be reassessed  LIPIDS: Follow-up labs will be done today.  May consider switching to 40 mg Crestor if LDL still high   Patient Instructions  Suspend for heavy work  Bolus for all carbs  Check blood sugars on waking up and before each meal  Also check blood sugars about 2 hours after meals and do this after different meals by rotation  Recommended blood sugar levels on waking up are 90-130 and about 2 hours after meal is 130-160  Please  bring your blood sugar monitor to each visit, thank you  Invokana 2 pills then next Rx will    Counseling time on subjects discussed in assessment and plan sections is over 50% of today's 25 minute visit  Reather Littler 09/23/2018, 11:17 AM   Note: This office note was prepared with Dragon voice recognition system technology. Any transcriptional errors that result from this process are unintentional.

## 2018-09-23 LAB — BASIC METABOLIC PANEL
BUN: 15 mg/dL (ref 6–23)
CO2: 32 mEq/L (ref 19–32)
CREATININE: 1.09 mg/dL (ref 0.40–1.50)
Calcium: 9.5 mg/dL (ref 8.4–10.5)
Chloride: 103 mEq/L (ref 96–112)
GFR: 71.92 mL/min (ref >60.00)
Glucose, Bld: 164 mg/dL — ABNORMAL HIGH (ref 70–99)
Potassium: 3.2 mEq/L — ABNORMAL LOW (ref 3.5–5.1)
Sodium: 143 mEq/L (ref 135–145)

## 2018-09-23 LAB — POCT GLYCOSYLATED HEMOGLOBIN (HGB A1C): Hemoglobin A1C: 7.3 % — AB (ref 4.0–5.6)

## 2018-09-23 LAB — LIPID PANEL
CHOL/HDL RATIO: 4
Cholesterol: 172 mg/dL (ref 0–200)
HDL: 44.4 mg/dL (ref >39.00)
Triglycerides: 496 mg/dL — ABNORMAL HIGH (ref 0.0–149.0)

## 2018-09-23 LAB — LDL CHOLESTEROL, DIRECT: Direct LDL: 48 mg/dL

## 2018-09-26 ENCOUNTER — Other Ambulatory Visit: Payer: Self-pay

## 2018-09-26 ENCOUNTER — Other Ambulatory Visit: Payer: Self-pay | Admitting: Endocrinology

## 2018-09-26 LAB — MICROALBUMIN / CREATININE URINE RATIO
Creatinine,U: 92.5 mg/dL
Microalb Creat Ratio: 0.8 mg/g (ref 0.0–30.0)
Microalb, Ur: <0.7 mg/dL (ref 0.0–1.9)

## 2018-09-26 MED ORDER — INSULIN LISPRO 100 UNIT/ML ~~LOC~~ SOLN: SUBCUTANEOUS | 3 refills | Status: DC

## 2018-09-26 MED ORDER — FENOFIBRATE 145 MG PO TABS: 145.0000 mg | ORAL_TABLET | Freq: Every day | ORAL | 2 refills | Status: DC

## 2018-09-26 MED ORDER — SPIRONOLACTONE 50 MG PO TABS: 50.0000 mg | ORAL_TABLET | Freq: Every day | ORAL | 2 refills | Status: DC

## 2018-10-04 ENCOUNTER — Encounter: Payer: Self-pay | Admitting: Internal Medicine

## 2018-10-04 ENCOUNTER — Ambulatory Visit (INDEPENDENT_AMBULATORY_CARE_PROVIDER_SITE_OTHER): Payer: Managed Care, Other (non HMO) | Admitting: Internal Medicine

## 2018-10-04 DIAGNOSIS — I10 Essential (primary) hypertension: Secondary | ICD-10-CM

## 2018-10-04 DIAGNOSIS — Z794 Long term (current) use of insulin: Secondary | ICD-10-CM

## 2018-10-04 DIAGNOSIS — E876 Hypokalemia: Secondary | ICD-10-CM | POA: Diagnosis not present

## 2018-10-04 DIAGNOSIS — E119 Type 2 diabetes mellitus without complications: Secondary | ICD-10-CM | POA: Diagnosis not present

## 2018-10-04 DIAGNOSIS — E785 Hyperlipidemia, unspecified: Secondary | ICD-10-CM

## 2018-10-04 MED ORDER — ATORVASTATIN CALCIUM 40 MG PO TABS: 40.0000 mg | ORAL_TABLET | Freq: Every day | ORAL | 3 refills | Status: DC

## 2018-10-04 MED ORDER — POTASSIUM CHLORIDE ER 10 MEQ PO TBCR: 10.0000 meq | EXTENDED_RELEASE_TABLET | Freq: Every day | ORAL | 3 refills | Status: DC

## 2018-10-04 MED ORDER — EZETIMIBE 10 MG PO TABS: 10.0000 mg | ORAL_TABLET | Freq: Every day | ORAL | 3 refills | Status: DC

## 2018-10-04 MED ORDER — CARVEDILOL 3.125 MG PO TABS: 3.1250 mg | ORAL_TABLET | Freq: Two times a day (BID) | ORAL | 3 refills | Status: DC

## 2018-10-04 NOTE — Progress Notes (Signed)
Telephone Note  I connected with Craig Johnson on 10/04/18 at  3:45 PM EDT by telephone note and verified that I am speaking with the correct person using two identifiers.  Location patient: work [employer]  Persons participating in the virtual visit: patient, provider  I discussed the limitations of evaluation and management by telemedicine and the availability of in person appointments. The patient expressed understanding and agreed to proceed.   HPI: 1. DM 2 A1C 7.3 on invokana will check dose with Dr. Dwyane Dee, on metformin 1500 mg daily and insulin via Omnipod pump he goes through 50 units in 3 days  2. Reviewed labs 09/22/2018 K 3.2 with chronically low K, Tgs 496 though pt states he is taking Tricor 145 mg qd 3. HLD see #2  4. HTN on olmesartan-norvasc-hctz 40-10/12.5 mg qd and spironolactone 50 mg qd last BP 138/90 with Endocrine Dr. Dwyane Dee  5. Right foot pain resolved after seeing the foot doctor 04/2018     ROS: See pertinent positives and negatives per HPI.  Past Medical History:  Diagnosis Date  . Anxiety   . Depression   . Diabetes mellitus without complication (HCC)    Type 2  . DKA (diabetic ketoacidoses) ([facility])   . Heart murmur   . Hypertension   . Sleep apnea     No past surgical history on file.  Family History  Problem Relation Age of Onset  . Diabetes Father   . Hypertension Father   . Diabetes Brother   . Hypertension Mother   . Hypertension Maternal Grandmother     SOCIAL HX: works [employer]    Current Outpatient Medications:  .  atorvastatin (LIPITOR) 40 MG tablet, Take 1 tablet (40 mg total) by mouth daily at 6 PM., Disp: 90 tablet, Rfl: 3 .  carvedilol (COREG) 3.125 MG tablet, Take 1 tablet (3.125 mg total) by mouth 2 (two) times daily with a meal., Disp: 180 tablet, Rfl: 3 .  Continuous Blood Gluc Receiver (FREESTYLE LIBRE 14 DAY READER) DEVI, 1 each by Does not apply route as directed., Disp: 1 Device, Rfl: 0 .  Continuous Blood Gluc  Sensor (FREESTYLE LIBRE 14 DAY SENSOR) MISC, USE EVERY 14 DAYS AS DIRECTED, Disp: 2 each, Rfl: 5 .  ezetimibe (ZETIA) 10 MG tablet, Take 1 tablet (10 mg total) by mouth daily., Disp: 90 tablet, Rfl: 3 .  Insulin Disposable Pump (OMNIPOD DASH 5 PACK) MISC, CHANGE Q 72 H UTD, Disp: , Rfl: 0 .  insulin lispro (HUMALOG) 100 UNIT/ML injection, Use max of 80 units per day via omnipod insulin pump., Disp: 30 mL, Rfl: 3 .  INVOKANA 100 MG TABS tablet, TAKE 1 TABLET DAILY BEFORE BREAKFAST, Disp: 30 tablet, Rfl: 3 .  Lancets (FREESTYLE) lancets, Use as instructed to check blood sugar 6 times per day dx code E11.65, Disp: 200 each, Rfl: 3 .  metFORMIN (GLUCOPHAGE-XR) 750 MG 24 hr tablet, TAKE 2 TABLETS (1,500 MG TOTAL) BY MOUTH DAILY WITH BREAKFAST., Disp: 60 tablet, Rfl: 5 .  Olmesartan-amLODIPine-HCTZ 40-10-12.5 MG TABS, Take 1 tablet by mouth daily., Disp: 90 tablet, Rfl: 3 .  potassium chloride (K-DUR) 10 MEQ tablet, Take 1 tablet (10 mEq total) by mouth daily., Disp: 90 tablet, Rfl: 3 .  spironolactone (ALDACTONE) 50 MG tablet, Take 1 tablet (50 mg total) by mouth daily., Disp: 30 tablet, Rfl: 2  EXAM: telephone  VITALS per patient if applicable:  GENERAL: alert, oriented, appears well and in no acute distress  PSYCH/NEURO: pleasant and cooperative,  no obvious depression or anxiety, speech and thought processing grossly intact  ASSESSMENT AND PLAN:  Discussed the following assessment and plan:  1. Hyperlipidemia, unspecified hyperlipidemia type - Plan: atorvastatin (LIPITOR) 40 MG tablet, ezetimibe (ZETIA) 10 MG tablet D/c Tricor 145 mg qd daily doesn't seem to be helping   2. Essential hypertension - Plan: carvedilol (COREG) 3.125 MG tablet (add this back lower dose he was not taking 25 mg bid) to spironolactone 50 mg qd, olmesartan-norvasc-hctz 40-10/12.5 mg qd   3. Hypokalemia - Plan: potassium chloride (K-DUR) 10 MEQ tablet 1 pill daily  -mailed high K list   4. Type 2 diabetes mellitus  without complication, with long-term current use of insulin (HCC)-A1C 7.3 09/23/2018   -insulin Omniopod pump  -will confirm dose of invokana with endocrine 100 mg or or 200 or 300 mg daily per pt he states he was told to take 2 pills and currently on 100 mg qd -if he supposed to take 200 mg Invokana Rx will need to reflect this -continue metformin  5.  Flu shot utd Consider Tdap and pna 23 vx in future  Consider MMR Hep B immune Declines STD check   PSA 04/11/18 0.7  Colonoscopy age 75 y.o    I discussed the assessment and treatment plan with the patient. The patient was provided an opportunity to ask questions and all were answered. The patient agreed with the plan and demonstrated an understanding of the instructions.   The patient was advised to call back or seek an in-person evaluation if the symptoms worsen or if the condition fails to improve as anticipated.  I provided 15 minutes of non-face-to-face time during this encounter.   Nino Glow McLean-Scocuzza, MD

## 2018-10-04 NOTE — Patient Instructions (Signed)
Hypertension Hypertension, commonly called high blood pressure, is when the force of blood pumping through the arteries is too strong. The arteries are the blood vessels that carry blood from the heart throughout the body. Hypertension forces the heart to work harder to pump blood and may cause arteries to become narrow or stiff. Having untreated or uncontrolled hypertension can cause heart attacks, strokes, kidney disease, and other problems. A blood pressure reading consists of a higher number over a lower number. Ideally, your blood pressure should be below 120/80. The first ("top") number is called the systolic pressure. It is a measure of the pressure in your arteries as your heart beats. The second ("bottom") number is called the diastolic pressure. It is a measure of the pressure in your arteries as the heart relaxes. What are the causes? The cause of this condition is not known. What increases the risk? Some risk factors for high blood pressure are under your control. Others are not. Factors you can change  Smoking.  Having type 2 diabetes mellitus, high cholesterol, or both.  Not getting enough exercise or physical activity.  Being overweight.  Having too much fat, sugar, calories, or salt (sodium) in your diet.  Drinking too much alcohol. Factors that are difficult or impossible to change  Having chronic kidney disease.  Having a family history of high blood pressure.  Age. Risk increases with age.  Race. You may be at higher risk if you are African-American.  Gender. Men are at higher risk than women before age 45. After age 65, women are at higher risk than men.  Having obstructive sleep apnea.  Stress. What are the signs or symptoms? Extremely high blood pressure (hypertensive crisis) may cause:  Headache.  Anxiety.  Shortness of breath.  Nosebleed.  Nausea and vomiting.  Severe chest pain.  Jerky movements you cannot control (seizures). How is this  diagnosed? This condition is diagnosed by measuring your blood pressure while you are seated, with your arm resting on a surface. The cuff of the blood pressure monitor will be placed directly against the skin of your upper arm at the level of your heart. It should be measured at least twice using the same arm. Certain conditions can cause a difference in blood pressure between your right and left arms. Certain factors can cause blood pressure readings to be lower or higher than normal (elevated) for a short period of time:  When your blood pressure is higher when you are in a health care provider's office than when you are at home, this is called white coat hypertension. Most people with this condition do not need medicines.  When your blood pressure is higher at home than when you are in a health care provider's office, this is called masked hypertension. Most people with this condition may need medicines to control blood pressure. If you have a high blood pressure reading during one visit or you have normal blood pressure with other risk factors:  You may be asked to return on a different day to have your blood pressure checked again.  You may be asked to monitor your blood pressure at home for 1 week or longer. If you are diagnosed with hypertension, you may have other blood or imaging tests to help your health care provider understand your overall risk for other conditions. How is this treated? This condition is treated by making healthy lifestyle changes, such as eating healthy foods, exercising more, and reducing your alcohol intake. Your health care provider   may prescribe medicine if lifestyle changes are not enough to get your blood pressure under control, and if:  Your systolic blood pressure is above 130.  Your diastolic blood pressure is above 80. Your personal target blood pressure may vary depending on your medical conditions, your age, and other factors. Follow these instructions  at home: Eating and drinking   Eat a diet that is high in fiber and potassium, and low in sodium, added sugar, and fat. An example eating plan is called the DASH (Dietary Approaches to Stop Hypertension) diet. To eat this way: ? Eat plenty of fresh fruits and vegetables. Try to fill half of your plate at each meal with fruits and vegetables. ? Eat whole grains, such as whole wheat pasta, brown rice, or whole grain bread. Fill about one quarter of your plate with whole grains. ? Eat or drink low-fat dairy products, such as skim milk or low-fat yogurt. ? Avoid fatty cuts of meat, processed or cured meats, and poultry with skin. Fill about one quarter of your plate with lean proteins, such as fish, chicken without skin, beans, eggs, and tofu. ? Avoid premade and processed foods. These tend to be higher in sodium, added sugar, and fat.  Reduce your daily sodium intake. Most people with hypertension should eat less than 1,500 mg of sodium a day.  Limit alcohol intake to no more than 1 drink a day for nonpregnant women and 2 drinks a day for men. One drink equals 12 oz of beer, 5 oz of wine, or 1 oz of hard liquor. Lifestyle   Work with your health care provider to maintain a healthy body weight or to lose weight. Ask what an ideal weight is for you.  Get at least 30 minutes of exercise that causes your heart to beat faster (aerobic exercise) most days of the week. Activities may include walking, swimming, or biking.  Include exercise to strengthen your muscles (resistance exercise), such as pilates or lifting weights, as part of your weekly exercise routine. Try to do these types of exercises for 30 minutes at least 3 days a week.  Do not use any products that contain nicotine or tobacco, such as cigarettes and e-cigarettes. If you need help quitting, ask your health care provider.  Monitor your blood pressure at home as told by your health care provider.  Keep all follow-up visits as told by  your health care provider. This is important. Medicines  Take over-the-counter and prescription medicines only as told by your health care provider. Follow directions carefully. Blood pressure medicines must be taken as prescribed.  Do not skip doses of blood pressure medicine. Doing this puts you at risk for problems and can make the medicine less effective.  Ask your health care provider about side effects or reactions to medicines that you should watch for. Contact a health care provider if:  You think you are having a reaction to a medicine you are taking.  You have headaches that keep coming back (recurring).  You feel dizzy.  You have swelling in your ankles.  You have trouble with your vision. Get help right away if:  You develop a severe headache or confusion.  You have unusual weakness or numbness.  You feel faint.  You have severe pain in your chest or abdomen.  You vomit repeatedly.  You have trouble breathing. Summary  Hypertension is when the force of blood pumping through your arteries is too strong. If this condition is not controlled, it   may put you at risk for serious complications.  Your personal target blood pressure may vary depending on your medical conditions, your age, and other factors. For most people, a normal blood pressure is less than 120/80.  Hypertension is treated with lifestyle changes, medicines, or a combination of both. Lifestyle changes include weight loss, eating a healthy, low-sodium diet, exercising more, and limiting alcohol. This information is not intended to replace advice given to you by your health care provider. Make sure you discuss any questions you have with your health care provider. Document Released: 06/15/2005 Document Revised: 05/13/2016 Document Reviewed: 05/13/2016 Elsevier Interactive Patient Education  2019 [employer] Inc.  Hypokalemia Hypokalemia means that the amount of potassium in the blood is lower than  normal.Potassium is a chemical that helps regulate the amount of fluid in the body (electrolyte). It also stimulates muscle tightening (contraction) and helps nerves work properly.Normally, most of the body's potassium is inside of cells, and only a very small amount is in the blood. Because the amount in the blood is so small, minor changes to potassium levels in the blood can be life-threatening. What are the causes? This condition may be caused by:  Antibiotic medicine.  Diarrhea or vomiting. Taking too much of a medicine that helps you have a bowel movement (laxative) can cause diarrhea and lead to hypokalemia.  Chronic kidney disease (CKD).  Medicines that help the body get rid of excess fluid (diuretics).  Eating disorders, such as bulimia.  Low magnesium levels in the body.  Sweating a lot. What are the signs or symptoms? Symptoms of this condition include:  Weakness.  Constipation.  Fatigue.  Muscle cramps.  Mental confusion.  Skipped heartbeats or irregular heartbeat (palpitations).  Tingling or numbness. How is this diagnosed? This condition is diagnosed with a blood test. How is this treated? Hypokalemia can be treated by taking potassium supplements by mouth or adjusting the medicines that you take. Treatment may also include eating more foods that contain a lot of potassium. If your potassium level is very low, you may need to get potassium through an IV tube in one of your veins and be monitored in the hospital. Follow these instructions at home:   Take over-the-counter and prescription medicines only as told by your health care provider. This includes vitamins and supplements.  Eat a healthy diet. A healthy diet includes fresh fruits and vegetables, whole grains, healthy fats, and lean proteins.  If instructed, eat more foods that contain a lot of potassium, such as: ? Nuts, such as peanuts and pistachios. ? Seeds, such as sunflower seeds and pumpkin  seeds. ? Peas, lentils, and lima beans. ? Whole grain and bran cereals and breads. ? Fresh fruits and vegetables, such as apricots, avocado, bananas, cantaloupe, kiwi, oranges, tomatoes, asparagus, and potatoes. ? Orange juice. ? Tomato juice. ? Red meats. ? Yogurt.  Keep all follow-up visits as told by your health care provider. This is important. Contact a health care provider if:  You have weakness that gets worse.  You feel your heart pounding or racing.  You vomit.  You have diarrhea.  You have diabetes (diabetes mellitus) and you have trouble keeping your blood sugar (glucose) in your target range. Get help right away if:  You have chest pain.  You have shortness of breath.  You have vomiting or diarrhea that lasts for more than 2 days.  You faint. This information is not intended to replace advice given to you by your health care provider.  Make sure you discuss any questions you have with your health care provider. Document Released: 06/15/2005 Document Revised: 02/01/2016 Document Reviewed: 02/01/2016 Elsevier Interactive Patient Education  2019 Else[employer] Inc.  Cholesterol Cholesterol is a white, waxy, fat-like substance that is needed by the human body in small amounts. The liver makes all the cholesterol we need. Cholesterol is carried from the liver by the blood through the blood vessels. Deposits of cholesterol (plaques) may build up on blood vessel (artery) walls. Plaques make the arteries narrower and stiffer. Cholesterol plaques increase the risk for heart attack and stroke. You cannot feel your cholesterol level even if it is very high. The only way to know that it is high is to have a blood test. Once you know your cholesterol levels, you should keep a record of the test results. Work with your health care provider to keep your levels in the desired range. What do the results mean?  Total cholesterol is a rough measure of all the cholesterol in your blood.   LDL (low-density lipoprotein) is the "bad" cholesterol. This is the type that causes plaque to build up on the artery walls. You want this level to be low.  HDL (high-density lipoprotein) is the "good" cholesterol because it cleans the arteries and carries the LDL away. You want this level to be high.  Triglycerides are fat that the body can either burn for energy or store. High levels are closely linked to heart disease. What are the desired levels of cholesterol?  Total cholesterol below 200.  LDL below 100 for people who are at risk, below 70 for people at very high risk.  HDL above 40 is good. A level of 60 or higher is considered to be protective against heart disease.  Triglycerides below 150. How can I lower my cholesterol? Diet Follow your diet program as told by your health care provider.  Choose fish or white meat chicken and [place], roasted or baked. Limit fatty cuts of red meat, fried foods, and processed meats, such as sausage and lunch meats.  Eat lots of fresh fruits and vegetables.  Choose whole grains, beans, pasta, potatoes, and cereals.  Choose olive oil, corn oil, or canola oil, and use only small amounts.  Avoid butter, mayonnaise, shortening, or palm kernel oils.  Avoid foods with trans fats.  Drink skim or nonfat milk and eat low-fat or nonfat yogurt and cheeses. Avoid whole milk, cream, ice cream, egg yolks, and full-fat cheeses.  Healthier desserts include angel food cake, ginger snaps, animal crackers, hard candy, popsicles, and low-fat or nonfat frozen yogurt. Avoid pastries, cakes, pies, and cookies.  Exercise  Follow your exercise program as told by your health care provider. A regular program: ? Helps to decrease LDL and raise HDL. ? Helps with weight control.  Do things that increase your activity level, such as gardening, walking, and taking the stairs.  Ask your health care provider about ways that you can be more active in your daily life.  Medicine  Take over-the-counter and prescription medicines only as told by your health care provider. ? Medicine may be prescribed by your health care provider to help lower cholesterol and decrease the risk for heart disease. This is usually done if diet and exercise have failed to bring down cholesterol levels. ? If you have several risk factors, you may need medicine even if your levels are normal. This information is not intended to replace advice given to you by your health care provider. Make sure  you discuss any questions you have with your health care provider. Document Released: 03/10/2001 Document Revised: 01/11/2016 Document Reviewed: 12/14/2015 Elsevier Interactive Patient Education  [employer].

## 2018-10-05 NOTE — Progress Notes (Signed)
Patient not in [facility]. 

## 2018-10-06 ENCOUNTER — Telehealth: Payer: Self-pay | Admitting: Internal Medicine

## 2018-10-06 NOTE — Telephone Encounter (Signed)
sch appt 01/2019 or 02/2019   Thanks tMS

## 2018-10-11 ENCOUNTER — Other Ambulatory Visit: Payer: Self-pay | Admitting: Endocrinology

## 2018-10-11 MED ORDER — CANAGLIFLOZIN 300 MG PO TABS: 300.0000 mg | ORAL_TABLET | Freq: Every day | ORAL | 1 refills | Status: DC

## 2018-10-11 NOTE — Progress Notes (Signed)
Patient has been informed.

## 2018-10-11 NOTE — Progress Notes (Signed)
Fenofibrates are much more effective in lowering triglycerides.  His direct LDL is only 48 so it may be best to not use Zetia and even reduce Lipitor dosage while continuing fenofibrate.  This will reduce any interaction. I will send 300 mg Invokana.  He is usually difficult to get hold of and relatively noncompliant with follow-up

## 2018-10-11 NOTE — Progress Notes (Signed)
Inform pt  He is going to finish off his 100 mg Invokana by taking 2 tablets daily=200 mg and then next prescription will be 300 mg daily for his diabetes   Thanks TMS

## 2018-10-12 ENCOUNTER — Encounter: Payer: Self-pay | Admitting: Internal Medicine

## 2018-10-12 ENCOUNTER — Telehealth: Payer: Self-pay | Admitting: Internal Medicine

## 2018-10-12 NOTE — Progress Notes (Signed)
Called pt mobile left message   Dr. Kumar wants him to take invokana 200 mg 2 pills of 100 mg for now and then when runs out will fill 300 mg Invokana Dr. Kumar   I need to know if he was consistently taking fenofibrate Tricor for cholesterol and Lipitor 40 mg daily ?  Advised pt to call back and ask for [facility]  Thanks  TMS 

## 2018-10-12 NOTE — Telephone Encounter (Signed)
Called pt mobile left message   Dr. Lucianne Muss wants him to take invokana 200 mg 2 pills of 100 mg for now and then when runs out will fill 300 mg Invokana Dr. Lucianne Muss   I need to know if he was consistently taking fenofibrate Tricor for cholesterol and Lipitor 40 mg daily ?  Advised pt to call back and ask for [employer]  [employer]

## 2018-11-04 ENCOUNTER — Other Ambulatory Visit: Payer: Self-pay

## 2018-11-04 MED ORDER — OMNIPOD DASH PDM (GEN 4) KIT: 1.0000 | PACK | Freq: Every day | 0 refills | Status: DC

## 2018-11-05 ENCOUNTER — Other Ambulatory Visit: Payer: Self-pay | Admitting: Endocrinology

## 2018-11-10 ENCOUNTER — Other Ambulatory Visit: Payer: Self-pay | Admitting: Endocrinology

## 2018-11-10 ENCOUNTER — Other Ambulatory Visit: Payer: Self-pay | Admitting: Internal Medicine

## 2018-11-10 ENCOUNTER — Encounter: Payer: Self-pay | Admitting: Internal Medicine

## 2018-11-10 DIAGNOSIS — E785 Hyperlipidemia, unspecified: Secondary | ICD-10-CM

## 2018-11-10 MED ORDER — ATORVASTATIN CALCIUM 20 MG PO TABS: 20.0000 mg | ORAL_TABLET | Freq: Every day | ORAL | 3 refills | Status: DC

## 2018-11-14 ENCOUNTER — Other Ambulatory Visit: Payer: Self-pay

## 2018-11-14 DIAGNOSIS — E1165 Type 2 diabetes mellitus with hyperglycemia: Secondary | ICD-10-CM

## 2018-11-14 MED ORDER — OMNIPOD DASH PDM (GEN 4) KIT: 1.0000 | PACK | Freq: Every day | 0 refills | Status: DC

## 2018-11-15 ENCOUNTER — Other Ambulatory Visit: Payer: Self-pay | Admitting: Endocrinology

## 2018-11-30 ENCOUNTER — Other Ambulatory Visit: Payer: Self-pay | Admitting: Endocrinology

## 2018-11-30 MED ORDER — EMPAGLIFLOZIN 25 MG PO TABS: 25.0000 mg | ORAL_TABLET | Freq: Every day | ORAL | 1 refills | Status: DC

## 2018-12-12 ENCOUNTER — Telehealth: Payer: Self-pay | Admitting: Endocrinology

## 2018-12-12 ENCOUNTER — Other Ambulatory Visit: Payer: Self-pay

## 2018-12-12 MED ORDER — OMNIPOD DASH PODS (GEN 4) MISC: 1.0000 | 2 refills | Status: DC

## 2018-12-12 NOTE — Telephone Encounter (Signed)
Roderic Palau from [facility] ph# [phone] called re: on May 8th they received a RX for [employer] but [employer] (company that makes it already already sent patient the starter kit). So at this point the above Pharmacy needs RX for repllacement pods. Fax# [phone]-MHWKGSU [phone]- Address: [address] in [place].

## 2018-12-12 NOTE — Telephone Encounter (Signed)
Rx sent for correct pods to pharmacy listed below.

## 2018-12-13 ENCOUNTER — Other Ambulatory Visit: Payer: Self-pay

## 2018-12-13 MED ORDER — DEXCOM G6 RECEIVER DEVI: 1.0000 | 0 refills | Status: DC

## 2018-12-13 MED ORDER — DEXCOM G6 TRANSMITTER MISC: 1.0000 | 4 refills | Status: DC

## 2018-12-13 MED ORDER — DEXCOM G6 SENSOR MISC: 1.0000 | 11 refills | Status: DC

## 2018-12-18 ENCOUNTER — Other Ambulatory Visit: Payer: Self-pay | Admitting: Endocrinology

## 2018-12-25 NOTE — Progress Notes (Deleted)
Patient ID: Craig Johnson, male   DOB: [birth year], 48 y.o.   MRN: [id number]           Reason for Appointment: Follow-up for Type 2 Diabetes  Referring physician: None  History of Present Illness:          Date of diagnosis of type 2 diabetes mellitus: 2013       Background history:  He probably had significantly high blood sugars at time of diagnosis but details are not available. He thinks he was tried on various oral medications initially and these did not improve his blood sugar (Metformin, Januvia, Farxiga) Probably was started on insulin about 3 months after diagnosis Apparently was on as much as 60 units of Lantus daily.   He was started on an insulin pump with the OMNIPOD on 05/20/15  Recent history:   Oral hypoglycemic drugs the patient is taking are: Invokana 100 mg daily, metformin ER 1500 mg daily       Omnipod PUMP settings :   Basal rate = 1.7 until 6 AM , 6 AM - 11 AM = 2.1 , 11 AM- 2 PM = 1 1.9, 2 PM-7 PM =  1.8 and 7 PM = 2.2  BOLUSES: carbohydrate ratio of 1:7,  Except 1:8 at lunch and correction factor 1:35 with target 120  His A1c is now higher at 7.3, previously 6.5   Current blood sugar patterns, management and problems identified since last visit  He continues to be noncompliant with all measures of self-care with diabetes  He has not been seen in follow-up in almost a year  Despite reminders he does not do any boluses on his pump at mealtime  Also his sensor download shows that he has been checking his blood sugars very regularly and mostly sporadically only in the mornings with only 20% of the time his data being available  He is out of his sensors currently  Although he is using his insulin pump he is only utilizing the basal rate  However has continued his Invokana and metformin  Currently appears to have variable fasting readings ranging from 113 up to about 300  Also some of his data does indicate postprandial spikes including at  breakfast and lunch  He also thinks that he will tend to get low blood sugars in the mornings when he is more active lifting boxes at work but otherwise is not as active.  He does not think about suspending or reducing his basal rate for this activity   Side effects from medications have been: None  Compliance with the medical regimen: Improved   Glucose monitoring:  As above          Glucometer:  Freestyle libre    Blood Glucose readings  CGM use % of time  20  2-week average/SD  160  Time in range      76 %  % Time Above 180  24  % Time above 250   % Time Below 70  0   Blood sugar readings as above FASTING average appears to be about 144   Self-care:    Previous diabetes education: with CDE with pump start Diet: He has variable intake and variable times               Exercise:  Mostly active in the mornings when he is working  Weight history:   Wt Readings from Last 3 Encounters:  09/22/18 184 lb 12.8 oz (83.8 kg)  05/09/18 183 lb  12.8 oz (83.4 kg)  04/01/18 188 lb 14.4 oz (85.7 kg)    Glycemic control:   Lab Results  Component Value Date   HGBA1C 7.3 (A) 09/23/2018   HGBA1C 6.5 (H) 04/11/2018   HGBA1C 6.7 11/08/2017   Lab Results  Component Value Date   MICROALBUR <0.7 09/22/2018   [place] 107 (H) 04/11/2018   CREATININE 1.09 09/22/2018    Other active problems addressed: See review of systems   Allergies as of 12/26/2018      Reactions   Bee Venom Swelling      Medication List       Accurate as of December 25, 2018  9:17 PM. If you have any questions, ask your nurse or doctor.        atorvastatin 20 MG tablet Commonly known as: LIPITOR Take 1 tablet (20 mg total) by mouth daily at 6 PM. Informed pt to reduce dose cut 40 mg in 1/2=20 mg new Rx will be 20 mg do not cut in 1/2   carvedilol 3.125 MG tablet Commonly known as: COREG Take 1 tablet (3.125 mg total) by mouth 2 (two) times daily with a meal.   Dexcom G6 Receiver Devi 1 each by Does not  apply route See admin instructions. Use reader to monitor blood glucose continuously.   Dexcom G6 Sensor Misc 1 each by Does not apply route See admin instructions. Apply 1 sensor to body once every 10 days to monitor blood glucose.   Dexcom G6 Transmitter Misc 1 each by Does not apply route See admin instructions. Use 1 transmitter once every 90 days in conjunction with Dexcom G6 sensor and reader.   empagliflozin 25 MG Tabs tablet Commonly known as: Jardiance Take 25 mg by mouth daily.   ezetimibe 10 MG tablet Commonly known as: Zetia Take 1 tablet (10 mg total) by mouth daily.   insulin lispro 100 UNIT/ML injection Commonly known as: HUMALOG Use max of 80 units per day via omnipod insulin pump.   metFORMIN 750 MG 24 hr tablet Commonly known as: GLUCOPHAGE-XR TAKE 2 TABLETS (1,500 MG TOTAL) BY MOUTH DAILY WITH BREAKFAST.   Olmesartan-amLODIPine-HCTZ 40-10-12.5 MG Tabs Take 1 tablet by mouth daily.   OmniPod Dash System Kit 1 each by Does not apply route daily. Use Omnipod PDM to administer insulin daily.   OmniPod Dash 5 Pack Pods Misc Inject 1 each into the skin every 3 (three) days.   potassium chloride 10 MEQ tablet Commonly known as: K-DUR Take 1 tablet (10 mEq total) by mouth daily.   spironolactone 50 MG tablet Commonly known as: ALDACTONE TAKE 1 TABLET BY MOUTH EVERY DAY       Allergies:  Allergies  Allergen Reactions  . Bee Venom Swelling    Past Medical History:  Diagnosis Date  . Anxiety   . Depression   . Diabetes mellitus without complication (HCC)    Type 2  . DKA (diabetic ketoacidoses) ([place])   . Heart murmur   . Hypertension   . Sleep apnea     No past surgical history on file.  Family History  Problem Relation Age of Onset  . Diabetes Father   . Hypertension Father   . Diabetes Brother   . Hypertension Mother   . Hypertension Maternal Grandmother     Social History:  reports that he has never smoked. He has never used  smokeless tobacco. He reports current alcohol use. He reports that he does not use drugs.  ROS  Hypertension:   He has had fairly significant hypertension which is difficult to control  Currently taking triple therapy with Benicar/amlodipine/HCTZ as well as Coreg 25 mg; Aldactone 50 mg was started in 12/18 but is currently not taking it  He does check blood pressure occasionally at home  He has had had  hypokalemia with HCTZ, last assessed by his PCP Also trial of clonidine patch was unsuccessful because of local itching.  His aldosterone/renin activity level was high and the salt loading test showed the 24-hour urine aldosterone to be 20.3 However his CT scan did not show any adrenal abnormality   BP Readings from Last 3 Encounters:  09/22/18 138/90  05/09/18 (!) 146/90  05/03/18 (!) 164/97   Lab Results  Component Value Date   CREATININE 1.09 09/22/2018   BUN 15 09/22/2018   NA 143 09/22/2018   K 3.2 (L) 09/22/2018   CL 103 09/22/2018   CO2 32 09/22/2018     Lipid history: Has been on 40 mg Lipitor as a statin drug He was previously taking 145 mg of fenofibrate from his PCP  Lab Results  Component Value Date   CHOL 172 09/22/2018   HDL 44.40 09/22/2018   LDLCALC 107 (H) 04/11/2018   LDLDIRECT 48.0 09/22/2018   TRIG (H) 09/22/2018    496.0 Triglyceride is over 400; calculations on Lipids are invalid.   CHOLHDL 4 09/22/2018    Most recent eye exam was 2018, he thinks he has been followed for glaucoma   Physical Examination:  There were no vitals taken for this visit.    ASSESSMENT:  Diabetes type 2, insulin-dependent, treated with Omni pod insulin pump See history of present illness for detailed discussion of current diabetes management, blood sugar patterns and problems identified  Blood sugar control is appearing to be improving with the insulin pump  A1c 7.3 even though previously it had been as low as 6.7  He has not been seen in follow-up  for almost a year As above he is totally unmotivated to take care of his diabetes and his not doing any mealtime boluses, minimal monitoring  May be benefiting from [place] since even without mealtime boluses his A1c is not significantly higher than before Also does not appear to be following instructions for various day-to-day measures including using temporary basal for his active periods at work to prevent hypoglycemia  HYPERTENSION:   Blood pressure was previously better with Aldactone which he is not taking Has been followed by PCP    HYPERLIPIDEMIA: Last lipid panel showed mild increase in LDL, has been seen by PCP  PLAN:    He will use the freestyle Sensor to check blood sugar at least 3-4 times a day including after meals.  Showed him the print out and the fact that his data is not available for most of the day  Discussed treatment of postprandial hyperglycemia with consistent boluses  He will need to enter his carbohydrates and blood sugars every time he is eating a meal  Since he thinks he may be getting excessive carbohydrate coverage with current settings he can use a 1: 15 ratio for his carbohydrate coverage  He will suspend his pump for an hour when he is active in the mornings at work  Needs regular follow-up  Increase Invokana to 300 mg for better efficacy  Counseling time on subjects discussed in assessment and plan sections is over 50% of today's 25 minute visit   HYPERTENSION: Not well controlled If  his blood pressure is not better with increasing Invokana will start him back on Aldactone since he also has some low potassium which needs to be reassessed  LIPIDS: Follow-up labs will be done today.  May consider switching to 40 mg Crestor if LDL still high   There are no Patient Instructions on file for this visit.  Counseling time on subjects discussed in assessment and plan sections is over 50% of today's 25 minute visit  Elayne Snare 12/25/2018, 9:17 PM    Note: This office note was prepared with Dragon voice recognition system technology. Any transcriptional errors that result from this process are unintentional.

## 2018-12-26 ENCOUNTER — Ambulatory Visit: Payer: Managed Care, Other (non HMO) | Admitting: Endocrinology

## 2019-01-18 ENCOUNTER — Ambulatory Visit (INDEPENDENT_AMBULATORY_CARE_PROVIDER_SITE_OTHER): Payer: Managed Care, Other (non HMO) | Admitting: Endocrinology

## 2019-01-18 ENCOUNTER — Other Ambulatory Visit: Payer: Self-pay

## 2019-01-18 ENCOUNTER — Encounter: Payer: Self-pay | Admitting: Endocrinology

## 2019-01-18 VITALS — BP 130/90 | HR 86 | Ht 73.0 in | Wt 185.0 lb

## 2019-01-18 DIAGNOSIS — I10 Essential (primary) hypertension: Secondary | ICD-10-CM

## 2019-01-18 DIAGNOSIS — E782 Mixed hyperlipidemia: Secondary | ICD-10-CM | POA: Diagnosis not present

## 2019-01-18 DIAGNOSIS — Z794 Long term (current) use of insulin: Secondary | ICD-10-CM

## 2019-01-18 DIAGNOSIS — E1165 Type 2 diabetes mellitus with hyperglycemia: Secondary | ICD-10-CM | POA: Diagnosis not present

## 2019-01-18 LAB — POCT GLYCOSYLATED HEMOGLOBIN (HGB A1C): Hemoglobin A1C: 7.1 % — AB (ref 4.0–5.6)

## 2019-01-18 NOTE — Progress Notes (Signed)
Patient ID: Craig Johnson, male   DOB: 1971/03/30, 48 y.o.   MRN: [id number]           Reason for Appointment: Follow-up for Type 2 Diabetes  Referring physician: None  History of Present Illness:          Date of diagnosis of type 2 diabetes mellitus: 2013       Background history:  He probably had significantly high blood sugars at time of diagnosis but details are not available. He thinks he was tried on various oral medications initially and these did not improve his blood sugar (Metformin, Januvia, Farxiga) Probably was started on insulin about 3 months after diagnosis Apparently was on as much as 60 units of Lantus daily.   He was started on an insulin pump with the OMNIPOD on 05/20/15  Recent history:   Oral hypoglycemic drugs the patient is taking are: Invokana 300 mg daily, metformin ER 1500 mg daily       Omnipod PUMP settings :   Basal rate = 1.7 until 6 AM , 6 AM - 11 AM = 2.1 , 11 AM- 2 PM = 1.9, 2 PM-7 PM =  1.8 and 7 PM = 2.2   BOLUSES: carbohydrate ratio of 1:7,  Except 1:8 at lunch and correction factor 1:35 with target 120  His A1c is 7.1, previously 6.5-7.3   Current blood sugar patterns, management and problems identified since last visit  He continues to be noncompliant with follow-up and was seen only because he thought there was something wrong with his pump  However his pump appears to be working properly but he has a basal rate of 7.8 programmed by mistake at 2 PM  His Invokana was increased to 300 mg on his visit in March  Also because of his lack of adequate glucose monitoring and inconsistent use of the freestyle sensors he is now using the Dexcom  He has been able to use the Dexcom adequately on his own and the download is interpreted below  As before he appears to be not doing boluses usually and surprisingly his blood sugars do not go up unless he is eating certain foods like cereal in the morning or drinking regular soft drinks with his  fast food lunches  Has only rare boluses to correct readings over 210 the last 2 weeks   Side effects from medications have been: None  Compliance with the medical regimen: Improved   Glucose monitoring:  Dexcom   CONTINUOUS GLUCOSE MONITORING RECORD INTERPRETATION    Dates of Recording: 7/9 through 7/22  Sensor description: Dexcom G6  Results statistics:   CGM use % of time  89  Average and SD  132+/-39  Time in range     82   %  % Time Above 180  12  % Time above 250   % Time Below target  5.3    Glycemic patterns summary: Blood sugars are within the target range most of the time with inconsistent rise in blood sugar either early morning, afternoon and evening and rarely around midnight Has only inconsistent tendency to hypoglycemia around 4 PM  Hyperglycemic episodes have occurred sporadically after meals with highest reading after breakfast in the last week and after lunch the prior week  Hypoglycemic episodes occurred only on 2 or 3 occasions but mostly between 3 PM and 6 PM  Overnight periods: Blood sugars are relatively stable and steady averaging about 130 with some variability and only out of the  target range once around midnight  Preprandial periods: Blood sugars are generally excellent averaging 130-140 at most meals  Postprandial periods:   After breakfast: Glucose has been higher only on 2 occasions each week but as high as 250 After lunch: Has at least 2 readings that are significantly high after lunch in the last week and once the previous week with maximum about 250  After dinner: Glucose readings are variable and may be relatively higher about half the time, usually not above 180  Previous data:  Blood Glucose readings  CGM use % of time  20  2-week average/SD  160  Time in range      76 %  % Time Above 180  24  % Time above 250   % Time Below 70  0     Self-care:    Previous diabetes education: with CDE with pump start Diet: He has  variable intake and variable times               Exercise:  Mostly active in the mornings when he is working  Weight history:   Wt Readings from Last 3 Encounters:  01/18/19 185 lb (83.9 kg)  09/22/18 184 lb 12.8 oz (83.8 kg)  05/09/18 183 lb 12.8 oz (83.4 kg)    Glycemic control:   Lab Results  Component Value Date   HGBA1C 7.1 (A) 01/18/2019   HGBA1C 7.3 (A) 09/23/2018   HGBA1C 6.5 (H) 04/11/2018   Lab Results  Component Value Date   MICROALBUR <0.7 09/22/2018   LDLCALC 107 (H) 04/11/2018   CREATININE 1.09 09/22/2018    Other active problems addressed: See review of systems   Allergies as of 01/18/2019      Reactions   Bee Venom Swelling      Medication List       Accurate as of January 18, 2019  4:42 PM. If you have any questions, ask your nurse or doctor.        atorvastatin 20 MG tablet Commonly known as: LIPITOR Take 1 tablet (20 mg total) by mouth daily at 6 PM. Informed pt to reduce dose cut 40 mg in 1/2=20 mg new Rx will be 20 mg do not cut in 1/2   carvedilol 3.125 MG tablet Commonly known as: COREG Take 1 tablet (3.125 mg total) by mouth 2 (two) times daily with a meal.   Dexcom G6 Receiver Devi 1 each by Does not apply route See admin instructions. Use reader to monitor blood glucose continuously.   Dexcom G6 Sensor Misc 1 each by Does not apply route See admin instructions. Apply 1 sensor to body once every 10 days to monitor blood glucose.   Dexcom G6 Transmitter Misc 1 each by Does not apply route See admin instructions. Use 1 transmitter once every 90 days in conjunction with Dexcom G6 sensor and reader.   empagliflozin 25 MG Tabs tablet Commonly known as: Jardiance Take 25 mg by mouth daily.   ezetimibe 10 MG tablet Commonly known as: Zetia Take 1 tablet (10 mg total) by mouth daily.   insulin lispro 100 UNIT/ML injection Commonly known as: HUMALOG Use max of 80 units per day via omnipod insulin pump.   metFORMIN 750 MG 24 hr tablet  Commonly known as: GLUCOPHAGE-XR TAKE 2 TABLETS (1,500 MG TOTAL) BY MOUTH DAILY WITH BREAKFAST.   Olmesartan-amLODIPine-HCTZ 40-10-12.5 MG Tabs Take 1 tablet by mouth daily.   OmniPod Dash System Kit 1 each by Does not apply route daily.  Use Omnipod PDM to administer insulin daily.   OmniPod Dash 5 Pack Pods Misc Inject 1 each into the skin every 3 (three) days.   potassium chloride 10 MEQ tablet Commonly known as: K-DUR Take 1 tablet (10 mEq total) by mouth daily.   spironolactone 50 MG tablet Commonly known as: ALDACTONE TAKE 1 TABLET BY MOUTH EVERY DAY       Allergies:  Allergies  Allergen Reactions  . Bee Venom Swelling    Past Medical History:  Diagnosis Date  . Anxiety   . Depression   . Diabetes mellitus without complication (HCC)    Type 2  . DKA (diabetic ketoacidoses) ([place])   . Heart murmur   . Hypertension   . Sleep apnea     History reviewed. No pertinent surgical history.  Family History  Problem Relation Age of Onset  . Diabetes Father   . Hypertension Father   . Diabetes Brother   . Hypertension Mother   . Hypertension Maternal Grandmother     Social History:  reports that he has never smoked. He has never used smokeless tobacco. He reports current alcohol use. He reports that he does not use drugs.     ROS  Hypertension: This is now being managed by his PCP  He has had fairly significant hypertension which is difficult to control  Currently taking triple therapy with Benicar/amlodipine/HCTZ as well as Coreg; Aldactone 50 mg was  restarted on his visit in 08/2018 Not clear why his carvedilol dose is only 3.125 instead of 25  He does check blood pressure occasionally at home, recent readings 125/85-90  He has had had  hypokalemia with HCTZ and is now on Aldactone Previously trial of clonidine patch was unsuccessful because of local itching.  His aldosterone/renin activity level was high and the salt loading test showed the 24-hour  urine aldosterone to be 20.3 However his CT scan did not show any adrenal abnormality   BP Readings from Last 3 Encounters:  01/18/19 130/90  09/22/18 138/90  05/09/18 (!) 146/90   Lab Results  Component Value Date   CREATININE 1.09 09/22/2018   BUN 15 09/22/2018   NA 143 09/22/2018   K 3.2 (L) 09/22/2018   CL 103 09/22/2018   CO2 32 09/22/2018     Lipid history: Has been on 20 mg Lipitor as a statin drug along with Zetia from his PCP Not clear why he is taking Zetia since his LDL was only 48  He was previously taking 145 mg of fenofibrate and his last triglycerides were still high  Lab Results  Component Value Date   CHOL 172 09/22/2018   HDL 44.40 09/22/2018   LDLCALC 107 (H) 04/11/2018   LDLDIRECT 48.0 09/22/2018   TRIG (H) 09/22/2018    496.0 Triglyceride is over 400; calculations on Lipids are invalid.   CHOLHDL 4 09/22/2018    Most recent eye exam was 2018, he thinks he has been followed for glaucoma   Physical Examination:  BP 130/90 (BP Location: Left Arm, Patient Position: Sitting, Cuff Size: Normal)   Pulse 86   Ht 6' 1"  (1.854 m)   Wt 185 lb (83.9 kg)   SpO2 97%   BMI 24.41 kg/m     ASSESSMENT:  Diabetes type 2, insulin-dependent, treated with Omni pod insulin pump See history of present illness for detailed discussion of current diabetes management, blood sugar patterns and problems identified  Blood sugar control is appearing to be improving with the insulin pump  A1c is 7.1, previously 7.3  Surprisingly with taking 300 mg Invokana he is generally able to have good control with mostly basal delivery on his pump only Only when he is going off his diet with certain carbohydrates or drinking regular soft drinks that his blood sugars go up periodically, up to about 250 Currently 12% of his readings are above 180 target and this was discussed Hypoglycemia appears to be mostly in the late afternoon because of having a mistakenly high basal rate  Discussed his day-to-day management, use of the Dexcom sensor to help him manage his diabetes especially around mealtimes  HYPERTENSION:   Blood pressure was previously better He is now still not adequately controlled but is on a very low-dose of carvedilol compared to the previous dose of 25 mg  Has been followed by PCP and blood pressure is still high today    HYPERLIPIDEMIA: Last lipid panel showed high triglycerides when he is not on any treatment for this, previously on fenofibrate, He is going to follow-up with PCP for fasting labs  PLAN:    He will try to improve the diet as discussed and avoid cereals, regular soft drinks and large carbohydrate meals  To bolus if he is eating a large meal or more carbohydrate especially fast food  Basal rate in the afternoon will be 1.8 instead of 7.8  Regular exercise  Recheck renal function with his current dose of 300 mg Invokana   If his PCP is wanting a specialist opinion for his lipid management in endocrinology this can be done  Counseling time on subjects discussed in assessment and plan sections is over 50% of today's 25 minute visit     There are no Patient Instructions on file for this visit.  Counseling time on subjects discussed in assessment and plan sections is over 50% of today's 25 minute visit  Elayne Snare 01/18/2019, 4:42 PM   Note: This office note was prepared with Dragon voice recognition system technology. Any transcriptional errors that result from this process are unintentional.

## 2019-01-19 LAB — COMPREHENSIVE METABOLIC PANEL
ALT: 23 U/L (ref 0–53)
AST: 23 U/L (ref 0–37)
Albumin: 5 g/dL (ref 3.5–5.2)
Alkaline Phosphatase: 44 U/L (ref 39–117)
BUN: 16 mg/dL (ref 6–23)
CO2: 26 mEq/L (ref 19–32)
Calcium: 10.1 mg/dL (ref 8.4–10.5)
Chloride: 107 mEq/L (ref 96–112)
Creatinine, Ser: 1.01 mg/dL (ref 0.40–1.50)
GFR: 78.43 mL/min (ref >60.00)
Glucose, Bld: 73 mg/dL (ref 70–99)
Potassium: 3.8 mEq/L (ref 3.5–5.1)
Sodium: 141 mEq/L (ref 135–145)
Total Bilirubin: 0.4 mg/dL (ref 0.2–1.2)
Total Protein: 7.9 g/dL (ref 6.0–8.3)

## 2019-01-21 ENCOUNTER — Other Ambulatory Visit: Payer: Self-pay | Admitting: Endocrinology

## 2019-01-22 ENCOUNTER — Other Ambulatory Visit: Payer: Self-pay | Admitting: Endocrinology

## 2019-02-01 ENCOUNTER — Other Ambulatory Visit: Payer: Self-pay

## 2019-02-01 ENCOUNTER — Ambulatory Visit (INDEPENDENT_AMBULATORY_CARE_PROVIDER_SITE_OTHER): Payer: Managed Care, Other (non HMO) | Admitting: Internal Medicine

## 2019-02-01 ENCOUNTER — Encounter: Payer: Self-pay | Admitting: Internal Medicine

## 2019-02-01 DIAGNOSIS — R252 Cramp and spasm: Secondary | ICD-10-CM

## 2019-02-01 DIAGNOSIS — E119 Type 2 diabetes mellitus without complications: Secondary | ICD-10-CM

## 2019-02-01 DIAGNOSIS — Z0100 Encounter for examination of eyes and vision without abnormal findings: Secondary | ICD-10-CM

## 2019-02-01 DIAGNOSIS — E1165 Type 2 diabetes mellitus with hyperglycemia: Secondary | ICD-10-CM

## 2019-02-01 DIAGNOSIS — Z0001 Encounter for general adult medical examination with abnormal findings: Secondary | ICD-10-CM

## 2019-02-01 DIAGNOSIS — Z0000 Encounter for general adult medical examination without abnormal findings: Secondary | ICD-10-CM

## 2019-02-01 DIAGNOSIS — I10 Essential (primary) hypertension: Secondary | ICD-10-CM | POA: Diagnosis not present

## 2019-02-01 DIAGNOSIS — E538 Deficiency of other specified B group vitamins: Secondary | ICD-10-CM

## 2019-02-01 DIAGNOSIS — E785 Hyperlipidemia, unspecified: Secondary | ICD-10-CM

## 2019-02-01 DIAGNOSIS — Z1389 Encounter for screening for other disorder: Secondary | ICD-10-CM

## 2019-02-01 DIAGNOSIS — Z794 Long term (current) use of insulin: Secondary | ICD-10-CM

## 2019-02-01 MED ORDER — CARVEDILOL 6.25 MG PO TABS: 6.2500 mg | ORAL_TABLET | Freq: Two times a day (BID) | ORAL | 3 refills | Status: DC

## 2019-02-01 MED ORDER — OLMESARTAN-AMLODIPINE-HCTZ 40-10-12.5 MG PO TABS: 1.0000 | ORAL_TABLET | Freq: Every day | ORAL | 3 refills | Status: DC

## 2019-02-01 MED ORDER — SPIRONOLACTONE 50 MG PO TABS: 50.0000 mg | ORAL_TABLET | Freq: Every day | ORAL | 3 refills | Status: DC

## 2019-02-01 MED ORDER — EZETIMIBE 10 MG PO TABS: 10.0000 mg | ORAL_TABLET | Freq: Every day | ORAL | 3 refills | Status: DC

## 2019-02-01 MED ORDER — ATORVASTATIN CALCIUM 20 MG PO TABS: 20.0000 mg | ORAL_TABLET | Freq: Every day | ORAL | 3 refills | Status: DC

## 2019-02-01 NOTE — Patient Instructions (Signed)
Influenza (Flu) Vaccine (Inactivated or Recombinant): What You Need to Know 1. Why get vaccinated? Influenza vaccine can prevent influenza (flu). Flu is a contagious disease that spreads around the [place] every year, usually between October and May. Anyone can get the flu, but it is more dangerous for some people. Infants and young children, people 49 years of age and older, pregnant women, and people with certain health conditions or a weakened immune system are at greatest risk of flu complications. Pneumonia, bronchitis, sinus infections and ear infections are examples of flu-related complications. If you have a medical condition, such as heart disease, cancer or diabetes, flu can make it worse. Flu can cause fever and chills, sore throat, muscle aches, fatigue, cough, headache, and runny or stuffy nose. Some people may have vomiting and diarrhea, though this is more common in children than adults. Each year thousands of people in the [place] die from flu, and many more are hospitalized. Flu vaccine prevents millions of illnesses and flu-related visits to the doctor each year. 2. Influenza vaccine CDC recommends everyone 57 months of age and older get vaccinated every flu season. Children 6 months through 2 years of age may need 2 doses during a single flu season. Everyone else needs only 1 dose each flu season. It takes about 2 weeks for protection to develop after vaccination. There are many flu viruses, and they are always changing. Each year a new flu vaccine is made to protect against three or four viruses that are likely to cause disease in the upcoming flu season. Even when the vaccine doesn't exactly match these viruses, it may still provide some protection. Influenza vaccine does not cause flu. Influenza vaccine may be given at the same time as other vaccines. 3. Talk with your health care provider Tell your vaccine provider if the person getting the vaccine:  Has had an  allergic reaction after a previous dose of influenza vaccine, or has any severe, life-threatening allergies.  Has ever had Guillain-Barr Syndrome (also called GBS). In some cases, your health care provider may decide to postpone influenza vaccination to a future visit. People with minor illnesses, such as a cold, may be vaccinated. People who are moderately or severely ill should usually wait until they recover before getting influenza vaccine. Your health care provider can give you more information. 4. Risks of a vaccine reaction  Soreness, redness, and swelling where shot is given, fever, muscle aches, and headache can happen after influenza vaccine.  There may be a very small increased risk of Guillain-Barr Syndrome (GBS) after inactivated influenza vaccine (the flu shot). Young children who get the flu shot along with pneumococcal vaccine (PCV13), and/or DTaP vaccine at the same time might be slightly more likely to have a seizure caused by fever. Tell your health care provider if a child who is getting flu vaccine has ever had a seizure. People sometimes faint after medical procedures, including vaccination. Tell your provider if you feel dizzy or have vision changes or ringing in the ears. As with any medicine, there is a very remote chance of a vaccine causing a severe allergic reaction, other serious injury, or death. 5. What if there is a serious problem? An allergic reaction could occur after the vaccinated person leaves the clinic. If you see signs of a severe allergic reaction (hives, swelling of the face and throat, difficulty breathing, a fast heartbeat, dizziness, or weakness), call 9-1-1 and get the person to the nearest hospital. For other signs that  concern you, call your health care provider. Adverse reactions should be reported to the Vaccine Adverse Event Reporting System (VAERS). Your health care provider will usually file this report, or you can do it yourself. Visit the  VAERS website at www.vaers.SamedayNews.es or call (304) 157-2882.VAERS is only for reporting reactions, and VAERS staff do not give medical advice. 6. The National Vaccine Injury Compensation Program The [employer] Vaccine Injury Compensation Program (VICP) is a federal program that was created to compensate people who may have been injured by certain vaccines. Visit the VICP website at GoldCloset.com.ee or call (757) 438-8027 to learn about the program and about filing a claim. There is a time limit to file a claim for compensation. 7. How can I learn more?  Ask your healthcare provider.  Call your local or state health department.  Contact the Centers for Disease Control and Prevention (CDC): ? Call 7653809986 (1-800-CDC-INFO) or ? Visit CDC's https://gibson.com/ Vaccine Information Statement (Interim) Inactivated Influenza Vaccine (02/10/2018) This information is not intended to replace advice given to you by your health care provider. Make sure you discuss any questions you have with your health care provider. Document Released: 04/09/2006 Document Revised: 10/04/2018 Document Reviewed: 02/14/2018 Elsevier Patient Education  [place].  Pneumococcal Conjugate Vaccine suspension for injection What is this medicine? PNEUMOCOCCAL VACCINE (NEU mo KOK al vak SEEN) is a vaccine used to prevent pneumococcus bacterial infections. These bacteria can cause serious infections like pneumonia, meningitis, and blood infections. This vaccine will lower your chance of getting pneumonia. If you do get pneumonia, it can make your symptoms milder and your illness shorter. This vaccine will not treat an infection and will not cause infection. This vaccine is recommended for infants and young children, adults with certain medical conditions, and adults 54 years or older. This medicine may be used for other purposes; ask your health care provider or pharmacist if you have questions. COMMON BRAND  NAME(S): Prevnar, Prevnar 13 What should I tell my health care provider before I take this medicine? They need to know if you have any of these conditions:  bleeding problems  fever  immune system problems  an unusual or allergic reaction to pneumococcal vaccine, diphtheria toxoid, other vaccines, latex, other medicines, foods, dyes, or preservatives  pregnant or trying to get pregnant  breast-feeding How should I use this medicine? This vaccine is for injection into a muscle. It is given by a health care professional. A copy of Vaccine Information Statements will be given before each vaccination. Read this sheet carefully each time. The sheet may change frequently. Talk to your pediatrician regarding the use of this medicine in children. While this drug may be prescribed for children as young as 38 weeks old for selected conditions, precautions do apply. Overdosage: If you think you have taken too much of this medicine contact a poison control center or emergency room at once. NOTE: This medicine is only for you. Do not share this medicine with others. What if I miss a dose? It is important not to miss your dose. Call your doctor or health care professional if you are unable to keep an appointment. What may interact with this medicine?  medicines for cancer chemotherapy  medicines that suppress your immune function  steroid medicines like prednisone or cortisone This list may not describe all possible interactions. Give your health care provider a list of all the medicines, herbs, non-prescription drugs, or dietary supplements you use. Also tell them if you smoke, drink alcohol, or use illegal  drugs. Some items may interact with your medicine. What should I watch for while using this medicine? Mild fever and pain should go away in 3 days or less. Report any unusual symptoms to your doctor or health care professional. What side effects may I notice from receiving this medicine? Side  effects that you should report to your doctor or health care professional as soon as possible:  allergic reactions like skin rash, itching or hives, swelling of the face, lips, or tongue  breathing problems  confused  fast or irregular heartbeat  fever over 102 degrees F  seizures  unusual bleeding or bruising  unusual muscle weakness Side effects that usually do not require medical attention (report to your doctor or health care professional if they continue or are bothersome):  aches and pains  diarrhea  fever of 102 degrees F or less  headache  irritable  loss of appetite  pain, tender at site where injected  trouble sleeping This list may not describe all possible side effects. Call your doctor for medical advice about side effects. You may report side effects to FDA at 1-800-FDA-1088. Where should I keep my medicine? This does not apply. This vaccine is given in a clinic, pharmacy, doctor's office, or other health care setting and will not be stored at home. NOTE: This sheet is a summary. It may not cover all possible information. If you have questions about this medicine, talk to your doctor, pharmacist, or health care provider.  2020 Elsevier/Gold Standard (2014-03-22 10:27:27)  Cholesterol Content in Foods Cholesterol is a waxy, fat-like substance that helps to carry fat in the blood. The body needs cholesterol in small amounts, but too much cholesterol can cause damage to the arteries and heart. Most people should eat less than 200 milligrams (mg) of cholesterol a day. Foods with cholesterol  Cholesterol is found in animal-based foods, such as meat, seafood, and dairy. Generally, low-fat dairy and lean meats have less cholesterol than full-fat dairy and fatty meats. The milligrams of cholesterol per serving (mg per serving) of common cholesterol-containing foods are listed below. Meat and other proteins  Egg - one large whole egg has 186 mg.  Veal shank - 4  oz has 141 mg.  Lean ground [place] (93% lean) - 4 oz has 118 mg.  Fat-trimmed lamb loin - 4 oz has 106 mg.  Lean ground beef (90% lean) - 4 oz has 100 mg.  Lobster - 3.5 oz has 90 mg.  Pork loin chops - 4 oz has 86 mg.  Canned salmon - 3.5 oz has 83 mg.  Fat-trimmed beef top loin - 4 oz has 78 mg.  Frankfurter - 1 frank (3.5 oz) has 77 mg.  Crab - 3.5 oz has 71 mg.  Roasted chicken without skin, white meat - 4 oz has 66 mg.  Light bologna - 2 oz has 45 mg.  Deli-cut [place]key - 2 oz has 31 mg.  Canned tuna - 3.5 oz has 31 mg.  Bacon - 1 oz has 29 mg.  Oysters and mussels (raw) - 3.5 oz has 25 mg.  Mackerel - 1 oz has 22 mg.  Trout - 1 oz has 20 mg.  Pork sausage - 1 link (1 oz) has 17 mg.  Salmon - 1 oz has 16 mg.  Tilapia - 1 oz has 14 mg. Dairy  Soft-serve ice cream -  cup (4 oz) has 103 mg.  Whole-milk yogurt - 1 cup (8 oz) has 29 mg.  Cheddar  cheese - 1 oz has 28 mg.  American cheese - 1 oz has 28 mg.  Whole milk - 1 cup (8 oz) has 23 mg.  2% milk - 1 cup (8 oz) has 18 mg.  Cream cheese - 1 tablespoon (Tbsp) has 15 mg.  Cottage cheese -  cup (4 oz) has 14 mg.  Low-fat (1%) milk - 1 cup (8 oz) has 10 mg.  Sour cream - 1 Tbsp has 8.5 mg.  Low-fat yogurt - 1 cup (8 oz) has 8 mg.  Nonfat Greek yogurt - 1 cup (8 oz) has 7 mg.  Half-and-half cream - 1 Tbsp has 5 mg. Fats and oils  Cod liver oil - 1 tablespoon (Tbsp) has 82 mg.  Butter - 1 Tbsp has 15 mg.  Lard - 1 Tbsp has 14 mg.  Bacon grease - 1 Tbsp has 14 mg.  Mayonnaise - 1 Tbsp has 5-10 mg.  Margarine - 1 Tbsp has 3-10 mg. Exact amounts of cholesterol in these foods may vary depending on specific ingredients and brands. Foods without cholesterol Most plant-based foods do not have cholesterol unless you combine them with a food that has cholesterol. Foods without cholesterol include:  Grains and cereals.  Vegetables.  Fruits.  Vegetable oils, such as olive, canola, and  sunflower oil.  Legumes, such as peas, beans, and lentils.  Nuts and seeds.  Egg whites. Summary  The body needs cholesterol in small amounts, but too much cholesterol can cause damage to the arteries and heart.  Most people should eat less than 200 milligrams (mg) of cholesterol a day. This information is not intended to replace advice given to you by your health care provider. Make sure you discuss any questions you have with your health care provider. Document Released: 02/09/2017 Document Revised: 05/28/2017 Document Reviewed: 02/09/2017 Elsevier Patient Education  2020 [employer]

## 2019-02-01 NOTE — Progress Notes (Signed)
Virtual Visit via Video Note  I connected with Craig Johnson   on 02/01/19 at  3:40 PM EDT by a video enabled telemedicine application and verified that I am speaking with the correct person using two identifiers.  Location patient: car  Location provider:work or home office Persons participating in the virtual visit: patient, provider  I discussed the limitations of evaluation and management by telemedicine and the availability of in person appointments. The patient expressed understanding and agreed to proceed.   HPI: 1. HTN BP still uncontrolled on olmesartan 40/norvasc 10/hctz 12.5, sprionolactone 50 mg qd and coreg 3.125 mg bid BP still elevated 130/90 and home readings per pt dbp >80s  2. DM 2 A1C 7.1 01/18/19 f/u endocrine Dr. Dwyane Dee on insulin pump, jardiacne 25, metformin XR 1500 mg qd will message Dr. Dwyane Dee as this is on recall and see if he wants to change this  -he is due for DM eye exam will refer  3. HLD elevated TGs 496 uncontrolled he was on lipitor 40 but now taking 20 and zetia 10 and stopped Tricor 145 as of 11/05/2018 want to try this trial to get TGS down as caution must be used with statin and fibrates and pt was taking tricor 145 for a while w/o improvement of TGs 4. C/o cramps in his hands and hands spasm or jerky 2 weeks ago and numbness in his left hand fingers. Denies neck pain and CTS sxs in hands  5. Foot pain improved  6. Annual   ROS: See pertinent positives and negatives per HPI. General: wt stable  HEENT: no sore throat  CV: no chest pain  Lungs: no sob GI : no ab pain  MSK: +cramps in hands but no foot pain  Neuro: no h/a Psych: no anxiety/depression Skin: no issues  GU: no issues  Past Medical History:  Diagnosis Date  . Anxiety   . Depression   . Diabetes mellitus without complication (HCC)    Type 2  . DKA (diabetic ketoacidoses) ([place])   . Heart murmur   . Hypertension   . Sleep apnea     No past surgical history on file.  Family  History  Problem Relation Age of Onset  . Diabetes Father   . Hypertension Father   . Diabetes Brother   . Hypertension Mother   . 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  atorvastatin (LIPITOR) 20 MG tablet, Take 1 tablet (20 mg total) by mouth daily at 6 PM. Informed pt to reduce dose cut 40 mg in 1/2=20 mg new Rx will be 20 mg do not cut in 1/2, Disp: 90 tablet, Rfl: 3 .  carvedilol (COREG) 6.25 MG tablet, Take 1 tablet (6.25 mg total) by mouth 2 (two) times daily with a meal., Disp: 180 tablet, Rfl: 3 .  Continuous Blood Gluc Receiver (Marydel) DEVI, 1 each by Does not apply route See admin instructions. Use reader to monitor blood glucose continuously., Disp: 1 Device, Rfl: 0 .  Continuous Blood Gluc Sensor (DEXCOM G6 SENSOR) MISC, 1 each by Does not apply route See admin instructions. Apply 1 sensor to body once every 10 days to monitor blood glucose., Disp: 3 each, Rfl: 11 .  Continuous Blood Gluc Transmit (DEXCOM G6 TRANSMITTER) MISC, 1 each by Does not apply route See admin instructions. Use 1 transmitter once every 90 days in conjunction with Dexcom G6 sensor and reader.,  Disp: 1 each, Rfl: 4 .  ezetimibe (ZETIA) 10 MG tablet, Take 1 tablet (10 mg total) by mouth daily., Disp: 90 tablet, Rfl: 3 .  Insulin Disposable Pump (OMNIPOD DASH 5 PACK PODS) MISC, Inject 1 each into the skin every 3 (three) days., Disp: 15 each, Rfl: 2 .  Insulin Disposable Pump (OMNIPOD DASH SYSTEM) KIT, 1 each by Does not apply route daily. Use Omnipod PDM to administer insulin daily., Disp: 1 kit, Rfl: 0 .  insulin lispro (HUMALOG) 100 UNIT/ML injection, Use max of 80 units per day via omnipod insulin pump., Disp: 30 mL, Rfl: 3 .  JARDIANCE 25 MG TABS tablet, TAKE 1 TABLET BY MOUTH EVERY DAY, Disp: 30 tablet, Rfl: 1 .  metFORMIN (GLUCOPHAGE-XR) 750 MG 24 hr tablet, TAKE 2 TABLETS (1,500 MG TOTAL) BY MOUTH DAILY WITH BREAKFAST.,  Disp: 60 tablet, Rfl: 5 .  Olmesartan-amLODIPine-HCTZ 40-10-12.5 MG TABS, Take 1 tablet by mouth daily. In am, Disp: 90 tablet, Rfl: 3 .  potassium chloride (K-DUR) 10 MEQ tablet, Take 1 tablet (10 mEq total) by mouth daily., Disp: 90 tablet, Rfl: 3 .  spironolactone (ALDACTONE) 50 MG tablet, Take 1 tablet (50 mg total) by mouth daily. In am, Disp: 90 tablet, Rfl: 3  EXAM:  VITALS per patient if applicable:  GENERAL: alert, oriented, appears well and in no acute distress  HEENT: atraumatic, conjunttiva clear, no obvious abnormalities on inspection of external nose and ears  NECK: normal movements of the head and neck  LUNGS: on inspection no signs of respiratory distress, breathing rate appears normal, no obvious gross SOB, gasping or wheezing  CV: no obvious cyanosis  MS: moves all visible extremities without noticeable abnormality  PSYCH/NEURO: pleasant and cooperative, no obvious depression or anxiety, speech and thought processing grossly intact  ASSESSMENT AND PLAN:  Discussed the following assessment and plan:  Annual  Flu shot utd consider in future  Consider Tdap and pna 23 vx in future -mailed info about flu and pna 23  Consider MMR Hep B immune Declines STD check  PSA 04/11/18 0.7  Colonoscopy age 44 y.o  rec healthy diet and exercise    Essential hypertension - Plan: Basic Metabolic Panel (BMET), CBC with Differential/Platelet, Lipid panel Monitor BP goal <130/<80 and increased coreg dose from 3.125 bid to 6.25 mg bid spironolactone (ALDACTONE) 50 MG tablet in am, Olmesartan-amLODIPine-HCTZ 40-10-12.5 MG TABS, carvedilol (COREG) 6.25 MG tablet increased from 3.125 mg bid dose   Leg cramps - Plan: Basic Metabolic Panel (BMET), Magnesium, CK (Creatine Kinase)  Hyperlipidemia, unspecified hyperlipidemia type - Plan: Lipid panel, ezetimibe (ZETIA) 10 MG tablet, atorvastatin (LIPITOR) 20 MG tablet. He had been on tricor 145 long term w/o  Improvement of Tgs disc  healthy diet options today as well  Caution must be used with statins and fenofibrates as well  -pt to be called to schedule   Type 2 diabetes mellitus with hyperglycemia, without long-term current use of insulin ([place]) - Plan: Ambulatory referral to Ophthalmology referred my eye doctor Diabetic eye exam overdue  -do foot exam in future  -urine had 09/22/18  Hypomagnesemia - Plan: Magnesium  B12 deficiency - Plan: B12   [facility] in [place] way to reach pt is my chart messages of note  I discussed the assessment and treatment plan with the patient. The patient was provided an opportunity to ask questions and all were answered. The patient agreed with the plan  and demonstrated an understanding of the instructions.   The patient was advised to call back or seek an in-person evaluation if the symptoms worsen or if the condition fails to improve as anticipated.  Time spent 20 minute s  Delorise Jackson, MD

## 2019-02-08 ENCOUNTER — Telehealth: Payer: Self-pay | Admitting: Internal Medicine

## 2019-02-08 NOTE — Telephone Encounter (Signed)
I called pt and left a vm to call ofc to sch fasting labs before appt in November. Also pt will need flu shot upcoming nurse visit thanks  High dose flu shot

## 2019-02-14 ENCOUNTER — Ambulatory Visit: Payer: Managed Care, Other (non HMO) | Admitting: Endocrinology

## 2019-03-09 ENCOUNTER — Ambulatory Visit (INDEPENDENT_AMBULATORY_CARE_PROVIDER_SITE_OTHER): Payer: Managed Care, Other (non HMO)

## 2019-03-09 ENCOUNTER — Encounter: Payer: Self-pay | Admitting: Family Medicine

## 2019-03-09 ENCOUNTER — Ambulatory Visit (INDEPENDENT_AMBULATORY_CARE_PROVIDER_SITE_OTHER): Payer: Managed Care, Other (non HMO) | Admitting: Family Medicine

## 2019-03-09 ENCOUNTER — Other Ambulatory Visit: Payer: Self-pay

## 2019-03-09 VITALS — BP 154/92 | HR 72 | Temp 98.3°F | Resp 18 | Ht 72.0 in | Wt 183.0 lb

## 2019-03-09 DIAGNOSIS — M791 Myalgia, unspecified site: Secondary | ICD-10-CM | POA: Diagnosis not present

## 2019-03-09 DIAGNOSIS — M25512 Pain in left shoulder: Secondary | ICD-10-CM | POA: Diagnosis not present

## 2019-03-09 DIAGNOSIS — M542 Cervicalgia: Secondary | ICD-10-CM

## 2019-03-09 MED ORDER — KETOROLAC TROMETHAMINE 60 MG/2ML IM SOLN
60.0000 mg | Freq: Once | INTRAMUSCULAR | Status: AC
  Administered 2019-03-09: 09:00:00 60 mg via INTRAMUSCULAR

## 2019-03-09 MED ORDER — METHYLPREDNISOLONE 4 MG PO TBPK: ORAL_TABLET | ORAL | 0 refills | Status: DC

## 2019-03-09 MED ORDER — METHYLPREDNISOLONE ACETATE 40 MG/ML IJ SUSP
40.0000 mg | Freq: Once | INTRAMUSCULAR | Status: AC
  Administered 2019-03-09: 40 mg via INTRAMUSCULAR

## 2019-03-09 NOTE — Progress Notes (Signed)
Subjective:    Patient ID: Craig Johnson, male    DOB: 1970/09/03, 48 y.o.   MRN: [id number]  HPI   Patient presents to clinic complaining of left-sided neck/left shoulder pain for about a week.  States he went to urgent care last week and was given steroid taper, but pain has not gone away completely.  Denies any new known injury to neck or shoulder.  States he has injured his neck previously in high school while playing sports.  Denies any loss of grip, does feel numbness and tingling in left upper extremity off and on; but the numbness and tingling is not severe or persistent.  No fever or chills.  Patient Active Problem List   Diagnosis Date Noted  . Annual physical exam 02/01/2019  . Hypokalemia 10/04/2018  . Acute pain of left shoulder 04/01/2018  . Erectile dysfunction 08/25/2016  . GERD (gastroesophageal reflux disease) 03/17/2015  . Type 2 diabetes mellitus with hyperglycemia, with long-term current use of insulin ([place]) 05/02/2014  . HTN (hypertension) 05/02/2014  . Hyperlipidemia 05/02/2014  . Mitral valve regurgitation 05/02/2014   Social History   Tobacco Use  . Smoking status: Never Smoker  . Smokeless tobacco: Never Used  Substance Use Topics  . Alcohol use: Yes   Review of Systems  Constitutional: Negative for chills, fatigue and fever.  HENT: Negative for congestion, ear pain, sinus pain and sore throat.   Eyes: Negative.   Respiratory: Negative for cough, shortness of breath and wheezing.   Cardiovascular: Negative for chest pain, palpitations and leg swelling.  Gastrointestinal: Negative for abdominal pain, diarrhea, nausea and vomiting.  Genitourinary: Negative for dysuria, frequency and urgency.  Musculoskeletal: +left neck/left shoulder pain Skin: Negative for color change, pallor and rash.  Neurological: Negative for syncope, light-headedness and headaches.  Psychiatric/Behavioral: The patient is not nervous/anxious.       Objective:   Physical  Exam Vitals signs and nursing note reviewed.  Constitutional:      Appearance: He is not ill-appearing, toxic-appearing or diaphoretic.  Cardiovascular:     Rate and Rhythm: Normal rate and regular rhythm.     Comments: Radial and ulnar pulses normal Pulmonary:     Effort: Pulmonary effort is normal.     Breath sounds: Normal breath sounds.  Musculoskeletal:     Cervical back: He exhibits tenderness.       Back:     Comments: Left sided neck pain that radiates to left shoulder/arm  UE strength equal. Grips equal.  Can reach left arm straight up above head straight up to side and straight back without issues.  Can reach left arm across chest to grab right shoulder.  When turning head to left side does feel a pulling type pain in muscles.  Able to feel light touch in all fingers.  Skin:    General: Skin is warm and dry.     Coloration: Skin is not jaundiced or pale.     Findings: No erythema.  Neurological:     General: No focal deficit present.     Mental Status: He is alert and oriented to person, place, and time.     Cranial Nerves: No cranial nerve deficit.     Motor: No weakness.  Psychiatric:        Mood and Affect: Mood normal.        Behavior: Behavior normal.    Today's Vitals   03/09/19 0823  BP: (!) 154/92  Pulse: 72  Resp: 18  Temp:  98.3 F (36.8 C)  TempSrc: Oral  SpO2: 94%  Weight: 183 lb (83 kg)  Height: 6' (1.829 m)   Body mass index is 24.82 kg/m.    Assessment & Plan:    Neck pain/pain/left shoulder pain-suspect patient's pain is more related to neck and muscle mass and shoulder joint.  We will get x-ray of neck to further investigate.  Advised to do stretching and range of motion exercises with arms to help, stretch muscles.  He will also do another oral steroid taper.  Discussed option of referral to physical therapy, we will wait and see how this current treatment plan works before please referral.  Advised he can do topical treatments like heating  pad, or Biofreeze to help pain.  Administrations This Visit    ketorolac (TORADOL) injection 60 mg    Admin Date 03/09/2019 Action Given Dose 60 mg Route Intramuscular Administered By Clearnce SorrelPickard, Jill S, RMA       methylPREDNISolone acetate (DEPO-MEDROL) injection 40 mg    Admin Date 03/09/2019 Action Given Dose 40 mg Route Intramuscular Administered By Clearnce SorrelPickard, Jill S, RMA         Patient will let us know if pain not improving over the next 2 to 3 weeks.  He is aware he can call office anytime with questions or concerns.

## 2019-03-09 NOTE — Patient Instructions (Signed)
Shoulder Range of Motion Exercises Shoulder range of motion (ROM) exercises are done to keep the shoulder moving freely or to increase movement. They are often recommended for people who have shoulder pain or stiffness or who are recovering from a shoulder surgery. Phase 1 exercises When you are able, do this exercise 1-2 times per day for 30-60 seconds in each direction, or as directed by your health care provider. Pendulum exercise To do this exercise while sitting: 1. Sit in a chair or at the edge of your bed with your feet flat on the floor. 2. Let your affected arm hang down in front of you over the edge of the bed or chair. 3. Relax your shoulder, arm, and hand. 4. Rock your body so your arm gently swings in small circles. You can also use your unaffected arm to start the motion. 5. Repeat changing the direction of the circles, swinging your arm left and right, and swinging your arm forward and back. To do this exercise while standing: 1. Stand next to a sturdy chair or table, and hold on to it with your hand on your unaffected side. 2. Bend forward at the waist. 3. Bend your knees slightly. 4. Relax your shoulder, arm, and hand. 5. While keeping your shoulder relaxed, use body motion to swing your arm in small circles. 6. Repeat changing the direction of the circles, swinging your arm left and right, and swinging your arm forward and back. 7. Between exercises, stand up tall and take a short break to relax your lower back.  Phase 2 exercises Do these exercises 1-2 times per day or as told by your health care provider. Hold each stretch for 30 seconds, and repeat 3 times. Do the exercises with one or both arms as instructed by your health care provider. For these exercises, sit at a table with your hand and arm supported by the table. A chair that slides easily or has wheels can be helpful. External rotation 1. Turn your chair so that your affected side is nearest to the table. 2.  Place your forearm on the table to your side. Bend your elbow about 90 at the elbow (right angle) and place your hand palm facing down on the table. Your elbow should be about 6 inches away from your side. 3. Keeping your arm on the table, lean your body forward. Abduction 1. Turn your chair so that your affected side is nearest to the table. 2. Place your forearm and hand on the table so that your thumb points toward the ceiling and your arm is straight out to your side. 3. Slide your hand out to the side and away from you, using your unaffected arm to do the work. 4. To increase the stretch, you can slide your chair away from the table. Flexion: forward stretch 1. Sit facing the table. Place your hand and elbow on the table in front of you. 2. Slide your hand forward and away from you, using your unaffected arm to do the work. 3. To increase the stretch, you can slide your chair backward. Phase 3 exercises Do these exercises 1-2 times per day or as told by your health care provider. Hold each stretch for 30 seconds, and repeat 3 times. Do the exercises with one or both arms as instructed by your health care provider. Cross-body stretch: posterior capsule stretch 1. Lift your arm straight out in front of you. 2. Bend your arm 90 at the elbow (right angle) so your forearm   moves across your body. 3. Use your other arm to gently pull the elbow across your body, toward your other shoulder. Wall climbs 1. Stand with your affected arm extended out to the side with your hand resting on a door frame. 2. Slide your hand slowly up the door frame. 3. To increase the stretch, step through the door frame. Keep your body upright and do not lean. Wand exercises You will need a cane, a piece of PVC pipe, or a sturdy wooden dowel for wand exercises. Flexion To do this exercise while standing: 1. Hold the wand with both of your hands, palms down. 2. Using the other arm to help, lift your arms up and over  your head, if able. 3. Push upward with your other arm to gently increase the stretch. To do this exercise while lying down: 1. Lie on your back with your elbows resting on the floor and the wand in both your hands. Your hands will be palm down, or pointing toward your feet. 2. Lift your hands toward the ceiling, using your unaffected arm to help if needed. 3. Bring your arms overhead as able, using your unaffected arm to help if needed. Internal rotation 1. Stand while holding the wand behind you with both hands. Your unaffected arm should be extended above your head with the arm of the affected side extended behind you at the level of your waist. The wand should be pointing straight up and down as you hold it. 2. Slowly pull the wand up behind your back by straightening the elbow of your unaffected arm and bending the elbow of your affected arm. External rotation 1. Lie on your back with your affected upper arm supported on a small pillow or rolled towel. When you first do this exercise, keep your upper arm close to your body. Over time, bring your arm up to a 90 angle out to the side. 2. Hold the wand across your stomach and with both hands palm up. Your elbow on your affected side should be bent at a 90 angle. 3. Use your unaffected side to help push your forearm away from you and toward the floor. Keep your elbow on your affected side bent at a 90 angle. Contact a health care provider if you have:  New or increasing pain.  New numbness, tingling, weakness, or discoloration in your arm or hand. This information is not intended to replace advice given to you by your health care provider. Make sure you discuss any questions you have with your health care provider. Document Released: 03/14/2003 Document Revised: 07/28/2017 Document Reviewed: 07/28/2017 Elsevier Patient Education  2020 [employer]   Neck Exercises Ask your health care provider which exercises are safe for you. Do  exercises exactly as told by your health care provider and adjust them as directed. It is normal to feel mild stretching, pulling, tightness, or discomfort as you do these exercises. Stop right away if you feel sudden pain or your pain gets worse. Do not begin these exercises until told by your health care provider. Neck exercises can be important for many reasons. They can improve strength and maintain flexibility in your neck, which will help your upper back and prevent neck pain. Stretching exercises Rotation neck stretching  1. Sit in a chair or stand up. 2. Place your feet flat on the floor, shoulder width apart. 3. Slowly turn your head (rotate) to the right until a slight stretch is felt. Turn it all the way to the  right so you can look over your right shoulder. Do not tilt or tip your head. 4. Hold this position for 10-30 seconds. 5. Slowly turn your head (rotate) to the left until a slight stretch is felt. Turn it all the way to the left so you can look over your left shoulder. Do not tilt or tip your head. 6. Hold this position for 10-30 seconds. Repeat __________ times. Complete this exercise __________ times a day. Neck retraction 1. Sit in a sturdy chair or stand up. 2. Look straight ahead. Do not bend your neck. 3. Use your fingers to push your chin backward (retraction). Do not bend your neck for this movement. Continue to face straight ahead. If you are doing the exercise properly, you will feel a slight sensation in your throat and a stretch at the back of your neck. 4. Hold the stretch for 1-2 seconds. Repeat __________ times. Complete this exercise __________ times a day. Strengthening exercises Neck press 1. Lie on your back on a firm bed or on the floor with a pillow under your head. 2. Use your neck muscles to push your head down on the pillow and straighten your spine. 3. Hold the position as well as you can. Keep your head facing up (in a neutral position) and your chin  tucked. 4. Slowly count to 5 while holding this position. Repeat __________ times. Complete this exercise __________ times a day. Isometrics These are exercises in which you strengthen the muscles in your neck while keeping your neck still (isometrics). 1. Sit in a supportive chair and place your hand on your forehead. 2. Keep your head and face facing straight ahead. Do not flex or extend your neck while doing isometrics. 3. Push forward with your head and neck while pushing back with your hand. Hold for 10 seconds. 4. Do the sequence again, this time putting your hand against the back of your head. Use your head and neck to push backward against the hand pressure. 5. Finally, do the same exercise on either side of your head, pushing sideways against the pressure of your hand. Repeat __________ times. Complete this exercise __________ times a day. Prone head lifts 1. Lie face-down (prone position), resting on your elbows so that your chest and upper back are raised. 2. Start with your head facing downward, near your chest. Position your chin either on or near your chest. 3. Slowly lift your head upward. Lift until you are looking straight ahead. Then continue lifting your head as far back as you can comfortably stretch. 4. Hold your head up for 5 seconds. Then slowly lower it to your starting position. Repeat __________ times. Complete this exercise __________ times a day. Supine head lifts 1. Lie on your back (supine position), bending your knees to point to the ceiling and keeping your feet flat on the floor. 2. Lift your head slowly off the floor, raising your chin toward your chest. 3. Hold for 5 seconds. Repeat __________ times. Complete this exercise __________ times a day. Scapular retraction 1. Stand with your arms at your sides. Look straight ahead. 2. Slowly pull both shoulders (scapulae) backward and downward (retraction) until you feel a stretch between your shoulder blades in  your upper back. 3. Hold for 10-30 seconds. 4. Relax and repeat. Repeat __________ times. Complete this exercise __________ times a day. Contact a health care provider if:  Your neck pain or discomfort gets much worse when you do an exercise.  Your neck pain or  discomfort does not improve within 2 hours after you exercise. If you have any of these problems, stop exercising right away. Do not do the exercises again unless your health care provider says that you can. Get help right away if:  You develop sudden, severe neck pain. If this happens, stop exercising right away. Do not do the exercises again unless your health care provider says that you can. This information is not intended to replace advice given to you by your health care provider. Make sure you discuss any questions you have with your health care provider. Document Released: 05/27/2015 Document Revised: 04/13/2018 Document Reviewed: 04/13/2018 Elsevier Patient Education  2020 [employer]

## 2019-03-16 ENCOUNTER — Encounter: Payer: Self-pay | Admitting: Internal Medicine

## 2019-03-22 ENCOUNTER — Other Ambulatory Visit: Payer: Self-pay | Admitting: Endocrinology

## 2019-03-24 ENCOUNTER — Other Ambulatory Visit: Payer: Self-pay

## 2019-03-24 ENCOUNTER — Ambulatory Visit (INDEPENDENT_AMBULATORY_CARE_PROVIDER_SITE_OTHER): Payer: Managed Care, Other (non HMO) | Admitting: Family Medicine

## 2019-03-24 ENCOUNTER — Other Ambulatory Visit: Payer: Self-pay | Admitting: Internal Medicine

## 2019-03-24 VITALS — BP 128/80 | HR 69 | Temp 95.9°F | Resp 18 | Ht 72.0 in | Wt 184.1 lb

## 2019-03-24 DIAGNOSIS — R200 Anesthesia of skin: Secondary | ICD-10-CM | POA: Diagnosis not present

## 2019-03-24 DIAGNOSIS — R202 Paresthesia of skin: Secondary | ICD-10-CM

## 2019-03-24 DIAGNOSIS — M542 Cervicalgia: Secondary | ICD-10-CM | POA: Diagnosis not present

## 2019-03-24 MED ORDER — METHYLPREDNISOLONE ACETATE 40 MG/ML IJ SUSP
40.0000 mg | Freq: Once | INTRAMUSCULAR | Status: AC
  Administered 2019-03-24: 40 mg via INTRAMUSCULAR

## 2019-03-24 MED ORDER — OXYCODONE-ACETAMINOPHEN 5-325 MG PO TABS: 1.0000 | ORAL_TABLET | Freq: Two times a day (BID) | ORAL | 0 refills | Status: DC | PRN

## 2019-03-24 MED ORDER — CYCLOBENZAPRINE HCL 5 MG PO TABS: 5.0000 mg | ORAL_TABLET | Freq: Every evening | ORAL | 1 refills | Status: DC | PRN

## 2019-03-24 NOTE — Patient Instructions (Signed)

## 2019-03-24 NOTE — Progress Notes (Signed)
Subjective:    Patient ID: Craig Johnson, male    DOB: August 18, 1970, 48 y.o.   MRN: [id number]  HPI   Patient presents to clinic in planing of continued neck pain and some numbness and tingling that is now radiating down left arm.  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  States Tylenol and ibuprofen will dull the pain worse for time but then pain comes back.  States steroid taper down did calm the pain for the duration of the steroid, but that slowly came back after stopping the steroid.  Patient Active Problem List   Diagnosis Date Noted  . Annual physical exam 02/01/2019  . Hypokalemia 10/04/2018  . Acute pain of left shoulder 04/01/2018  . Erectile dysfunction 08/25/2016  . GERD (gastroesophageal reflux disease) 03/17/2015  . Type 2 diabetes mellitus with hyperglycemia, with long-term current use of insulin ([place]) 05/02/2014  . HTN (hypertension) 05/02/2014  . Hyperlipidemia 05/02/2014  . Mitral valve regurgitation 05/02/2014   Social History   Tobacco Use  . Smoking status: Never Smoker  . Smokeless tobacco: Never Used  Substance Use Topics  . Alcohol use: Yes   Review of Systems  Constitutional: Negative for chills, fatigue and fever.  HENT: Negative for congestion, ear pain, sinus pain and sore throat.   Eyes: Negative.   Respiratory: Negative for cough, shortness of breath and wheezing.   Cardiovascular: Negative for chest pain, palpitations and leg swelling.  Gastrointestinal: Negative for abdominal pain, diarrhea, nausea and vomiting.  Genitourinary: Negative for dysuria, frequency and urgency.  Musculoskeletal: +neck pain and left arm numb/tingling.  Skin: Negative for color change, pallor and rash.  Neurological: Negative for syncope, light-headedness and headaches.  Psychiatric/Behavioral: The patient is not nervous/anxious.       Objective:   Physical Exam Vitals signs and nursing note reviewed.   Constitutional:      General: He is not in acute distress.    Appearance: He is not ill-appearing, toxic-appearing or diaphoretic.  HENT:     Head: Normocephalic and atraumatic.  Eyes:     General: No scleral icterus.    Extraocular Movements: Extraocular movements intact.     Pupils: Pupils are equal, round, and reactive to light.  Neck:     Musculoskeletal: Full passive range of motion without pain. No neck rigidity or crepitus.     Trachea: Trachea and phonation normal.     Comments: Grip strength equal and strong.  Able to raise both arms up above head without any problems.  Able to hold arm straight out in front of him and resist me pushing down. Cardiovascular:     Rate and Rhythm: Normal rate and regular rhythm.     Heart sounds: Normal heart sounds.  Pulmonary:     Effort: Pulmonary effort is normal. No respiratory distress.     Breath sounds: Normal breath sounds.  Musculoskeletal:        General: No deformity.  Skin:    General: Skin is warm and dry.  Neurological:     Mental Status: He is alert and oriented to person, place, and time.     Gait: Gait normal.  Psychiatric:        Mood and Affect: Mood normal.        Behavior: Behavior normal.       Today's Vitals   03/24/19 1424  BP: 128/80  Pulse: 69  Resp: 18  Temp: (!) 95.9 F (35.5 C)  TempSrc: Temporal  SpO2: 96%  Weight: 184 lb 1.9 oz (83.5 kg)  Height: 6' (1.829 m)   Body mass index is 24.97 kg/m.     Assessment & Plan:    Neck pain/numbness and tingling in left upper extremity-suspect related to her arms related to pinched nerve in her neck.  We will get MRI to further investigate since x-ray did show arthritis.  We will also do physical therapy to help reduce pain.  Advised he can use Tylenol and Motrin as needed and recommended he do range of motion exercises multiple times throughout the day.  Also discussed topical rubs like BenGay, Biofreeze or Voltaren gel to help reduce pain.  IM  methylprednisolone x1 given in clinic to help reduce inflammation and pain.  Administrations This Visit    methylPREDNISolone acetate (DEPO-MEDROL) injection 40 mg    Admin Date 03/24/2019 Action Given Dose 40 mg Route Intramuscular Administered By Clearnce Sorrel, RMA         Patient will keep all regular follow-ups with PCP as planned.  He will return to clinic sooner if any issues arise.  He is aware someone will contact him in regards to referrals for therapy and MRI.

## 2019-04-04 ENCOUNTER — Ambulatory Visit: Payer: Managed Care, Other (non HMO)

## 2019-04-14 ENCOUNTER — Other Ambulatory Visit: Payer: Self-pay | Admitting: Internal Medicine

## 2019-04-14 DIAGNOSIS — M542 Cervicalgia: Secondary | ICD-10-CM

## 2019-04-14 LAB — HM DIABETES EYE EXAM

## 2019-04-17 ENCOUNTER — Encounter: Payer: Self-pay | Admitting: Internal Medicine

## 2019-04-17 ENCOUNTER — Telehealth: Payer: Self-pay | Admitting: Internal Medicine

## 2019-04-17 MED ORDER — OXYCODONE-ACETAMINOPHEN 5-325 MG PO TABS: 1.0000 | ORAL_TABLET | Freq: Two times a day (BID) | ORAL | 0 refills | Status: DC | PRN

## 2019-04-17 NOTE — Telephone Encounter (Signed)
Advise pt this will be last refill of narcotics  If neck pain needs to get MRI neck as ordered  And consider  1. PT  2. F/u with PM&R for injections of steroid to calm down  3. Consider Neurosurgery pending MRI results   Does he want referral to PT now?

## 2019-04-17 NOTE — Telephone Encounter (Signed)
MRI was not covered by insurance, due to provider reasoning? PT is also too expensive.

## 2019-04-18 NOTE — Telephone Encounter (Signed)
Does he want referral for specialist I.e pM&R or neurosurgery?   Craig Johnson

## 2019-04-18 NOTE — Telephone Encounter (Signed)
Patient does not want to do anything at the moment

## 2019-04-26 ENCOUNTER — Other Ambulatory Visit: Payer: Self-pay | Admitting: Endocrinology

## 2019-05-09 ENCOUNTER — Telehealth: Payer: Self-pay | Admitting: Internal Medicine

## 2019-05-09 NOTE — Telephone Encounter (Signed)
I called pt twice and left vm to call ofc. °

## 2019-05-10 ENCOUNTER — Ambulatory Visit: Payer: Managed Care, Other (non HMO) | Admitting: Internal Medicine

## 2019-05-31 IMAGING — CT CT ABDOMEN W/O CM
1 series · 15 of 32 positions shown, 19 images · non-contrast
Comparison: None.

CLINICAL DATA: Hyperaldosteronism. High blood pressure. Evaluate
for adrenal nodule.

EXAM:
CT ABDOMEN WITHOUT CONTRAST
TECHNIQUE: Multidetector CT imaging of the abdomen was performed following the
standard protocol without IV contrast.

[Series 2: axial st · axial · 0.73mm/px · z∈[-714,-420]mm · 15 of 110 slices shown, 19 images]
[im 8/110  soft-tissue]
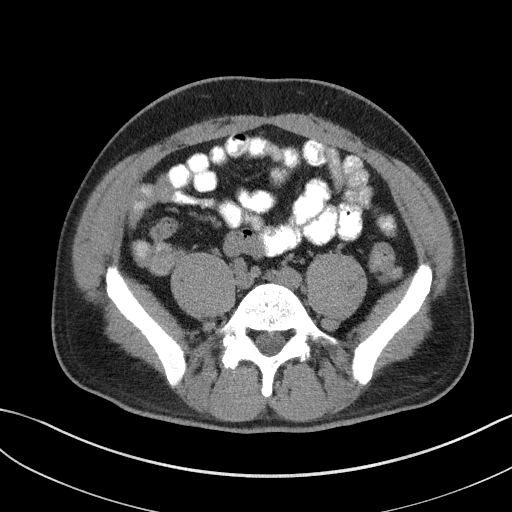
[im 8/110  bone]
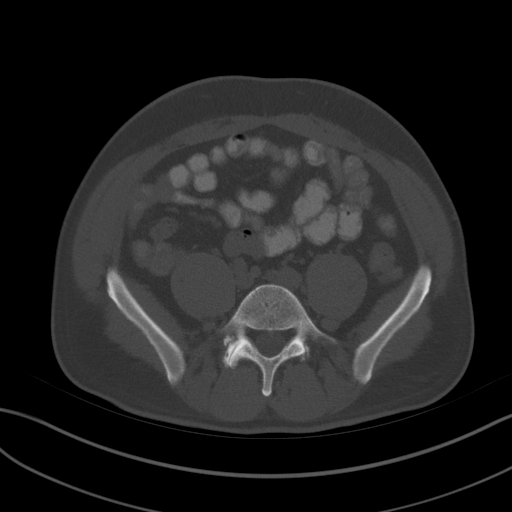
[im 15/110  soft-tissue]
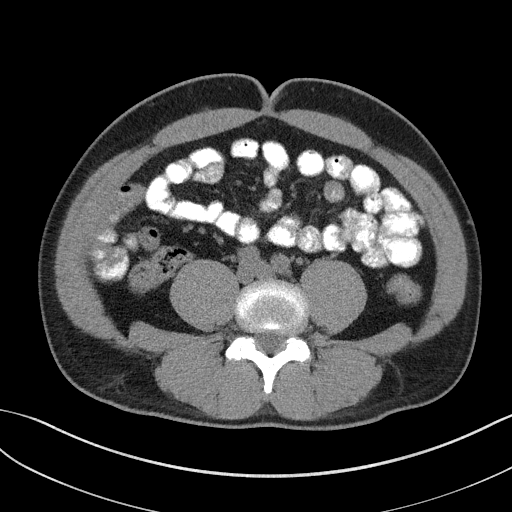
[im 22/110  soft-tissue]
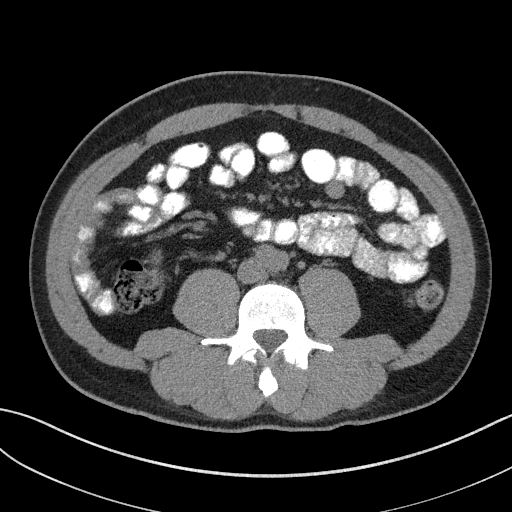
[im 32/110  soft-tissue]
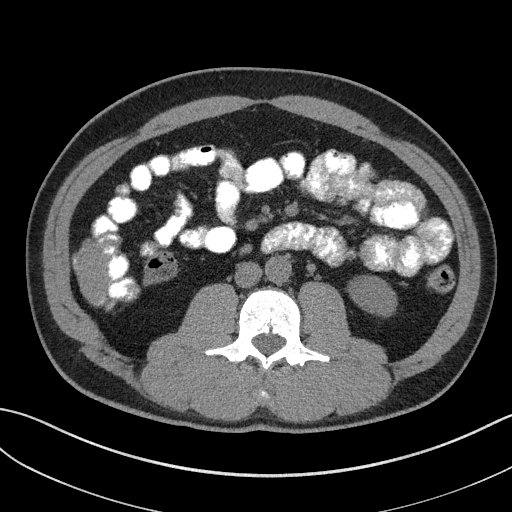
[im 39/110  soft-tissue]
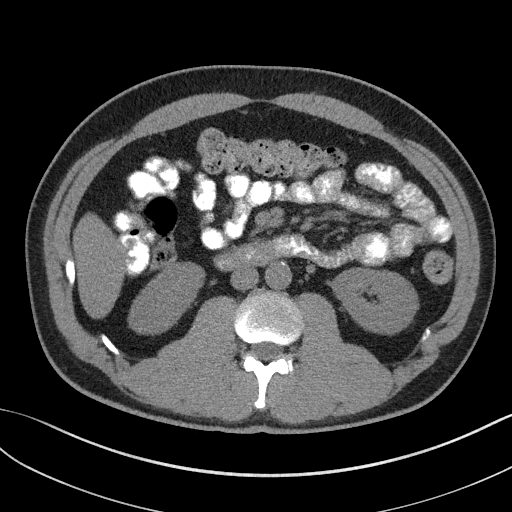
[im 46/110  soft-tissue]
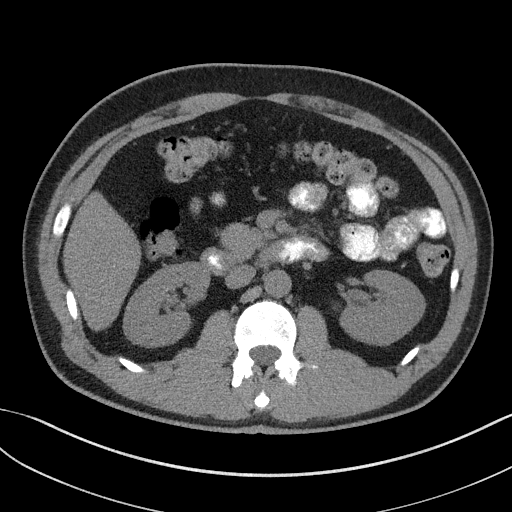
[im 57/110  soft-tissue]
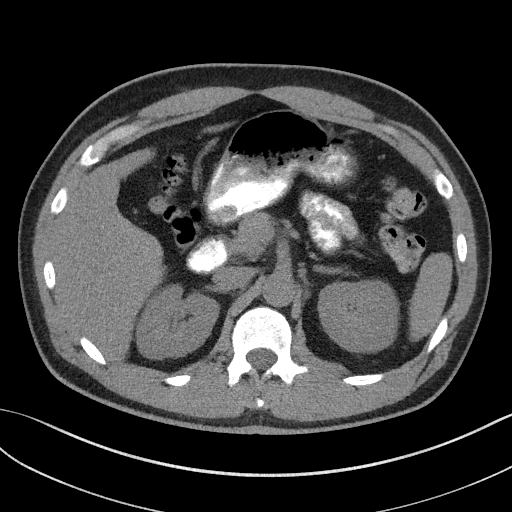
[im 64/110  soft-tissue]
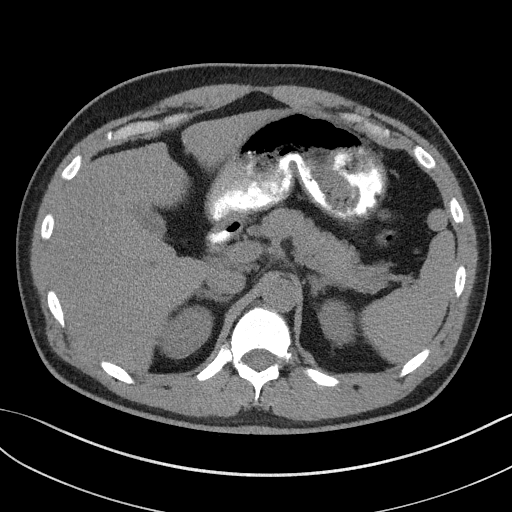
[im 71/110  soft-tissue]
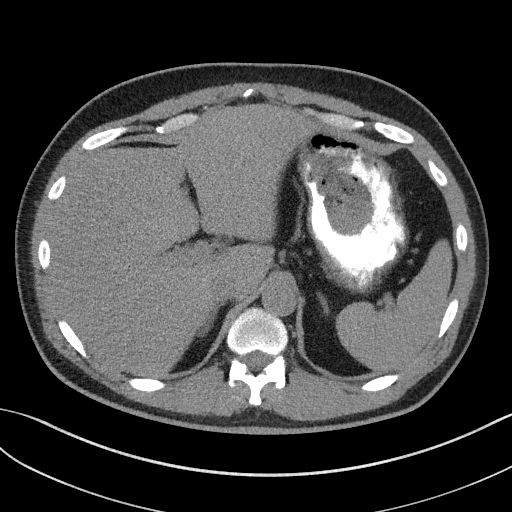
[im 71/110  bone]
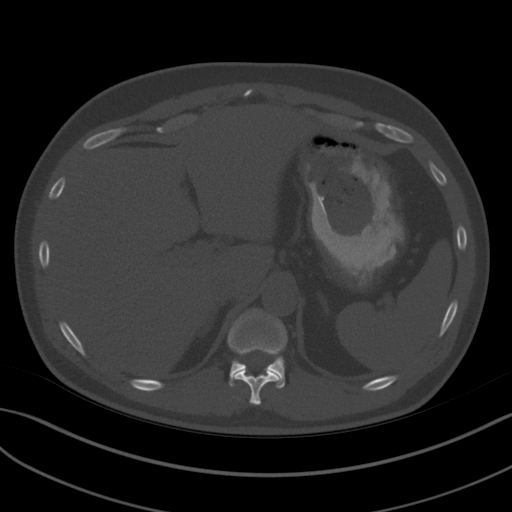
[im 78/110  soft-tissue]
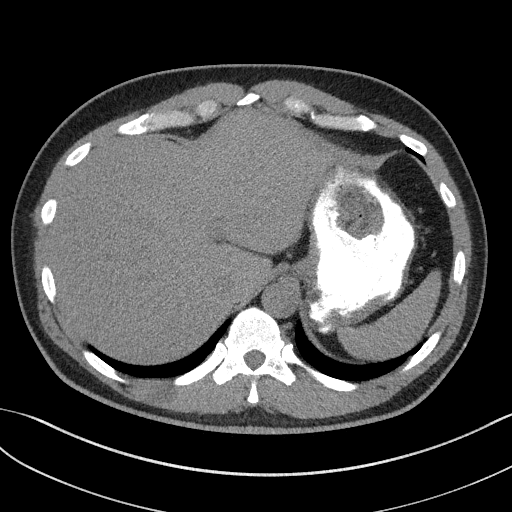
[im 88/110  soft-tissue]
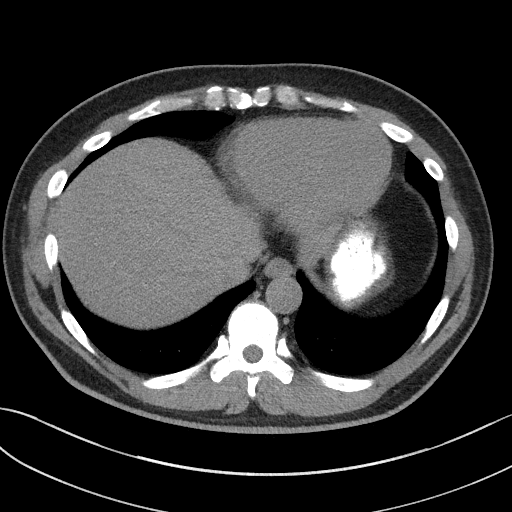
[im 95/110  soft-tissue]
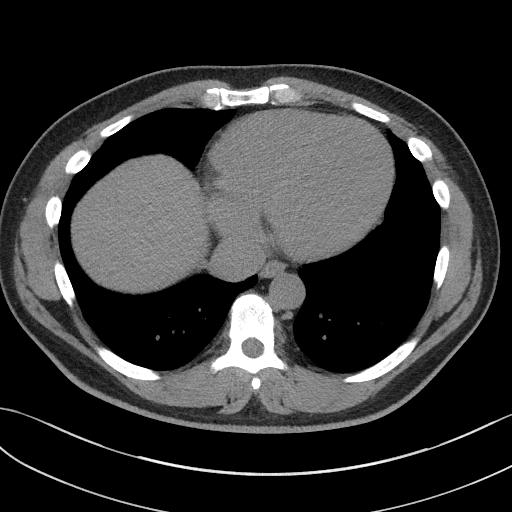
[im 95/110  lung]
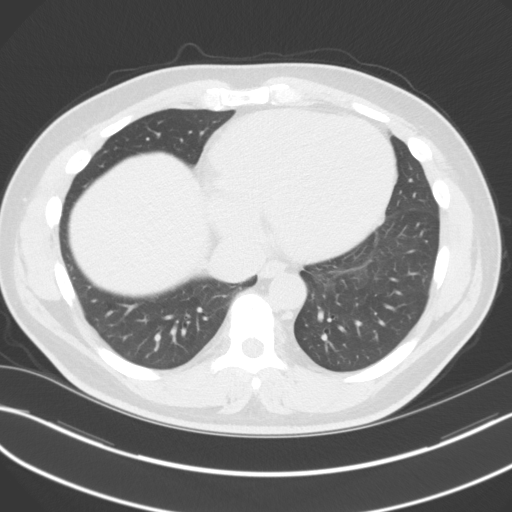
[im 99/110  lung]
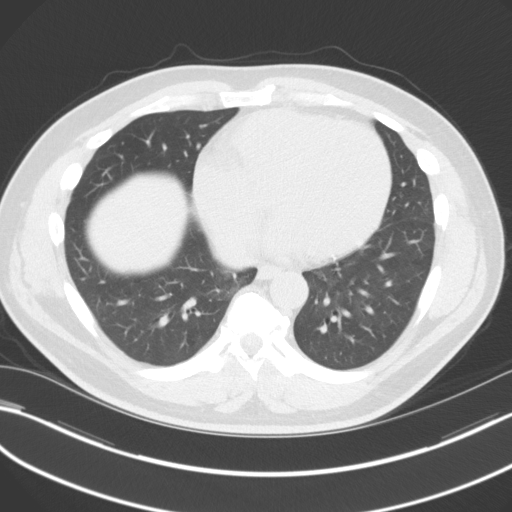
[im 102/110  soft-tissue]
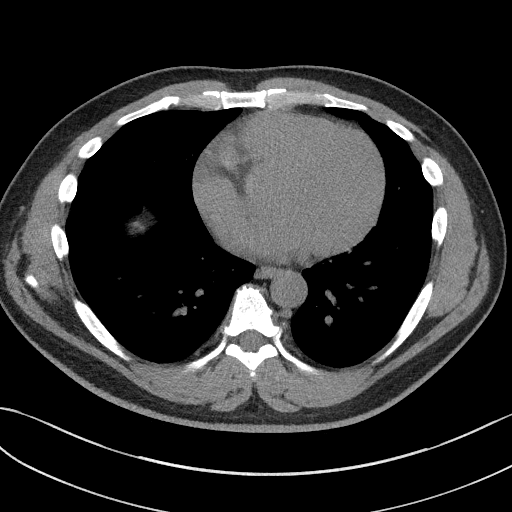
[im 102/110  lung]
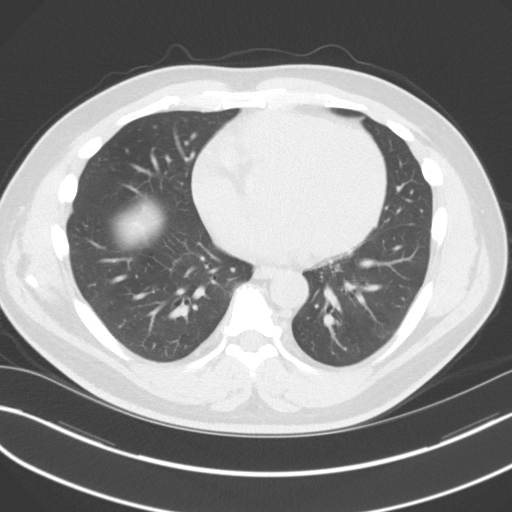
[im 106/110  lung]
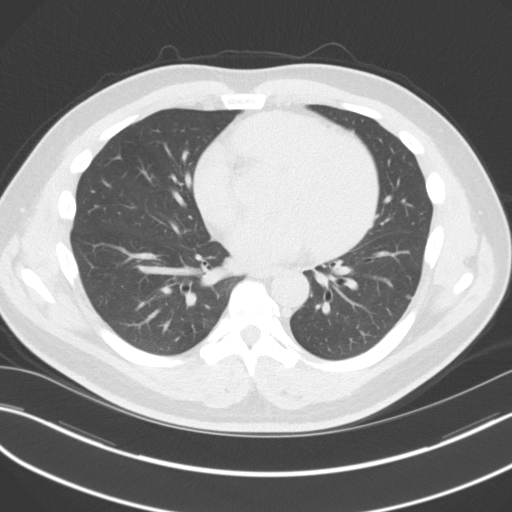

[15 of 32 positions shown; findings below may reference images not displayed]

FINDINGS: Lower chest: Clear lung bases. Normal heart size without pericardial
or pleural effusion.

Hepatobiliary: Mild hepatic steatosis. Normal gallbladder, without
biliary ductal dilatation.

Pancreas: Normal, without mass or ductal dilatation.

Spleen: Normal in size, without focal abnormality.

Adrenals/Urinary Tract: Normal adrenal glands. No renal calculi or
hydronephrosis. No abdominal ureteric stone.

Stomach/Bowel: Normal stomach, without wall thickening. Normal colon
and terminal ileum. Normal abdominal small bowel.

Vascular/Lymphatic: Aortic atherosclerosis. No significant
calcification at the renal artery origins. No retroperitoneal or
retrocrural adenopathy.

Other: No ascites.

Musculoskeletal: No acute osseous abnormality. Mild disc bulge at
L4-5 and L5-S1.
IMPRESSION: 1. No evidence of adrenal mass. No explanation for hypertension or
elevated aldosterone.
2.  Aortic Atherosclerosis ([id number]).
3. Hepatic steatosis.

## 2019-06-01 ENCOUNTER — Other Ambulatory Visit: Payer: Self-pay | Admitting: Endocrinology

## 2019-06-02 ENCOUNTER — Other Ambulatory Visit: Payer: Self-pay | Admitting: Endocrinology

## 2019-06-05 ENCOUNTER — Telehealth: Payer: Self-pay

## 2019-06-05 ENCOUNTER — Other Ambulatory Visit: Payer: Self-pay

## 2019-06-05 MED ORDER — METFORMIN HCL ER 750 MG PO TB24: 1500.0000 mg | ORAL_TABLET | Freq: Every day | ORAL | 5 refills | Status: DC

## 2019-06-05 NOTE — Telephone Encounter (Signed)
Rx sent 

## 2019-06-05 NOTE — Telephone Encounter (Signed)
-----   Message from Elayne Snare, MD sent at 06/03/2019 12:18 PM EST ----- Regarding: Appointment Schedule appointment, overdue for 30-minute visit with labs

## 2019-06-05 NOTE — Telephone Encounter (Signed)
MEDICATION: metFORMIN (GLUCOPHAGE-XR) 750 MG 24 hr tablet  PHARMACY:  [id number] [place], [place] - [address] AT [place] A 90 DAY SUPPLY :   IS PATIENT OUT OF MEDICATION:   IF NOT; HOW MUCH IS LEFT:   LAST APPOINTMENT DATE: @12 /12/2018  NEXT APPOINTMENT DATE:@12 /30/2020  DO WE HAVE YOUR PERMISSION TO LEAVE A DETAILED MESSAGE:  OTHER COMMENTS:    **Let patient know to contact pharmacy at the end of the day to make sure medication is ready. **  ** Please notify patient to allow 48-72 hours to process**  **Encourage patient to contact the pharmacy for refills or they can request refills through [facility]**

## 2019-06-05 NOTE — Telephone Encounter (Signed)
Patient is now scheduled for labs and a visit

## 2019-06-09 ENCOUNTER — Encounter: Payer: Self-pay | Admitting: Internal Medicine

## 2019-06-15 ENCOUNTER — Ambulatory Visit: Payer: Managed Care, Other (non HMO) | Attending: Internal Medicine

## 2019-06-15 ENCOUNTER — Other Ambulatory Visit: Payer: Self-pay

## 2019-06-15 DIAGNOSIS — Z20822 Contact with and (suspected) exposure to covid-19: Secondary | ICD-10-CM

## 2019-06-17 LAB — NOVEL CORONAVIRUS, NAA: SARS-CoV-2, NAA: NOT DETECTED

## 2019-06-19 ENCOUNTER — Other Ambulatory Visit: Payer: Self-pay | Admitting: Internal Medicine

## 2019-06-19 ENCOUNTER — Other Ambulatory Visit: Payer: Self-pay | Admitting: Endocrinology

## 2019-06-19 ENCOUNTER — Encounter: Payer: Self-pay | Admitting: Internal Medicine

## 2019-06-28 ENCOUNTER — Other Ambulatory Visit: Payer: Managed Care, Other (non HMO)

## 2019-07-03 ENCOUNTER — Encounter: Payer: Managed Care, Other (non HMO) | Admitting: Endocrinology

## 2019-07-03 NOTE — Progress Notes (Signed)
This encounter was created in error - please disregard.

## 2019-07-27 ENCOUNTER — Encounter: Payer: Self-pay | Admitting: Internal Medicine

## 2019-07-27 DIAGNOSIS — I10 Essential (primary) hypertension: Secondary | ICD-10-CM

## 2019-07-27 DIAGNOSIS — M542 Cervicalgia: Secondary | ICD-10-CM

## 2019-07-27 DIAGNOSIS — E876 Hypokalemia: Secondary | ICD-10-CM

## 2019-07-27 DIAGNOSIS — E785 Hyperlipidemia, unspecified: Secondary | ICD-10-CM

## 2019-07-27 DIAGNOSIS — E1165 Type 2 diabetes mellitus with hyperglycemia: Secondary | ICD-10-CM

## 2019-07-27 MED ORDER — DEXCOM G6 SENSOR MISC: 1.0000 | 11 refills | Status: DC

## 2019-07-27 MED ORDER — CYCLOBENZAPRINE HCL 5 MG PO TABS: 5.0000 mg | ORAL_TABLET | Freq: Every evening | ORAL | 1 refills | Status: DC | PRN

## 2019-07-27 MED ORDER — METFORMIN HCL ER 750 MG PO TB24: 1500.0000 mg | ORAL_TABLET | Freq: Every day | ORAL | 5 refills | Status: DC

## 2019-07-27 MED ORDER — EZETIMIBE 10 MG PO TABS: 10.0000 mg | ORAL_TABLET | Freq: Every day | ORAL | 3 refills | Status: DC

## 2019-07-27 MED ORDER — SPIRONOLACTONE 50 MG PO TABS: 50.0000 mg | ORAL_TABLET | Freq: Every day | ORAL | 3 refills | Status: DC

## 2019-07-27 MED ORDER — INSULIN LISPRO 100 UNIT/ML ~~LOC~~ SOLN: SUBCUTANEOUS | 3 refills | Status: DC

## 2019-07-27 MED ORDER — DEXCOM G6 RECEIVER DEVI: 1.0000 | 1 refills | Status: DC

## 2019-07-27 MED ORDER — POTASSIUM CHLORIDE ER 10 MEQ PO TBCR: 10.0000 meq | EXTENDED_RELEASE_TABLET | Freq: Every day | ORAL | 3 refills | Status: DC

## 2019-07-27 MED ORDER — OLMESARTAN-AMLODIPINE-HCTZ 40-10-12.5 MG PO TABS: 1.0000 | ORAL_TABLET | Freq: Every day | ORAL | 3 refills | Status: DC

## 2019-07-27 MED ORDER — JARDIANCE 25 MG PO TABS: 25.0000 mg | ORAL_TABLET | Freq: Every day | ORAL | 1 refills | Status: DC

## 2019-07-27 MED ORDER — OMNIPOD DASH PDM (GEN 4) KIT: 1.0000 | PACK | Freq: Every day | 0 refills | Status: DC

## 2019-07-27 MED ORDER — OMNIPOD DASH PODS (GEN 4) MISC: 1.0000 | 2 refills | Status: DC

## 2019-07-27 MED ORDER — ATORVASTATIN CALCIUM 20 MG PO TABS: 20.0000 mg | ORAL_TABLET | Freq: Every day | ORAL | 3 refills | Status: DC

## 2019-07-27 MED ORDER — CARVEDILOL 6.25 MG PO TABS: 6.2500 mg | ORAL_TABLET | Freq: Two times a day (BID) | ORAL | 3 refills | Status: DC

## 2019-08-01 ENCOUNTER — Ambulatory Visit (INDEPENDENT_AMBULATORY_CARE_PROVIDER_SITE_OTHER): Payer: Managed Care, Other (non HMO) | Admitting: Internal Medicine

## 2019-08-01 ENCOUNTER — Encounter: Payer: Self-pay | Admitting: Internal Medicine

## 2019-08-01 ENCOUNTER — Telehealth: Payer: Self-pay | Admitting: Internal Medicine

## 2019-08-01 ENCOUNTER — Other Ambulatory Visit: Payer: Self-pay

## 2019-08-01 VITALS — BP 120/95 | Ht 72.0 in | Wt 185.0 lb

## 2019-08-01 DIAGNOSIS — M791 Myalgia, unspecified site: Secondary | ICD-10-CM

## 2019-08-01 DIAGNOSIS — Z0184 Encounter for antibody response examination: Secondary | ICD-10-CM

## 2019-08-01 DIAGNOSIS — Z1329 Encounter for screening for other suspected endocrine disorder: Secondary | ICD-10-CM

## 2019-08-01 DIAGNOSIS — Z794 Long term (current) use of insulin: Secondary | ICD-10-CM

## 2019-08-01 DIAGNOSIS — H409 Unspecified glaucoma: Secondary | ICD-10-CM | POA: Diagnosis not present

## 2019-08-01 DIAGNOSIS — Z125 Encounter for screening for malignant neoplasm of prostate: Secondary | ICD-10-CM

## 2019-08-01 DIAGNOSIS — M1990 Unspecified osteoarthritis, unspecified site: Secondary | ICD-10-CM

## 2019-08-01 DIAGNOSIS — E1165 Type 2 diabetes mellitus with hyperglycemia: Secondary | ICD-10-CM

## 2019-08-01 DIAGNOSIS — Z1389 Encounter for screening for other disorder: Secondary | ICD-10-CM

## 2019-08-01 DIAGNOSIS — Z1159 Encounter for screening for other viral diseases: Secondary | ICD-10-CM

## 2019-08-01 DIAGNOSIS — K760 Fatty (change of) liver, not elsewhere classified: Secondary | ICD-10-CM

## 2019-08-01 DIAGNOSIS — E782 Mixed hyperlipidemia: Secondary | ICD-10-CM | POA: Diagnosis not present

## 2019-08-01 DIAGNOSIS — I152 Hypertension secondary to endocrine disorders: Secondary | ICD-10-CM | POA: Diagnosis not present

## 2019-08-01 NOTE — Telephone Encounter (Signed)
Fax release of info where pt doesn't have to sign form to [facility] [employer] in [place] phone # [phone]  Need all records I.e. notes   Let me know when this is done fill out form and fax to above office and let me know   Thanks tMS

## 2019-08-01 NOTE — Progress Notes (Signed)
Virtual Visit via Video Note  I connected with Craig Johnson  on 08/01/19 at 11:42 AM EST by a video enabled telemedicine application and verified that I am speaking with the correct person using two identifiers.  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 The patient expressed understanding and agreed to proceed.   HPI: 1. DM 2 A1C 7.1 01/18/19 on insulin pump humalog, not taking jardiance 25 x 2 weeks due to cost   He will do repeat fasting labs this week at [facility]   2. Post covid dx'ed 06/04/2019 doing better he had body aches and fatigue disc he will need to wait 90 days before getting vaccine if interested but h/o shrimp/lobster allergy but not other shellfish or fish and we disc risk of possibly getting covid 19 vaccine today with shellfish allergy   3. HLd he reports he is on zetia and lipitor 20 but not sure the name of medications    4. HTN BP running 120s-130s/80s-90s per pt on coreg 6.25 mg bid, olmesartan norvasc hctz 40-10/12.5, K 10 meq and spironlactone 50 mg qd   5. Neck pain with 03/09/2019 Xray. Denies further numbness/tingling down arms and did not do MRI C spine 2/2 cost and now symptoms improved   FINDINGS: Seven cervical segments are well visualized. Vertebral body height is well maintained. Osteophytic changes are noted from C4-C7. No prevertebral soft tissue changes are noted. No significant neural foraminal changes are noted. The odontoid is within normal limits.  IMPRESSION: Multilevel degenerative change without acute abnormality.   ROS: See pertinent positives and negatives per HPI.  Past Medical History:  Diagnosis Date  . Anxiety   . COVID-19 virus detected    06/04/2019   . Depression   . Diabetes mellitus without complication  (HCC)    Type 2  . DKA (diabetic ketoacidoses) ([place])   . Heart murmur   . Hypertension   . Sleep apnea     No past surgical history on file.  Family History  Problem Relation Age of Onset  . Diabetes Father   . Hypertension Father   . Diabetes Brother   . Hypertension Mother   . Hypertension Maternal Grandmother     SOCIAL HX:  Works [employer]  Married    Current Outpatient Medications:  .  atorvastatin (LIPITOR) 20 MG tablet, Take 1 tablet (20 mg total) by mouth daily at 6 PM. Informed pt to reduce dose cut 40 mg in 1/2=20 mg new Rx will be 20 mg do not cut in 1/2, Disp: 90 tablet, Rfl: 3 .  carvedilol (COREG) 6.25 MG tablet, Take 1 tablet (6.25 mg total) by mouth 2 (two) times daily with a meal., Disp: 180 tablet, Rfl: 3 .  Continuous Blood Gluc Receiver ([place]) DEVI, 1 each by Does not apply route See admin instructions. Use reader to monitor blood glucose continuously., Disp: 1 each, Rfl: 1 .  Continuous Blood Gluc Sensor (DEXCOM G6 SENSOR) MISC, 1 each by Does not apply route See admin instructions. Apply 1 sensor to body once every 10 days to monitor blood glucose., Disp: 3 each, Rfl: 11 .  Continuous Blood Gluc Transmit (DEXCOM G6 TRANSMITTER) MISC, USE 1 TRANSMITTER ONCE EVERY 90 DAYS IN CONJUNCTION WITH DEXCOM G6 SENSOR AND READER., Disp: 1 each, Rfl: 4 .  empagliflozin (JARDIANCE)  25 MG TABS tablet, Take 25 mg by mouth daily., Disp: 30 tablet, Rfl: 1 .  ezetimibe (ZETIA) 10 MG tablet, Take 1 tablet (10 mg total) by mouth daily., Disp: 90 tablet, Rfl: 3 .  Insulin Disposable Pump (OMNIPOD DASH 5 PACK PODS) MISC, Inject 1 each into the skin every 3 (three) days., Disp: 15 each, Rfl: 2 .  Insulin Disposable Pump (OMNIPOD DASH SYSTEM) KIT, 1 each by Does not apply route daily. Use Omnipod PDM to administer insulin daily., Disp: 1 kit, Rfl: 0 .  insulin lispro (HUMALOG) 100 UNIT/ML injection, USE MAX OF 80 UNITS PER DAY VIA OMNIPOD INSULIN PUMP., Disp: 30 mL, Rfl: 3 .   metFORMIN (GLUCOPHAGE-XR) 750 MG 24 hr tablet, Take 2 tablets (1,500 mg total) by mouth daily with breakfast., Disp: 60 tablet, Rfl: 5 .  Olmesartan-amLODIPine-HCTZ 40-10-12.5 MG TABS, Take 1 tablet by mouth daily. In am, Disp: 90 tablet, Rfl: 3 .  potassium chloride (KLOR-CON) 10 MEQ tablet, Take 1 tablet (10 mEq total) by mouth daily., Disp: 90 tablet, Rfl: 3 .  spironolactone (ALDACTONE) 50 MG tablet, Take 1 tablet (50 mg total) by mouth daily. In am, Disp: 90 tablet, Rfl: 3  EXAM:  VITALS per patient if applicable:  GENERAL: alert, oriented, appears well and in no acute distress  HEENT: atraumatic, conjunttiva clear, no obvious abnormalities on inspection of external nose and ears  NECK: normal movements of the head and neck  LUNGS: on inspection no signs of respiratory distress, breathing rate appears normal, no obvious gross SOB, gasping or wheezing  CV: no obvious cyanosis  MS: moves all visible extremities without noticeable abnormality  PSYCH/NEURO: pleasant and cooperative, no obvious depression or anxiety, speech and thought processing grossly intact  ASSESSMENT AND PLAN:  Discussed the following assessment and plan:  Type 2 diabetes mellitus with hyperglycemia, with long-term current use of insulin (HCC) - Plan: Comprehensive metabolic panel, CBC with Differential/Platelet, Lipid panel, Urinalysis, Routine w reflex microscopic, Hemoglobin A1c Off jardiance x 2 weeks 2/2 cost  Sent Dr. Dwyane Dee a message about this and to schedule f/u  Will need foot exam in future with me or Dr. Dwyane Dee also seen by podiatry   Glaucoma of left eye, unspecified glaucoma type F/u [facility] appt 05/2020 ROI sent  F/u sch 08/2019   Hypertension due to endocrine disorder - Plan: Comprehensive metabolic panel, CBC with Differential/Platelet, Lipid panel Cont BP meds stressed compliance goal <130/<80  Mixed hyperlipidemia - Plan: Lipid panel On zetia and lipitor he thinks repeat  lipid panel Check CK  Arthritis, cervical  If pain continues consider MRI cervical and numbness/tingling continues consider emg/ncs to r/o CTS vs cervical radiculopathy   Fatty liver - Plan: Hepatitis A Ab, Total Hep B immune   HM Flu shotutd consider in future in 2021  Consider Tdap and pna 23 vx in future Consider MMR vaccine  Disc covid 19 vx today if he wants he may want to premedicate and have epipen with his mild allergy to shellfish  Hep B immune ho fatty liver check hep A titer  Declines STD check  PSA 04/11/18 0.7 pending PSA Colonoscopy age 52 y.o disc Dr. Alice Reichert today for future referral age 83 y.o  rec healthy diet and exercise  [facility] in [place] on [address] dx'ed 05/2020 +left eye glaucoma and placed on drops and f/u sch 08/2019   -we discussed possible serious and likely etiologies, options for evaluation and workup, limitations of telemedicine visit vs in  person visit, treatment, treatment risks and precautions. Pt prefers to treat via telemedicine empirically rather then risking or undertaking an in person visit at this moment. Patient agrees to seek prompt in person care if worsening, new symptoms arise, or if is not improving with treatment.   I discussed the assessment and treatment plan with the patient. The patient was provided an opportunity to ask questions and all were answered. The patient agreed with the plan and demonstrated an understanding of the instructions.   The patient was advised to call back or seek an in-person evaluation if the symptoms worsen or if the condition fails to improve as anticipated.  Time spent 20-29 minutes  Delorise Jackson, MD

## 2019-08-01 NOTE — Patient Instructions (Addendum)
Read all info below please   Goal blood pressure <130/<80 2 hours after your medications and if you dont have a cuff purchase and upper arm cut adult size automatic from [employer] $25-$35 4-5 star ratings with thousands of ratings any brand   Please take a multivitamin daily for men I.e nature made and vitamin D3 2000 Iu daily   When you turn 49 years old it is recommended you have a screening colonoscopy Dr. Norma Fredrickson at [facility] in [place] is who I discussed with you today, let me know when you are referral for a referral and you will need a covid 19 test before your colonoscopy which his office will coordinate   COVID-19 Vaccine Information can be found at: [email or web address] For questions related to vaccine distribution or appointments, please email [email or web address] or call [phone].   Check fox 8 news online or watch the news for info on how to get the vaccine  In [facility] call [phone] In [facility] call [phone] to schedule covid vaccine   Check with [facility] or local pharmacies getting vaccines mid Feb/early March 2021   You may need to premedicate with an allergy pill I.e benadyl/claritin/zyrtec or allegra before your vaccine  And consider an epipen in case you have a severe reaction    [facility] [address] they open at 7 am daily  -lab orders in    High Cholesterol  High cholesterol is a condition in which the blood has high levels of a white, waxy, fat-like substance (cholesterol). The human body needs small amounts of cholesterol. The liver makes all the cholesterol that the body needs. Extra (excess) cholesterol comes from the food that we eat. Cholesterol is carried from the liver by the blood through the blood vessels. If you have high cholesterol, deposits (plaques) may build up on the walls of your blood vessels (arteries). Plaques make the arteries narrower and  stiffer. Cholesterol plaques increase your risk for heart attack and stroke. Work with your health care provider to keep your cholesterol levels in a healthy range. What increases the risk? This condition is more likely to develop in people who:  Eat foods that are high in animal fat (saturated fat) or cholesterol.  Are overweight.  Are not getting enough exercise.  Have a family history of high cholesterol. What are the signs or symptoms? There are no symptoms of this condition. How is this diagnosed? This condition may be diagnosed from the results of a blood test.  If you are older than age 67, your health care provider may check your cholesterol every 4-6 years.  You may be checked more often if you already have high cholesterol or other risk factors for heart disease. The blood test for cholesterol measures:  "Bad" cholesterol (LDL cholesterol). This is the main type of cholesterol that causes heart disease. The desired level for LDL is less than 100.  "Good" cholesterol (HDL cholesterol). This type helps to protect against heart disease by cleaning the arteries and carrying the LDL away. The desired level for HDL is 60 or higher.  Triglycerides. These are fats that the body can store or burn for energy. The desired number for triglycerides is lower than 150.  Total cholesterol. This is a measure of the total amount of cholesterol in your blood, including LDL cholesterol, HDL cholesterol, and triglycerides. A healthy number is less than 200. How is this treated? This condition is treated with diet changes, lifestyle changes, and medicines. Diet changes  This  may include eating more whole grains, fruits, vegetables, nuts, and fish.  This may also include cutting back on red meat and foods that have a lot of added sugar. Lifestyle changes  Changes may include getting at least 40 minutes of aerobic exercise 3 times a week. Aerobic exercises include walking, biking, and  swimming. Aerobic exercise along with a healthy diet can help you maintain a healthy weight.  Changes may also include quitting smoking. Medicines  Medicines are usually given if diet and lifestyle changes have failed to reduce your cholesterol to healthy levels.  Your health care provider may prescribe a statin medicine. Statin medicines have been shown to reduce cholesterol, which can reduce the risk of heart disease. Follow these instructions at home: Eating and drinking If told by your health care provider:  Eat chicken (without skin), fish, veal, shellfish, ground Malaw[place]urkey breast, and round or loin cuts of red meat.  Do not eat fried foods or fatty meats, such as hot dogs and salami.  Eat plenty of fruits, such as apples.  Eat plenty of vegetables, such as broccoli, potatoes, and carrots.  Eat beans, peas, and lentils.  Eat grains such as barley, rice, couscous, and bulgur wheat.  Eat pasta without cream sauces.  Use skim or nonfat milk, and eat low-fat or nonfat yogurt and cheeses.  Do not eat or drink whole milk, cream, ice cream, egg yolks, or hard cheeses.  Do not eat stick margarine or tub margarines that contain trans fats (also called partially hydrogenated oils).  Do not eat saturated tropical oils, such as coconut oil and palm oil.  Do not eat cakes, cookies, crackers, or other baked goods that contain trans fats.  General instructions  Exercise as directed by your health care provider. Increase your activity level with activities such as gardening, walking, and taking the stairs.  Take over-the-counter and prescription medicines only as told by your health care provider.  Do not use any products that contain nicotine or tobacco, such as cigarettes and e-cigarettes. If you need help quitting, ask your health care provider.  Keep all follow-up visits as told by your health care provider. This is important. Contact a health care provider if:  You are  struggling to maintain a healthy diet or weight.  You need help to start on an exercise program.  You need help to stop smoking. Get help right away if:  You have chest pain.  You have trouble breathing. This information is not intended to replace advice given to you by your health care provider. Make sure you discuss any questions you have with your health care provider. Document Revised: 06/18/2017 Document Reviewed: 12/14/2015 Elsevier Patient Education  2020 [employer]  Cholesterol Content in Foods Cholesterol is a waxy, fat-like substance that helps to carry fat in the blood. The body needs cholesterol in small amounts, but too much cholesterol can cause damage to the arteries and heart. Most people should eat less than 200 milligrams (mg) of cholesterol a day. Foods with cholesterol  Cholesterol is found in animal-based foods, such as meat, seafood, and dairy. Generally, low-fat dairy and lean meats have less cholesterol than full-fat dairy and fatty meats. The milligrams of cholesterol per serving (mg per serving) of common cholesterol-containing foods are listed below. Meat and other proteins  Egg - one large whole egg has 186 mg.  Veal shank - 4 oz has 141 mg.  Lean ground Malaw[place]key (93% lean) - 4 oz has 118 mg.  Fat-trimmed lamb  loin - 4 oz has 106 mg.  Lean ground beef (90% lean) - 4 oz has 100 mg.  Lobster - 3.5 oz has 90 mg.  Pork loin chops - 4 oz has 86 mg.  Canned salmon - 3.5 oz has 83 mg.  Fat-trimmed beef top loin - 4 oz has 78 mg.  Frankfurter - 1 frank (3.5 oz) has 77 mg.  Crab - 3.5 oz has 71 mg.  Roasted chicken without skin, white meat - 4 oz has 66 mg.  Light bologna - 2 oz has 45 mg.  Deli-cut [place] - 2 oz has 31 mg.  Canned tuna - 3.5 oz has 31 mg.  Bacon - 1 oz has 29 mg.  Oysters and mussels (raw) - 3.5 oz has 25 mg.  Mackerel - 1 oz has 22 mg.  Trout - 1 oz has 20 mg.  Pork sausage - 1 link (1 oz) has 17 mg.  Salmon - 1 oz  has 16 mg.  Tilapia - 1 oz has 14 mg. Dairy  Soft-serve ice cream -  cup (4 oz) has 103 mg.  Whole-milk yogurt - 1 cup (8 oz) has 29 mg.  Cheddar cheese - 1 oz has 28 mg.  American cheese - 1 oz has 28 mg.  Whole milk - 1 cup (8 oz) has 23 mg.  2% milk - 1 cup (8 oz) has 18 mg.  Cream cheese - 1 tablespoon (Tbsp) has 15 mg.  Cottage cheese -  cup (4 oz) has 14 mg.  Low-fat (1%) milk - 1 cup (8 oz) has 10 mg.  Sour cream - 1 Tbsp has 8.5 mg.  Low-fat yogurt - 1 cup (8 oz) has 8 mg.  Nonfat Greek yogurt - 1 cup (8 oz) has 7 mg.  Half-and-half cream - 1 Tbsp has 5 mg. Fats and oils  Cod liver oil - 1 tablespoon (Tbsp) has 82 mg.  Butter - 1 Tbsp has 15 mg.  Lard - 1 Tbsp has 14 mg.  Bacon grease - 1 Tbsp has 14 mg.  Mayonnaise - 1 Tbsp has 5-10 mg.  Margarine - 1 Tbsp has 3-10 mg. Exact amounts of cholesterol in these foods may vary depending on specific ingredients and brands. Foods without cholesterol Most plant-based foods do not have cholesterol unless you combine them with a food that has cholesterol. Foods without cholesterol include:  Grains and cereals.  Vegetables.  Fruits.  Vegetable oils, such as olive, canola, and sunflower oil.  Legumes, such as peas, beans, and lentils.  Nuts and seeds.  Egg whites. Summary  The body needs cholesterol in small amounts, but too much cholesterol can cause damage to the arteries and heart.  Most people should eat less than 200 milligrams (mg) of cholesterol a day. This information is not intended to replace advice given to you by your health care provider. Make sure you discuss any questions you have with your health care provider. Document Revised: 05/28/2017 Document Reviewed: 02/09/2017 Elsevier Patient Education  2020 Elsevier Inc.  Diabetes Mellitus and Nutrition, Adult When you have diabetes (diabetes mellitus), it is very important to have healthy eating habits because your blood sugar (glucose)  levels are greatly affected by what you eat and drink. Eating healthy foods in the appropriate amounts, at about the same times every day, can help you:  Control your blood glucose.  Lower your risk of heart disease.  Improve your blood pressure.  Reach or maintain a healthy weight. Every person  with diabetes is different, and each person has different needs for a meal plan. Your health care provider may recommend that you work with a diet and nutrition specialist (dietitian) to make a meal plan that is best for you. Your meal plan may vary depending on factors such as:  The calories you need.  The medicines you take.  Your weight.  Your blood glucose, blood pressure, and cholesterol levels.  Your activity level.  Other health conditions you have, such as heart or kidney disease. How do carbohydrates affect me? Carbohydrates, also called carbs, affect your blood glucose level more than any other type of food. Eating carbs naturally raises the amount of glucose in your blood. Carb counting is a method for keeping track of how many carbs you eat. Counting carbs is important to keep your blood glucose at a healthy level, especially if you use insulin or take certain oral diabetes medicines. It is important to know how many carbs you can safely have in each meal. This is different for every person. Your dietitian can help you calculate how many carbs you should have at each meal and for each snack. Foods that contain carbs include:  Bread, cereal, rice, pasta, and crackers.  Potatoes and corn.  Peas, beans, and lentils.  Milk and yogurt.  Fruit and juice.  Desserts, such as cakes, cookies, ice cream, and candy. How does alcohol affect me? Alcohol can cause a sudden decrease in blood glucose (hypoglycemia), especially if you use insulin or take certain oral diabetes medicines. Hypoglycemia can be a life-threatening condition. Symptoms of hypoglycemia (sleepiness, dizziness, and  confusion) are similar to symptoms of having too much alcohol. If your health care provider says that alcohol is safe for you, follow these guidelines:  Limit alcohol intake to no more than 1 drink per day for nonpregnant women and 2 drinks per day for men. One drink equals 12 oz of beer, 5 oz of wine, or 1 oz of hard liquor.  Do not drink on an empty stomach.  Keep yourself hydrated with water, diet soda, or unsweetened iced tea.  Keep in mind that regular soda, juice, and other mixers may contain a lot of sugar and must be counted as carbs. What are tips for following this plan?  Reading food labels  Start by checking the serving size on the "Nutrition Facts" label of packaged foods and drinks. The amount of calories, carbs, fats, and other nutrients listed on the label is based on one serving of the item. Many items contain more than one serving per package.  Check the total grams (g) of carbs in one serving. You can calculate the number of servings of carbs in one serving by dividing the total carbs by 15. For example, if a food has 30 g of total carbs, it would be equal to 2 servings of carbs.  Check the number of grams (g) of saturated and trans fats in one serving. Choose foods that have low or no amount of these fats.  Check the number of milligrams (mg) of salt (sodium) in one serving. Most people should limit total sodium intake to less than 2,300 mg per day.  Always check the nutrition information of foods labeled as "low-fat" or "nonfat". These foods may be higher in added sugar or refined carbs and should be avoided.  Talk to your dietitian to identify your daily goals for nutrients listed on the label. Shopping  Avoid buying canned, premade, or processed foods. These foods tend  to be high in fat, sodium, and added sugar.  Shop around the outside edge of the grocery store. This includes fresh fruits and vegetables, bulk grains, fresh meats, and fresh dairy. Cooking  Use  low-heat cooking methods, such as baking, instead of high-heat cooking methods like deep frying.  Cook using healthy oils, such as olive, canola, or sunflower oil.  Avoid cooking with butter, cream, or high-fat meats. Meal planning  Eat meals and snacks regularly, preferably at the same times every day. Avoid going long periods of time without eating.  Eat foods high in fiber, such as fresh fruits, vegetables, beans, and whole grains. Talk to your dietitian about how many servings of carbs you can eat at each meal.  Eat 4-6 ounces (oz) of lean protein each day, such as lean meat, chicken, fish, eggs, or tofu. One oz of lean protein is equal to: ? 1 oz of meat, chicken, or fish. ? 1 egg. ?  cup of tofu.  Eat some foods each day that contain healthy fats, such as avocado, nuts, seeds, and fish. Lifestyle  Check your blood glucose regularly.  Exercise regularly as told by your health care provider. This may include: ? 150 minutes of moderate-intensity or vigorous-intensity exercise each week. This could be brisk walking, biking, or water aerobics. ? Stretching and doing strength exercises, such as yoga or weightlifting, at least 2 times a week.  Take medicines as told by your health care provider.  Do not use any products that contain nicotine or tobacco, such as cigarettes and e-cigarettes. If you need help quitting, ask your health care provider.  Work with a Veterinary surgeoncounselor or diabetes educator to identify strategies to manage stress and any emotional and social challenges. Questions to ask a health care provider  Do I need to meet with a diabetes educator?  Do I need to meet with a dietitian?  What number can I call if I have questions?  When are the best times to check my blood glucose? Where to find more information:  American Diabetes Association: diabetes.org  Academy of Nutrition and Dietetics: www.eatright.[employer]  National Institute of Diabetes and Digestive and Kidney  Diseases (NIH): CarFlippers.tnwww.niddk.nih.gov Summary  A healthy meal plan will help you control your blood glucose and maintain a healthy lifestyle.  Working with a diet and nutrition specialist (dietitian) can help you make a meal plan that is best for you.  Keep in mind that carbohydrates (carbs) and alcohol have immediate effects on your blood glucose levels. It is important to count carbs and to use alcohol carefully. This information is not intended to replace advice given to you by your health care provider. Make sure you discuss any questions you have with your health care provider. Document Revised: 05/28/2017 Document Reviewed: 07/20/2016 Elsevier Patient Education  2020 [employer]   Colonoscopy, Adult A colonoscopy is a procedure to look at the entire large intestine. This procedure is done using a long, thin, flexible tube that has a camera on the end. You may have a colonoscopy:  As a part of normal colorectal screening.  If you have certain symptoms, such as: ? A low number of red blood cells in your blood (anemia). ? Diarrhea that does not go away. ? Pain in your abdomen. ? Blood in your stool. A colonoscopy can help screen for and diagnose medical problems, including:  Tumors.  Extra tissue that grows where mucus forms (polyps).  Inflammation.  Areas of bleeding. Tell your health care provider about:  Any allergies you have.  All medicines you are taking, including vitamins, herbs, eye drops, creams, and over-the-counter medicines.  Any problems you or family members have had with anesthetic medicines.  Any blood disorders you have.  Any surgeries you have had.  Any medical conditions you have.  Any problems you have had with having bowel movements.  Whether you are pregnant or may be pregnant. What are the risks? Generally, this is a safe procedure. However, problems may occur, including:  Bleeding.  Damage to your intestine.  Allergic reactions to  medicines given during the procedure.  Infection. This is rare. What happens before the procedure? Eating and drinking restrictions Follow instructions from your health care provider about eating or drinking restrictions, which may include:  A few days before the procedure: ? Follow a low-fiber diet. ? Avoid nuts, seeds, dried fruit, raw fruits, and vegetables.  1-3 days before the procedure: ? Eat only gelatin dessert or ice pops. ? Drink only clear liquids, such as water, clear juice, clear broth or bouillon, black coffee or tea, or clear soft drinks or sports drinks. ? Avoid liquids that contain red or purple dye.  The day of the procedure: ? Do not eat solid foods. You may continue to drink clear liquids until up to 2 hours before the procedure. ? Do not eat or drink anything starting 2 hours before the procedure, or within the time period that your health care provider recommends. Bowel prep If you were prescribed a bowel prep to take by mouth (orally) to clean out your colon:  Take it as told by your health care provider. Starting the day before your procedure, you will need to drink a large amount of liquid medicine. The liquid will cause you to have many bowel movements of loose stool until your stool becomes almost clear or light green.  If your skin or the opening between the buttocks (anus) gets irritated from diarrhea, you may relieve the irritation using: ? Wipes with medicine in them, such as adult wet wipes with aloe and vitamin E. ? A product to soothe skin, such as petroleum jelly.  If you vomit while drinking the bowel prep: ? Take a break for up to 60 minutes. ? Begin the bowel prep again. ? Call your health care provider if you keep vomiting or you cannot take the bowel prep without vomiting.  To clean out your colon, you may also be given: ? Laxative medicines. These help you have a bowel movement. ? Instructions for enema use. An enema is liquid medicine  injected into your rectum. Medicines Ask your health care provider about:  Changing or stopping your regular medicines or supplements. This is especially important if you are taking iron supplements, diabetes medicines, or blood thinners.  Taking medicines such as aspirin and ibuprofen. These medicines can thin your blood. Do not take these medicines unless your health care provider tells you to take them.  Taking over-the-counter medicines, vitamins, herbs, and supplements. General instructions  Ask your health care provider what steps will be taken to help prevent infection. These may include washing skin with a germ-killing soap.  Plan to have someone take you home from the hospital or clinic. What happens during the procedure?   An IV will be inserted into one of your veins.  You may be given one or more of the following: ? A medicine to help you relax (sedative). ? A medicine to numb the area (local anesthetic). ? A medicine to  make you fall asleep (general anesthetic). This is rarely needed.  You will lie on your side with your knees bent.  The tube will: ? Have oil or gel put on it (be lubricated). ? Be inserted into your anus. ? Be gently eased through all parts of your large intestine.  Air will be sent into your colon to keep it open. This may cause some pressure or cramping.  Images will be taken with the camera and will appear on a screen.  A small tissue sample may be removed to be looked at under a microscope (biopsy). The tissue may be sent to a lab for testing if any signs of problems are found.  If small polyps are found, they may be removed and checked for cancer cells.  When the procedure is finished, the tube will be removed. The procedure may vary among health care providers and hospitals. What happens after the procedure?  Your blood pressure, heart rate, breathing rate, and blood oxygen level will be monitored until you leave the hospital or  clinic.  You may have a small amount of blood in your stool.  You may pass gas and have mild cramping or bloating in your abdomen. This is caused by the air that was used to open your colon during the exam.  Do not drive for 24 hours after the procedure.  It is up to you to get the results of your procedure. Ask your health care provider, or the department that is doing the procedure, when your results will be ready. Summary  A colonoscopy is a procedure to look at the entire large intestine.  Follow instructions from your health care provider about eating and drinking before the procedure.  If you were prescribed an oral bowel prep to clean out your colon, take it as told by your health care provider.  During the colonoscopy, a flexible tube with a camera on its end is inserted into the anus and then passed into the other parts of the large intestine. This information is not intended to replace advice given to you by your health care provider. Make sure you discuss any questions you have with your health care provider. Document Revised: 01/06/2019 Document Reviewed: 01/06/2019 Elsevier Patient Education  2020 [employer].

## 2019-08-02 ENCOUNTER — Telehealth: Payer: Self-pay

## 2019-08-02 NOTE — Telephone Encounter (Signed)
Called pt and left voicemail requesting a call back to be scheduled for f/u visit with labs prior to seeing Dr. Lucianne Muss.

## 2019-08-02 NOTE — Telephone Encounter (Signed)
-----   Message from Reather Littler, MD sent at 08/02/2019  8:09 AM EST ----- Regarding: Follow-up Patient overdue for appointment.  Please call to schedule with labs before appointment.  PCP had requested that we call him for appointment.  He needs to know that he cannot no-show again or be dismissed

## 2019-08-03 NOTE — Telephone Encounter (Signed)
I have faxed med release to [facility]. [place].

## 2019-08-15 ENCOUNTER — Other Ambulatory Visit: Payer: Self-pay | Admitting: Internal Medicine

## 2019-08-17 LAB — LIPID PANEL
Chol/HDL Ratio: 2.8 ratio (ref 0.0–5.0)
Cholesterol, Total: 114 mg/dL (ref 100–199)
HDL: 41 mg/dL (ref >39)
LDL Chol Calc (NIH): 45 mg/dL (ref 0–99)
Triglycerides: 166 mg/dL — ABNORMAL HIGH (ref 0–149)
VLDL Cholesterol Cal: 28 mg/dL (ref 5–40)

## 2019-08-17 LAB — CBC WITH DIFFERENTIAL/PLATELET
Basophils Absolute: 0.1 10*3/uL (ref 0.0–0.2)
Basos: 1 %
EOS (ABSOLUTE): 0.5 10*3/uL — ABNORMAL HIGH (ref 0.0–0.4)
Eos: 7 %
Hematocrit: 39.5 % (ref 37.5–51.0)
Hemoglobin: 13.6 g/dL (ref 13.0–17.7)
Immature Grans (Abs): 0 10*3/uL (ref 0.0–0.1)
Immature Granulocytes: 0 %
Lymphocytes Absolute: 2.2 10*3/uL (ref 0.7–3.1)
Lymphs: 31 %
MCH: 30.2 pg (ref 26.6–33.0)
MCHC: 34.4 g/dL (ref 31.5–35.7)
MCV: 88 fL (ref 79–97)
Monocytes Absolute: 0.4 10*3/uL (ref 0.1–0.9)
Monocytes: 6 %
Neutrophils Absolute: 4.1 10*3/uL (ref 1.4–7.0)
Neutrophils: 55 %
Platelets: 203 10*3/uL (ref 150–450)
RBC: 4.5 x10E6/uL (ref 4.14–5.80)
RDW: 12.3 % (ref 11.6–15.4)
WBC: 7.3 10*3/uL (ref 3.4–10.8)

## 2019-08-17 LAB — URINALYSIS, ROUTINE W REFLEX MICROSCOPIC
Bilirubin, UA: NEGATIVE
Glucose, UA: NEGATIVE
Ketones, UA: NEGATIVE
Leukocytes,UA: NEGATIVE
Nitrite, UA: NEGATIVE
Protein,UA: NEGATIVE
RBC, UA: NEGATIVE
Specific Gravity, UA: 1.022 (ref 1.005–1.030)
Urobilinogen, Ur: 0.2 mg/dL (ref 0.2–1.0)
pH, UA: 5.5 (ref 5.0–7.5)

## 2019-08-17 LAB — HEMOGLOBIN A1C
Est. average glucose Bld gHb Est-mCnc: 157 mg/dL
Hgb A1c MFr Bld: 7.1 % — ABNORMAL HIGH (ref 4.8–5.6)

## 2019-08-17 LAB — CK: Total CK: 155 U/L (ref 49–439)

## 2019-08-17 LAB — COMPREHENSIVE METABOLIC PANEL
ALT: 33 IU/L (ref 0–44)
AST: 24 IU/L (ref 0–40)
Albumin/Globulin Ratio: 1.8 (ref 1.2–2.2)
Albumin: 4.7 g/dL (ref 4.0–5.0)
Alkaline Phosphatase: 63 IU/L (ref 39–117)
BUN/Creatinine Ratio: 16 (ref 9–20)
BUN: 15 mg/dL (ref 6–24)
Bilirubin Total: 0.4 mg/dL (ref 0.0–1.2)
CO2: 22 mmol/L (ref 20–29)
Calcium: 9.4 mg/dL (ref 8.7–10.2)
Chloride: 103 mmol/L (ref 96–106)
Creatinine, Ser: 0.93 mg/dL (ref 0.76–1.27)
GFR calc Af Amer: 111 mL/min/{1.73_m2} (ref >59)
GFR calc non Af Amer: 96 mL/min/{1.73_m2} (ref >59)
Globulin, Total: 2.6 g/dL (ref 1.5–4.5)
Glucose: 138 mg/dL — ABNORMAL HIGH (ref 65–99)
Potassium: 4.1 mmol/L (ref 3.5–5.2)
Sodium: 141 mmol/L (ref 134–144)
Total Protein: 7.3 g/dL (ref 6.0–8.5)

## 2019-08-17 LAB — TSH: TSH: 2.66 u[IU]/mL (ref 0.450–4.500)

## 2019-08-17 LAB — HEPATITIS A ANTIBODY, TOTAL: hep A Total Ab: NEGATIVE

## 2019-08-17 LAB — PSA: Prostate Specific Ag, Serum: 0.8 ng/mL (ref 0.0–4.0)

## 2019-08-17 NOTE — Telephone Encounter (Signed)
LMTCB to schedule also sending mychart message

## 2019-08-22 ENCOUNTER — Ambulatory Visit (INDEPENDENT_AMBULATORY_CARE_PROVIDER_SITE_OTHER): Payer: Managed Care, Other (non HMO)

## 2019-08-22 ENCOUNTER — Other Ambulatory Visit: Payer: Self-pay

## 2019-08-22 DIAGNOSIS — Z23 Encounter for immunization: Secondary | ICD-10-CM

## 2019-08-22 NOTE — Progress Notes (Signed)
Patient presented for Hep A injection to left deltoid, patient voiced no concerns nor showed any signs of distress during injection.

## 2019-09-06 ENCOUNTER — Other Ambulatory Visit: Payer: Self-pay

## 2019-09-06 ENCOUNTER — Encounter: Payer: Self-pay | Admitting: Endocrinology

## 2019-09-06 ENCOUNTER — Ambulatory Visit (INDEPENDENT_AMBULATORY_CARE_PROVIDER_SITE_OTHER): Payer: Managed Care, Other (non HMO) | Admitting: Endocrinology

## 2019-09-06 VITALS — BP 124/70 | HR 80 | Ht 72.0 in | Wt 190.6 lb

## 2019-09-06 DIAGNOSIS — E1165 Type 2 diabetes mellitus with hyperglycemia: Secondary | ICD-10-CM | POA: Diagnosis not present

## 2019-09-06 DIAGNOSIS — I10 Essential (primary) hypertension: Secondary | ICD-10-CM

## 2019-09-06 DIAGNOSIS — Z794 Long term (current) use of insulin: Secondary | ICD-10-CM | POA: Diagnosis not present

## 2019-09-06 MED ORDER — FARXIGA 10 MG PO TABS: 10.0000 mg | ORAL_TABLET | Freq: Every day | ORAL | 2 refills | Status: DC

## 2019-09-06 NOTE — Progress Notes (Signed)
Patient ID: Craig Johnson, male   DOB: 19-Oct-1970, 49 y.o.   MRN: [id number]           Reason for Appointment: Follow-up for Type 2 Diabetes  Referring physician: None  History of Present Illness:          Date of diagnosis of type 2 diabetes mellitus: 2013       Background history:  He probably had significantly high blood sugars at time of diagnosis but details are not available. He thinks he was tried on various oral medications initially and these did not improve his blood sugar (Metformin, Januvia, Farxiga) Probably was started on insulin about 3 months after diagnosis Apparently was on as much as 60 units of Lantus daily.   He was started on an insulin pump with the OMNIPOD on 05/20/15  Recent history:   Oral hypoglycemic drugs the patient is taking are: , metformin ER 1500 mg daily       Omnipod PUMP settings :   Basal rate = 1.7 until 6 AM , 6 AM - 11 AM = 2.1 , 11 AM- 2 PM = 1.9, 2 PM-7 PM =  1.8 and 7 PM = 2.2   BOLUSES: carbohydrate ratio of 1:7,  Except 1:8 at lunch and correction factor 1:35 with target 120  His A1c is 7.1, previously 6.5-7.3   Current blood sugar patterns, management and problems identified since last visit  He continues to be noncompliant with follow-up and was seen in 7/20  Apparently his insurance did not cover Invokana last year and he was switched to [place]  However now his insurance will not cover Jardiance and he has not taken it for a couple of months  Did not call for replacement  As usual he does not take any boluses for his meals despite reminders to bolus at least for his higher carbohydrate meals such as cereal in the morning  Surprisingly even without Jardiance his blood sugars do not seem to be high recently  Also asked to reduce his basal rate are suspend pump 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 and sometimes blood sugars will get low  He has a few sporadic boluses in the  evenings and only occasionally in the morning  Not clear why his blood sugars are running around 200 for 3 to 4 days late February  Does not do any formal exercise   Side effects from medications have been: None  Compliance with the medical regimen: Improved   Glucose monitoring:  Dexcom   CONTINUOUS GLUCOSE MONITORING RECORD INTERPRETATION    Dates of Recording: 2/25 through 3/10  Sensor description: G6  Results statistics:   CGM use % of time  100  Average and SD  140+/-37  Time in range       86%  % Time Above 180  13  % Time above 250   % Time Below target  0     Glycemic patterns summary: Average blood sugar and any given time of the day ranges between 120 at noon time to 160 between 7-8 AM Has some fluctuation after breakfast and late at night but usually blood sugars are within the target range  Hyperglycemic episodes are occurring usually after breakfast but not consistently and occasionally late at night  Hypoglycemic episodes have not occurred  Overnight periods: Blood sugars are mildly higher around midnight but usually decreases after 2 AM Are averaging about 132  at his waking up time  Preprandial periods: Blood sugars averaging 130 at breakfast, 128 lunch and 130 at dinner  Postprandial periods:   After breakfast: He has some variability with occasional readings going up around 200    After lunch:   Usually blood sugars are only minimally higher After dinner: Blood sugars are generally not rising after is 88 which is at variable times, usually without any boluses, mostly evening meals are around 8-9 PM   Previous data  CGM use % of time  89  Average and SD  132+/-39  Time in range     82   %  % Time Above 180  12  % Time above 250   % Time Below target  5.3     Self-care:    Previous diabetes education: with CDE with pump start Diet: He has variable intake and variable times               Exercise:  Mostly active in the mornings when he  is working  Weight history:   Wt Readings from Last 3 Encounters:  09/06/19 190 lb 9.6 oz (86.5 kg)  08/01/19 185 lb (83.9 kg)  03/24/19 184 lb 1.9 oz (83.5 kg)    Glycemic control:   Lab Results  Component Value Date   HGBA1C 7.1 (H) 08/15/2019   HGBA1C 7.1 (A) 01/18/2019   HGBA1C 7.3 (A) 09/23/2018   Lab Results  Component Value Date   MICROALBUR <0.7 09/22/2018   LDLCALC 45 08/15/2019   CREATININE 0.93 08/15/2019    Other active problems addressed: See review of systems   Allergies as of 09/06/2019      Reactions   Bee Venom Swelling   Shellfish Allergy    Shrimp and lobster swelling but not severe anaphylaxis       Medication List       Accurate as of September 06, 2019  2:39 PM. If you have any questions, ask your nurse or doctor.        atorvastatin 20 MG tablet Commonly known as: LIPITOR Take 1 tablet (20 mg total) by mouth daily at 6 PM. Informed pt to reduce dose cut 40 mg in 1/2=20 mg new Rx will be 20 mg do not cut in 1/2 What changed: additional instructions   carvedilol 6.25 MG tablet Commonly known as: COREG Take 1 tablet (6.25 mg total) by mouth 2 (two) times daily with a meal.   Dexcom G6 Receiver Devi 1 each by Does not apply route See admin instructions. Use reader to monitor blood glucose continuously.   Dexcom G6 Sensor Misc 1 each by Does not apply route See admin instructions. Apply 1 sensor to body once every 10 days to monitor blood glucose.   Dexcom G6 Transmitter Misc USE 1 TRANSMITTER ONCE EVERY 90 DAYS IN CONJUNCTION WITH DEXCOM G6 SENSOR AND READER.   ezetimibe 10 MG tablet Commonly known as: Zetia Take 1 tablet (10 mg total) by mouth daily.   insulin lispro 100 UNIT/ML injection Commonly known as: HumaLOG USE MAX OF 80 UNITS PER DAY VIA OMNIPOD INSULIN PUMP.   Jardiance 25 MG Tabs tablet Generic drug: empagliflozin Take 25 mg by mouth daily.   metFORMIN 750 MG 24 hr tablet Commonly known as: GLUCOPHAGE-XR Take 2  tablets (1,500 mg total) by mouth daily with breakfast.   Olmesartan-amLODIPine-HCTZ 40-10-12.5 MG Tabs Take 1 tablet by mouth daily. In am   [place] 1 each by Does not apply  route daily. Use Omnipod PDM to administer insulin daily.   OmniPod Dash 5 Pack Pods Misc Inject 1 each into the skin every 3 (three) days.   potassium chloride 10 MEQ tablet Commonly known as: KLOR-CON Take 1 tablet (10 mEq total) by mouth daily.   spironolactone 50 MG tablet Commonly known as: ALDACTONE Take 1 tablet (50 mg total) by mouth daily. In am       Allergies:  Allergies  Allergen Reactions  . Bee Venom Swelling  . Shellfish Allergy     Shrimp and lobster swelling but not severe anaphylaxis      Past Medical History:  Diagnosis Date  . Anxiety   . COVID-19 virus detected    06/04/2019   . Depression   . Diabetes mellitus without complication (HCC)    Type 2  . DKA (diabetic ketoacidoses) ([place])   . Heart murmur   . Hypertension   . Sleep apnea     History reviewed. No pertinent surgical history.  Family History  Problem Relation Age of Onset  . Diabetes Father   . Hypertension Father   . Diabetes Brother   . Hypertension Mother   . Hypertension Maternal Grandmother     Social History:  reports that he has never smoked. He has never used smokeless tobacco. He reports current alcohol use. He reports that he does not use drugs.     ROS  Hypertension: This is being managed by his PCP  He had fairly significant hypertension previously resistant to treatment  Currently taking triple therapy with Benicar/amlodipine/HCTZ as well as Coreg; Aldactone 50 mg was  restarted on his visit in 08/2018 Currently on 6.25 mg carvedilol  He does check blood pressure occasionally at home, recent readings 125/85-90  He has had had  hypokalemia with HCTZ and is now on Aldactone Previously trial of clonidine patch was unsuccessful because of local itching.  His  aldosterone/renin activity level was high and the salt loading test showed the 24-hour urine aldosterone to be 20.3 However his CT scan did not show any adrenal abnormality  Recent home blood pressure: 125/75   BP Readings from Last 3 Encounters:  09/06/19 124/70  08/01/19 (!) 120/95  03/24/19 128/80   Lab Results  Component Value Date   CREATININE 0.93 08/15/2019   BUN 15 08/15/2019   NA 141 08/15/2019   K 4.1 08/15/2019   CL 103 08/15/2019   CO2 22 08/15/2019     Lipid history: Has been on 20 mg Lipitor as a statin drug along with Zetia from his PCP LDL is still below 50  He was previously taking 145 mg of fenofibrate and his last triglycerides were still high, now triglycerides appear to be better  Lab Results  Component Value Date   CHOL 114 08/15/2019   HDL 41 08/15/2019   LDLCALC 45 08/15/2019   LDLDIRECT 48.0 09/22/2018   TRIG 166 (H) 08/15/2019   CHOLHDL 2.8 08/15/2019    Most recent eye exam was 03/2019, he thinks he has been followed for glaucoma   Physical Examination:  BP 124/70 (BP Location: Left Arm, Patient Position: Sitting, Cuff Size: Normal)   Pulse 80   Ht 6' (1.829 m)   Wt 190 lb 9.6 oz (86.5 kg)   SpO2 98%   BMI 25.85 kg/m   Diabetic Foot Exam - Simple   Simple Foot Form Diabetic Foot exam was performed with the following findings: Yes   Visual Inspection No deformities, no ulcerations, no other  skin breakdown bilaterally: Yes Sensation Testing Intact to touch and monofilament testing bilaterally: Yes Pulse Check Posterior Tibialis and Dorsalis pulse intact bilaterally: Yes Comments       ASSESSMENT:  Diabetes type 2, insulin-dependent, treated with Omni pod insulin pump See history of present illness for detailed discussion of current diabetes management, blood sugar patterns and problems identified  Blood sugar control is appearing to be improving with the insulin pump  A1c is 7.1 last month  He is appearing to be well  managed with mostly basal insulin using his pump He does occasionally take boluses but not frequently and mostly when blood sugars are higher He is not able to judge when he needs to take a bolus for his meals if he is eating more carbohydrates or higher fat meals Most of the time blood sugars are higher if eating cereal in the morning which he forgets to cover frequently Also may have tendency to low normal sugars when starting work in the morning and does not reduce his basal rate for the next  Discussed that with using the Dexcom we should be able to predict when his blood sugars are going relatively higher and to bolus him time Also discussed timing of boluses before meals for higher fat and higher carbohydrate meals  Although he has not taken Jardiance he still would benefit long-term from renal and cardiovascular benefits Currently [place] is covered  HYPERTENSION:   This is now well controlled consistently and he monitors at home also  Has been followed by PCP Also potassium has been normal with Aldactone    HYPERLIPIDEMIA: Triglycerides are improving, may be from better diet and better diabetes control, not on any specific treatment for this LDL rather low with 2 drug treatment  PLAN:    He will try to bolus as discussed above  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     Elayne Snare 09/06/2019, 2:39 PM   Note: This office note was prepared with Dragon voice recognition system technology. Any transcriptional errors that result from this process are unintentional.

## 2019-09-06 NOTE — Patient Instructions (Addendum)
Reduce Basal to 25% of normal before getting active  Cover cereal with bolus

## 2019-10-18 ENCOUNTER — Encounter: Payer: Self-pay | Admitting: Internal Medicine

## 2019-12-01 ENCOUNTER — Other Ambulatory Visit: Payer: Self-pay

## 2019-12-05 ENCOUNTER — Other Ambulatory Visit: Payer: Self-pay

## 2019-12-05 ENCOUNTER — Ambulatory Visit (INDEPENDENT_AMBULATORY_CARE_PROVIDER_SITE_OTHER): Payer: Managed Care, Other (non HMO) | Admitting: Internal Medicine

## 2019-12-05 ENCOUNTER — Encounter: Payer: Self-pay | Admitting: Internal Medicine

## 2019-12-05 ENCOUNTER — Telehealth: Payer: Self-pay | Admitting: Internal Medicine

## 2019-12-05 VITALS — BP 118/78 | HR 51 | Temp 97.8°F | Ht 72.01 in | Wt 189.6 lb

## 2019-12-05 DIAGNOSIS — E1165 Type 2 diabetes mellitus with hyperglycemia: Secondary | ICD-10-CM | POA: Diagnosis not present

## 2019-12-05 DIAGNOSIS — I10 Essential (primary) hypertension: Secondary | ICD-10-CM

## 2019-12-05 DIAGNOSIS — Z794 Long term (current) use of insulin: Secondary | ICD-10-CM

## 2019-12-05 DIAGNOSIS — Z23 Encounter for immunization: Secondary | ICD-10-CM | POA: Diagnosis not present

## 2019-12-05 DIAGNOSIS — E1159 Type 2 diabetes mellitus with other circulatory complications: Secondary | ICD-10-CM | POA: Diagnosis not present

## 2019-12-05 DIAGNOSIS — Z1211 Encounter for screening for malignant neoplasm of colon: Secondary | ICD-10-CM

## 2019-12-05 DIAGNOSIS — E559 Vitamin D deficiency, unspecified: Secondary | ICD-10-CM

## 2019-12-05 LAB — MICROALBUMIN / CREATININE URINE RATIO
Creatinine,U: 49.1 mg/dL
Microalb Creat Ratio: 1.4 mg/g (ref 0.0–30.0)
Microalb, Ur: <0.7 mg/dL (ref 0.0–1.9)

## 2019-12-05 MED ORDER — TETANUS-DIPHTH-ACELL PERTUSSIS 5-2.5-18.5 LF-MCG/0.5 IM SUSP: 0.5000 mL | Freq: Once | INTRAMUSCULAR | 0 refills | Status: AC

## 2019-12-05 NOTE — Telephone Encounter (Signed)
Call pt should be taking farxiga 10 or Jardiance 25 which one is he taking?

## 2019-12-05 NOTE — Patient Instructions (Addendum)
Results for Craig Johnson, Craig Johnson (MRN [id number]) as of 12/05/2019 11:39  Ref. Range 09/22/2018 16:20 08/15/2019 00:00  Cholesterol Latest Ref Range: 0 - 200 mg/dL 734   Cholesterol, Total Latest Ref Range: 100 - 199 mg/dL  193  HDL Cholesterol Latest Ref Range: >39 mg/dL 79.02 41  Direct LDL Latest Units: mg/dL 40.9   MICROALB/CREAT RATIO Latest Ref Range: 0.0 - 30.0 mg/g 0.8   Triglycerides Latest Ref Range: 0 - 149 mg/dL 735.3 Triglyceride is over 400; calculations on Lipids are invalid. (H) 166 (H)  VLDL Cholesterol Cal Latest Ref Range: 5 - 40 mg/dL  28  LDL Chol Calc (NIH) Latest Ref Range: 0 - 99 mg/dL  45    Consider pfizer x 2 doses   COVID-19 Vaccine Information can be found at: [email or web address] For questions related to vaccine distribution or appointments, please email [email or web address] or call [phone].     Colonoscopy, Adult A colonoscopy is a procedure to look at the entire large intestine. This procedure is done using a long, thin, flexible tube that has a camera on the end. You may have a colonoscopy:  As a part of normal colorectal screening.  If you have certain symptoms, such as: ? A low number of red blood cells in your blood (anemia). ? Diarrhea that does not go away. ? Pain in your abdomen. ? Blood in your stool. A colonoscopy can help screen for and diagnose medical problems, including:  Tumors.  Extra tissue that grows where mucus forms (polyps).  Inflammation.  Areas of bleeding. Tell your health care provider about:  Any allergies you have.  All medicines you are taking, including vitamins, herbs, eye drops, creams, and over-the-counter medicines.  Any problems you or family members have had with anesthetic medicines.  Any blood disorders you have.  Any surgeries you have had.  Any medical conditions you have.  Any problems you have had with having bowel movements.  Whether  you are pregnant or may be pregnant. What are the risks? Generally, this is a safe procedure. However, problems may occur, including:  Bleeding.  Damage to your intestine.  Allergic reactions to medicines given during the procedure.  Infection. This is rare. What happens before the procedure? Eating and drinking restrictions Follow instructions from your health care provider about eating or drinking restrictions, which may include:  A few days before the procedure: ? Follow a low-fiber diet. ? Avoid nuts, seeds, dried fruit, raw fruits, and vegetables.  1-3 days before the procedure: ? Eat only gelatin dessert or ice pops. ? Drink only clear liquids, such as water, clear juice, clear broth or bouillon, black coffee or tea, or clear soft drinks or sports drinks. ? Avoid liquids that contain red or purple dye.  The day of the procedure: ? Do not eat solid foods. You may continue to drink clear liquids until up to 2 hours before the procedure. ? Do not eat or drink anything starting 2 hours before the procedure, or within the time period that your health care provider recommends. Bowel prep If you were prescribed a bowel prep to take by mouth (orally) to clean out your colon:  Take it as told by your health care provider. Starting the day before your procedure, you will need to drink a large amount of liquid medicine. The liquid will cause you to have many bowel movements of loose stool until your stool becomes almost clear or light green.  If your skin or the opening between the buttocks (anus)  gets irritated from diarrhea, you may relieve the irritation using: ? Wipes with medicine in them, such as adult wet wipes with aloe and vitamin E. ? A product to soothe skin, such as petroleum jelly.  If you vomit while drinking the bowel prep: ? Take a break for up to 60 minutes. ? Begin the bowel prep again. ? Call your health care provider if you keep vomiting or you cannot take the  bowel prep without vomiting.  To clean out your colon, you may also be given: ? Laxative medicines. These help you have a bowel movement. ? Instructions for enema use. An enema is liquid medicine injected into your rectum. Medicines Ask your health care provider about:  Changing or stopping your regular medicines or supplements. This is especially important if you are taking iron supplements, diabetes medicines, or blood thinners.  Taking medicines such as aspirin and ibuprofen. These medicines can thin your blood. Do not take these medicines unless your health care provider tells you to take them.  Taking over-the-counter medicines, vitamins, herbs, and supplements. General instructions  Ask your health care provider what steps will be taken to help prevent infection. These may include washing skin with a germ-killing soap.  Plan to have someone take you home from the hospital or clinic. What happens during the procedure?   An IV will be inserted into one of your veins.  You may be given one or more of the following: ? A medicine to help you relax (sedative). ? A medicine to numb the area (local anesthetic). ? A medicine to make you fall asleep (general anesthetic). This is rarely needed.  You will lie on your side with your knees bent.  The tube will: ? Have oil or gel put on it (be lubricated). ? Be inserted into your anus. ? Be gently eased through all parts of your large intestine.  Air will be sent into your colon to keep it open. This may cause some pressure or cramping.  Images will be taken with the camera and will appear on a screen.  A small tissue sample may be removed to be looked at under a microscope (biopsy). The tissue may be sent to a lab for testing if any signs of problems are found.  If small polyps are found, they may be removed and checked for cancer cells.  When the procedure is finished, the tube will be removed. The procedure may vary among health  care providers and hospitals. What happens after the procedure?  Your blood pressure, heart rate, breathing rate, and blood oxygen level will be monitored until you leave the hospital or clinic.  You may have a small amount of blood in your stool.  You may pass gas and have mild cramping or bloating in your abdomen. This is caused by the air that was used to open your colon during the exam.  Do not drive for 24 hours after the procedure.  It is up to you to get the results of your procedure. Ask your health care provider, or the department that is doing the procedure, when your results will be ready. Summary  A colonoscopy is a procedure to look at the entire large intestine.  Follow instructions from your health care provider about eating and drinking before the procedure.  If you were prescribed an oral bowel prep to clean out your colon, take it as told by your health care provider.  During the colonoscopy, a flexible tube with a camera on  its end is inserted into the anus and then passed into the other parts of the large intestine. This information is not intended to replace advice given to you by your health care provider. Make sure you discuss any questions you have with your health care provider. Document Revised: 01/06/2019 Document Reviewed: 01/06/2019 Elsevier Patient Education  [place].

## 2019-12-05 NOTE — Addendum Note (Signed)
Addended by: Warden Fillers on: 12/05/2019 01:10 PM   Modules accepted: Orders

## 2019-12-05 NOTE — Progress Notes (Signed)
Chief Complaint  Patient presents with  . Follow-up   F/u  1. DM2 A1C 7.1 being managed Dr. Dwyane Dee and f/u sch 12/2019 will have fasting labs before appt 7/14. On farxiga 10 vs jardiance 25will confirm which one he is taking, on metformin xr 750, humalog  Missed eye md appt 08/2019 pt to call and reschedule  2. HLD improved on lipitor 20 and zetia 10 triglycerides improved will check fasting labs [facility] before 01/10/20  3. Colonoscopy due agreeable  4. HTN controlled today taking coreg 6.25 qd encouraged bid, olmesartan norvasc hctz 40-10/12.5 and spironlactone 50 mg qd  Review of Systems  Constitutional: Negative for weight loss.  HENT: Negative for hearing loss.   Eyes: Negative for blurred vision.  Respiratory: Negative for shortness of breath.   Cardiovascular: Negative for chest pain.  Gastrointestinal: Negative for abdominal pain.  Musculoskeletal: Negative for neck pain.  Skin: Negative for rash.  Neurological: Negative for headaches.  Psychiatric/Behavioral: Negative for depression.   Past Medical History:  Diagnosis Date  . Anxiety   . COVID-19 virus detected    06/04/2019   . Depression   . Diabetes mellitus without complication (HCC)    Type 2  . DKA (diabetic ketoacidoses) ([place])   . Heart murmur   . Hypertension   . Sleep apnea    No past surgical history on file. Family History  Problem Relation Age of Onset  . Diabetes Father   . Hypertension Father   . Diabetes Brother   . Hypertension Mother   . Hypertension Maternal Grandmother    Social History   Socioeconomic History  . Marital status: Married    Spouse name: Not on file  . Number of children: Not on file  . Years of education: Not on file  . Highest education level: Not on file  Occupational History  . Not on file  Tobacco Use  . Smoking status: Never Smoker  . Smokeless tobacco: Never Used  Substance and Sexual Activity  . Alcohol use: Yes  . Drug use: No  . Sexual activity: Not on file   Other Topics Concern  . Not on file  Social History Narrative   Works [employer]    Married    Social Determinants of Radio broadcast assistant Strain:   . Difficulty of Paying Living Expenses:   Food Insecurity:   . Worried About Charity fundraiser in the Last Year:   . Arboriculturist in the Last Year:   Transportation Needs:   . Film/video editor (Medical):   Marland Kitchen Lack of Transportation (Non-Medical):   Physical Activity:   . Days of Exercise per Week:   . Minutes of Exercise per Session:   Stress:   . Feeling of Stress :   Social Connections:   . Frequency of Communication with Friends and Family:   . Frequency of Social Gatherings with Friends and Family:   . Attends Religious Services:   . Active Member of Clubs or Organizations:   . Attends Archivist Meetings:   Marland Kitchen Marital Status:   Intimate Partner Violence:   . Fear of Current or Ex-Partner:   . Emotionally Abused:   Marland Kitchen Physically Abused:   . Sexually Abused:    Current Meds  Medication Sig  . atorvastatin (LIPITOR) 20 MG tablet Take 1 tablet (20 mg total) by mouth daily at 6 PM. Informed pt to reduce dose cut 40 mg in 1/2=20 mg new Rx will be  20 mg do not cut in 1/2 (Patient taking differently: Take 20 mg by mouth daily at 6 PM. Take 1 tablet by mouth once daily.)  . carvedilol (COREG) 6.25 MG tablet Take 1 tablet (6.25 mg total) by mouth 2 (two) times daily with a meal.  . Continuous Blood Gluc Receiver ([place]) DEVI 1 each by Does not apply route See admin instructions. Use reader to monitor blood glucose continuously.  . Continuous Blood Gluc Sensor (DEXCOM G6 SENSOR) MISC 1 each by Does not apply route See admin instructions. Apply 1 sensor to body once every 10 days to monitor blood glucose.  . Continuous Blood Gluc Transmit (DEXCOM G6 TRANSMITTER) MISC USE 1 TRANSMITTER ONCE EVERY 90 DAYS IN CONJUNCTION WITH DEXCOM G6 SENSOR AND READER.  . dapagliflozin propanediol (FARXIGA) 10 MG TABS  tablet Take 10 mg by mouth daily.  . empagliflozin (JARDIANCE) 25 MG TABS tablet Take 25 mg by mouth daily.  Marland Kitchen ezetimibe (ZETIA) 10 MG tablet Take 1 tablet (10 mg total) by mouth daily.  . Insulin Disposable Pump (OMNIPOD DASH 5 PACK PODS) MISC Inject 1 each into the skin every 3 (three) days.  . Insulin Disposable Pump (OMNIPOD DASH SYSTEM) KIT 1 each by Does not apply route daily. Use Omnipod PDM to administer insulin daily.  . insulin lispro (HUMALOG) 100 UNIT/ML injection USE MAX OF 80 UNITS PER DAY VIA OMNIPOD INSULIN PUMP.  . metFORMIN (GLUCOPHAGE-XR) 750 MG 24 hr tablet Take 2 tablets (1,500 mg total) by mouth daily with breakfast.  . Olmesartan-amLODIPine-HCTZ 40-10-12.5 MG TABS Take 1 tablet by mouth daily. In am  . potassium chloride (KLOR-CON) 10 MEQ tablet Take 1 tablet (10 mEq total) by mouth daily.  Marland Kitchen spironolactone (ALDACTONE) 50 MG tablet Take 1 tablet (50 mg total) by mouth daily. In am   Allergies  Allergen Reactions  . Bee Venom Swelling  . Shellfish Allergy     Shrimp and lobster swelling but not severe anaphylaxis     No results found for this or any previous visit (from the past 2160 hour(s)). Objective  Body mass index is 25.71 kg/m. Wt Readings from Last 3 Encounters:  12/05/19 189 lb 9.6 oz (86 kg)  09/06/19 190 lb 9.6 oz (86.5 kg)  08/01/19 185 lb (83.9 kg)   Temp Readings from Last 3 Encounters:  12/05/19 97.8 F (36.6 C)  03/24/19 (!) 95.9 F (35.5 C) (Temporal)  03/09/19 98.3 F (36.8 C) (Oral)   BP Readings from Last 3 Encounters:  12/05/19 118/78  09/06/19 124/70  08/01/19 (!) 120/95   Pulse Readings from Last 3 Encounters:  12/05/19 (!) 51  09/06/19 80  03/24/19 69    Physical Exam Vitals and nursing note reviewed.  Constitutional:      Appearance: Normal appearance.  HENT:     Head: Normocephalic and atraumatic.  Eyes:     Conjunctiva/sclera: Conjunctivae normal.     Pupils: Pupils are equal, round, and reactive to light.   Cardiovascular:     Rate and Rhythm: Normal rate and regular rhythm.     Heart sounds: No murmur.  Pulmonary:     Effort: Pulmonary effort is normal.     Breath sounds: Normal breath sounds.  Skin:    General: Skin is warm and dry.  Neurological:     General: No focal deficit present.     Mental Status: He is alert and oriented to person, place, and time. Mental status is at baseline.  Psychiatric:  Mood and Affect: Mood normal.        Behavior: Behavior normal.        Thought Content: Thought content normal.        Judgment: Judgment normal.     Assessment  Plan  Type 2 diabetes mellitus with hyperglycemia, with long-term current use of insulin (HCC) - Plan: Hemoglobin A1c, Microalbumin / creatinine urine ratio Cont meds and f/u Dr. Dwyane Dee 01/10/20   Essential hypertension controlled with DM2 - Plan: Comprehensive metabolic panel, Lipid panel, CBC with Differential/Platelet Cont meds  HM-CPE at f/u  Consider HIV/hep C in future  Flu shotutdconsider in futurein 2021  Consider Tdap and pna 23 vx in future -given Tdap today  Hep A 1/2 had  Consider MMR vaccine  Disc covid 19 vx today again if he wants he may want to premedicate and have epipen with his mild allergy to shellfish  Hep B immune ho fatty liver check hep A titer  Declines STD check  PSA 08/15/19 0.8 normal Colonoscopy age 9 y.o disc Dr. Alice Reichert today for future referral age 89 y.o  -referred today rec healthy diet and exercise Eye [facility] in [place] [address] dx'ed 05/2020 +left eye glaucoma and placed on drops and f/u sch 08/2019  Provider: Dr. Olivia Mackie McLean-Scocuzza-Internal Medicine

## 2019-12-06 ENCOUNTER — Other Ambulatory Visit: Payer: Self-pay | Admitting: Internal Medicine

## 2019-12-06 NOTE — Telephone Encounter (Signed)
Patient states he is taking the [place] and not Jardiance.   Med list updated

## 2019-12-11 ENCOUNTER — Other Ambulatory Visit: Payer: Self-pay | Admitting: Endocrinology

## 2020-01-09 ENCOUNTER — Encounter: Payer: Self-pay | Admitting: Endocrinology

## 2020-01-09 LAB — COMPREHENSIVE METABOLIC PANEL
ALT: 40 IU/L (ref 0–44)
AST: 24 IU/L (ref 0–40)
Albumin/Globulin Ratio: 1.5 (ref 1.2–2.2)
Albumin: 4.5 g/dL (ref 4.0–5.0)
Alkaline Phosphatase: 65 IU/L (ref 48–121)
BUN/Creatinine Ratio: 18 (ref 9–20)
BUN: 17 mg/dL (ref 6–24)
Bilirubin Total: 0.3 mg/dL (ref 0.0–1.2)
CO2: 23 mmol/L (ref 20–29)
Calcium: 9.1 mg/dL (ref 8.7–10.2)
Chloride: 105 mmol/L (ref 96–106)
Creatinine, Ser: 0.96 mg/dL (ref 0.76–1.27)
GFR calc Af Amer: 106 mL/min/{1.73_m2} (ref >59)
GFR calc non Af Amer: 92 mL/min/{1.73_m2} (ref >59)
Globulin, Total: 3 g/dL (ref 1.5–4.5)
Glucose: 90 mg/dL (ref 65–99)
Potassium: 4.5 mmol/L (ref 3.5–5.2)
Sodium: 140 mmol/L (ref 134–144)
Total Protein: 7.5 g/dL (ref 6.0–8.5)

## 2020-01-09 LAB — CBC WITH DIFFERENTIAL/PLATELET
Basophils Absolute: 0.1 10*3/uL (ref 0.0–0.2)
Basos: 1 %
EOS (ABSOLUTE): 0.4 10*3/uL (ref 0.0–0.4)
Eos: 8 %
Hematocrit: 37.7 % (ref 37.5–51.0)
Hemoglobin: 13 g/dL (ref 13.0–17.7)
Immature Grans (Abs): 0 10*3/uL (ref 0.0–0.1)
Immature Granulocytes: 0 %
Lymphocytes Absolute: 2.3 10*3/uL (ref 0.7–3.1)
Lymphs: 43 %
MCH: 30.8 pg (ref 26.6–33.0)
MCHC: 34.5 g/dL (ref 31.5–35.7)
MCV: 89 fL (ref 79–97)
Monocytes Absolute: 0.4 10*3/uL (ref 0.1–0.9)
Monocytes: 8 %
Neutrophils Absolute: 2.2 10*3/uL (ref 1.4–7.0)
Neutrophils: 40 %
Platelets: 189 10*3/uL (ref 150–450)
RBC: 4.22 x10E6/uL (ref 4.14–5.80)
RDW: 11.8 % (ref 11.6–15.4)
WBC: 5.4 10*3/uL (ref 3.4–10.8)

## 2020-01-09 LAB — HEMOGLOBIN A1C
Est. average glucose Bld gHb Est-mCnc: 166 mg/dL
Hgb A1c MFr Bld: 7.4 % — ABNORMAL HIGH (ref 4.8–5.6)

## 2020-01-09 LAB — LIPID PANEL
Chol/HDL Ratio: 3.7 ratio (ref 0.0–5.0)
Cholesterol, Total: 161 mg/dL (ref 100–199)
HDL: 43 mg/dL (ref >39)
LDL Chol Calc (NIH): 81 mg/dL (ref 0–99)
Triglycerides: 225 mg/dL — ABNORMAL HIGH (ref 0–149)
VLDL Cholesterol Cal: 37 mg/dL (ref 5–40)

## 2020-01-09 LAB — VITAMIN D 25 HYDROXY (VIT D DEFICIENCY, FRACTURES): Vit D, 25-Hydroxy: 23.9 ng/mL — ABNORMAL LOW (ref 30.0–100.0)

## 2020-01-10 ENCOUNTER — Other Ambulatory Visit: Payer: Self-pay

## 2020-01-10 ENCOUNTER — Ambulatory Visit (INDEPENDENT_AMBULATORY_CARE_PROVIDER_SITE_OTHER): Payer: Managed Care, Other (non HMO) | Admitting: Endocrinology

## 2020-01-10 ENCOUNTER — Encounter: Payer: Self-pay | Admitting: Endocrinology

## 2020-01-10 VITALS — BP 106/76 | HR 81 | Ht 72.0 in | Wt 189.8 lb

## 2020-01-10 DIAGNOSIS — I10 Essential (primary) hypertension: Secondary | ICD-10-CM | POA: Diagnosis not present

## 2020-01-10 DIAGNOSIS — E782 Mixed hyperlipidemia: Secondary | ICD-10-CM

## 2020-01-10 DIAGNOSIS — Z794 Long term (current) use of insulin: Secondary | ICD-10-CM

## 2020-01-10 DIAGNOSIS — E1165 Type 2 diabetes mellitus with hyperglycemia: Secondary | ICD-10-CM

## 2020-01-10 MED ORDER — FENOFIBRATE 145 MG PO TABS
145.0000 mg | ORAL_TABLET | Freq: Every day | ORAL | 2 refills | Status: DC
Start: 2020-01-10 — End: 2020-07-23

## 2020-01-10 NOTE — Patient Instructions (Signed)
D3, 2000 units daily  Bolus for hi carb meals or sugar rising

## 2020-01-10 NOTE — Progress Notes (Signed)
Patient ID: Craig Johnson, male   DOB: 12-29-1970, 49 y.o.   MRN: [id number]           Reason for Appointment: Follow-up   Referring physician: None  History of Present Illness:          Date of diagnosis of type 2 diabetes mellitus: 2013       Background history:  He probably had significantly high blood sugars at time of diagnosis but details are not available. He thinks he was tried on various oral medications initially and these did not improve his blood sugar (Metformin, Januvia, Farxiga) Probably was started on insulin about 3 months after diagnosis Apparently was on as much as 60 units of Lantus daily.   He was started on an insulin pump with the OMNIPOD on 05/20/15  Recent history:   Oral hypoglycemic drugs the patient is taking are: Farxiga 10 mg daily, metformin ER 1500 mg daily       Omnipod PUMP settings :   Basal rate = 1.7 until 6 AM , 6 AM - 11 AM = 2.1 , 11 AM- 2 PM = 1.9, 2 PM-7 PM =  1.8 and 7 PM = 2.2   BOLUSES: carbohydrate ratio of 1:7,  Except 1:8 at lunch and correction factor 1:35 with target 120  Total daily insulin recently 43 units with 90% in basal  His A1c is 7.1, previously 6.5-7.3   Current blood sugar patterns, management and problems identified since last visit  He does not do much coverage of his meals with boluses either because he has low carbohydrate meals are he is afraid of getting low sugars when bolusing at mealtimes at work  [employer] he had low carbohydrate meals and did not do any boluses with only minimal hyperglycemia late morning  Currently only taking 10% of his insulin as boluses  He is now on [place] based on his insurance coverage and he thinks this is not as effective as Jardiance  Overall no consistent pattern seen on his sugars  Although previously would tend to get 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 he does not have this problem and does not use a temporary basal or suspend his pump  As  discussed below his overnight readings are somewhat variable   Side effects from medications have been: None  Compliance with the medical regimen: Improved   CONTINUOUS GLUCOSE MONITORING RECORD INTERPRETATION    Dates of Recording: Last 2 weeks  Sensor description: G6  Results statistics:   CGM use % of time  75  Average and SD  160, +/-39  Time in range  71       % was 86  % Time Above 180  29  % Time above 250   % Time Below target 0    Glycemic patterns summary: Blood sugars are showing moderate variability especially overnight and in the morning hours.  Highest blood sugars are mid afternoons but not consistent.  No hypoglycemia  Hyperglycemic episodes are occurring periodically in the mid afternoons, occasionally after dinner and after breakfast  Hypoglycemic episodes occurred  Overnight periods: Blood sugars are somewhat variable but relatively high readings in the first week approaching 180 average and more recently has 3 nights with excellent readings there is no hypoglycemia  Preprandial periods: Blood sugars are variably high in the morning but averaging about 150 Average at lunchtime is about 158 and dinnertime 146  Postprandial periods:   After breakfast:  Has only 1 or 2 days that sugars are significantly higher otherwise blood sugars are relatively level After lunch:   He has increase in blood sugar on most days but to variable extent and highest average 189 between 3-4 PM After dinner: Not enough data available but highest average blood sugar and 158 before midnight   Previous data:   CGM use % of time  100  Average and SD  140+/-37  Time in range       86%  % Time Above 180  13  % Time above 250   % Time Below target  0     Glycemic patterns summary: Average blood sugar and any given time of the day ranges between 120 at noon time to 160 between 7-8 AM Has some fluctuation after breakfast and late at night but usually blood sugars are within the  target range    Self-care:    Previous diabetes education: with CDE with pump start Diet: He has variable intake and variable times               Exercise:  Mostly active in the mornings when he is working  Weight history:   Wt Readings from Last 3 Encounters:  01/10/20 189 lb 12.8 oz (86.1 kg)  12/05/19 189 lb 9.6 oz (86 kg)  09/06/19 190 lb 9.6 oz (86.5 kg)    Glycemic control:   Lab Results  Component Value Date   HGBA1C 7.4 (H) 01/08/2020   HGBA1C 7.1 (H) 08/15/2019   HGBA1C 7.1 (A) 01/18/2019   Lab Results  Component Value Date   MICROALBUR <0.7 12/05/2019   LDLCALC 81 01/08/2020   CREATININE 0.96 01/08/2020    Other active problems addressed: See review of systems   Allergies as of 01/10/2020      Reactions   Bee Venom Swelling   Shellfish Allergy    Shrimp and lobster swelling but not severe anaphylaxis       Medication List       Accurate as of January 10, 2020  8:41 AM. If you have any questions, ask your nurse or doctor.        atorvastatin 20 MG tablet Commonly known as: LIPITOR Take 1 tablet (20 mg total) by mouth daily at 6 PM. Informed pt to reduce dose cut 40 mg in 1/2=20 mg new Rx will be 20 mg do not cut in 1/2 What changed: additional instructions   carvedilol 6.25 MG tablet Commonly known as: COREG Take 1 tablet (6.25 mg total) by mouth 2 (two) times daily with a meal.   Dexcom G6 Receiver Devi 1 each by Does not apply route See admin instructions. Use reader to monitor blood glucose continuously.   Dexcom G6 Sensor Misc 1 each by Does not apply route See admin instructions. Apply 1 sensor to body once every 10 days to monitor blood glucose.   Dexcom G6 Transmitter Misc USE 1 TRANSMITTER ONCE EVERY 90 DAYS IN CONJUNCTION WITH DEXCOM G6 SENSOR AND READER.   ezetimibe 10 MG tablet Commonly known as: Zetia Take 1 tablet (10 mg total) by mouth daily.   Farxiga 10 MG Tabs tablet Generic drug: dapagliflozin propanediol TAKE 1 TABLET  BY MOUTH DAILY   insulin lispro 100 UNIT/ML injection Commonly known as: HumaLOG USE MAX OF 80 UNITS PER DAY VIA OMNIPOD INSULIN PUMP.   latanoprost 0.005 % ophthalmic solution Commonly known as: XALATAN Place 1 drop into the left eye daily.   metFORMIN 750 MG  24 hr tablet Commonly known as: GLUCOPHAGE-XR Take 2 tablets (1,500 mg total) by mouth daily with breakfast.   Olmesartan-amLODIPine-HCTZ 40-10-12.5 MG Tabs Take 1 tablet by mouth daily. In am   [place] 1 each by Does not apply route daily. Use Omnipod PDM to administer insulin daily.   OmniPod Dash 5 Pack Pods Misc Inject 1 each into the skin every 3 (three) days.   potassium chloride 10 MEQ tablet Commonly known as: KLOR-CON Take 1 tablet (10 mEq total) by mouth daily.   spironolactone 50 MG tablet Commonly known as: ALDACTONE Take 1 tablet (50 mg total) by mouth daily. In am       Allergies:  Allergies  Allergen Reactions   Bee Venom Swelling   Shellfish Allergy     Shrimp and lobster swelling but not severe anaphylaxis      Past Medical History:  Diagnosis Date   Anxiety    COVID-19 virus detected    06/04/2019    Depression    Diabetes mellitus without complication (HCC)    Type 2   DKA (diabetic ketoacidoses) (HCC)    Heart murmur    Hypertension    Sleep apnea     No past surgical history on file.  Family History  Problem Relation Age of Onset   Diabetes Father    Hypertension Father    Diabetes Brother    Hypertension Mother    Hypertension Maternal Grandmother     Social History:  reports that he has never smoked. He has never used smokeless tobacco. He reports current alcohol use. He reports that he does not use drugs.     ROS  Hypertension: This is being managed by his PCP  He had fairly significant hypertension previously resistant to treatment  Currently taking triple therapy with Benicar/amlodipine/HCTZ as well as Coreg; Aldactone 50 mg was   restarted on his visit in 08/2018 Currently on 6.25 mg carvedilol  He does check blood pressure occasionally at home, recent readings 125/85-90  He has had had  hypokalemia with HCTZ and is now on Aldactone Previously trial of clonidine patch was unsuccessful because of local itching.  His aldosterone/renin activity level was high and the salt loading test showed the 24-hour urine aldosterone to be 20.3 However his CT scan did not show any adrenal abnormality  Recent home blood pressure: 120/80   BP Readings from Last 3 Encounters:  01/10/20 106/76  12/05/19 118/78  09/06/19 124/70   Lab Results  Component Value Date   CREATININE 0.96 01/08/2020   BUN 17 01/08/2020   NA 140 01/08/2020   K 4.5 01/08/2020   CL 105 01/08/2020   CO2 23 01/08/2020     Lipid history: Has been on 20 mg Lipitor as a statin drug along with Zetia from his PCP LDL is still below 100  He was previously taking 145 mg of fenofibrate and his triglycerides are progressively higher now   Lab Results  Component Value Date   CHOL 161 01/08/2020   CHOL 114 08/15/2019   CHOL 172 09/22/2018   Lab Results  Component Value Date   HDL 43 01/08/2020   HDL 41 08/15/2019   HDL 44.40 09/22/2018   Lab Results  Component Value Date   LDLCALC 81 01/08/2020   LDLCALC 45 08/15/2019   LDLCALC 107 (H) 04/11/2018   Lab Results  Component Value Date   TRIG 225 (H) 01/08/2020   TRIG 166 (H) 08/15/2019   TRIG (H) 09/22/2018  496.0 Triglyceride is over 400; calculations on Lipids are invalid.   Lab Results  Component Value Date   CHOLHDL 3.7 01/08/2020   CHOLHDL 2.8 08/15/2019   CHOLHDL 4 09/22/2018   Lab Results  Component Value Date   LDLDIRECT 48.0 09/22/2018   LDLDIRECT 74.0 06/09/2017   LDLDIRECT 112.0 07/13/2016     Most recent eye exam was 03/2019, he thinks he has been followed for glaucoma   Physical Examination:  BP 106/76 (BP Location: Left Arm, Patient Position: Sitting, Cuff  Size: Large)    Pulse 81    Ht 6' (1.829 m)    Wt 189 lb 12.8 oz (86.1 kg)    SpO2 98%    BMI 25.74 kg/m      ASSESSMENT:  Diabetes type 2, insulin-dependent, treated with Omni pod insulin pump  See history of present illness for detailed discussion of current diabetes management, blood sugar patterns and problems identified  Blood sugar control is appearing to be improving with the insulin pump  A1c is 7.4  Not clear if he has benefited from [place] However most of his insulin is in the form of basal insulin and he likely needs to do more boluses consistently Since he tends to get somewhat lower readings with activity does not take boluses to cover his meals at work Otherwise may have low carbohydrate meals He thinks he can count carbohydrates fairly well   HYPERTENSION:   This is well controlled as before and he monitors at home periodically  Has been followed by PCP His potassium has been normal with Aldactone    HYPERLIPIDEMIA: Triglycerides are going up, not clear if this is from somewhat higher fat intake at times LDL controlled with 2 drugs  PLAN:    He will try to bolus for any meals that have any carbohydrates especially when he is not active  Also if he is seeing the blood sugar rising significantly at mealtimes he needs to bolus at least at that time  His Dexcom sensor was programmed to the proper date today  He will try to use this consistently  Again if he has tendency to low normal sugars he can suspend his pump temporarily for 30 minutes  Continue Farxiga 10 mg daily  Continue to monitor blood pressure at home and report abnormal readings  FENOFIBRATE 145 mg daily again will need follow-up fasting labs for triglycerides  Follow-up in 4 months  Vitamin D 2000 units daily for recently low level  There are no Patient Instructions on file for this visit.     Elayne Snare 01/10/2020, 8:41 AM   Note: This office note was prepared with Dragon voice  recognition system technology. Any transcriptional errors that result from this process are unintentional.

## 2020-03-06 ENCOUNTER — Other Ambulatory Visit: Payer: Self-pay | Admitting: Endocrinology

## 2020-03-16 ENCOUNTER — Other Ambulatory Visit: Payer: Self-pay | Admitting: Endocrinology

## 2020-03-21 ENCOUNTER — Other Ambulatory Visit: Payer: Self-pay | Admitting: Internal Medicine

## 2020-03-21 MED ORDER — METFORMIN HCL ER 750 MG PO TB24: 1500.0000 mg | ORAL_TABLET | Freq: Every day | ORAL | 11 refills | Status: DC

## 2020-04-05 ENCOUNTER — Telehealth: Payer: Self-pay | Admitting: Internal Medicine

## 2020-04-05 NOTE — Telephone Encounter (Signed)
Rejection Reason - Patient was No Show - Pt did not show for his appt. Pt did not call to cancel or reschedule."  [facility] said on Apr 05, 2020 10:55 AM

## 2020-05-15 ENCOUNTER — Ambulatory Visit: Payer: Managed Care, Other (non HMO) | Admitting: Endocrinology

## 2020-05-22 ENCOUNTER — Other Ambulatory Visit: Payer: Self-pay | Admitting: Endocrinology

## 2020-05-28 ENCOUNTER — Encounter: Payer: Self-pay | Admitting: Endocrinology

## 2020-05-28 ENCOUNTER — Encounter: Payer: Managed Care, Other (non HMO) | Admitting: Endocrinology

## 2020-05-28 NOTE — Progress Notes (Signed)
This encounter was created in error - please disregard.

## 2020-05-30 ENCOUNTER — Telehealth: Payer: Self-pay

## 2020-05-30 NOTE — Telephone Encounter (Signed)
Patient dismissed from [facility] by Reather Littler MD, effective 05/28/2020. Dismissal letter was sent by registered mail. js

## 2020-06-05 ENCOUNTER — Ambulatory Visit: Payer: Managed Care, Other (non HMO) | Admitting: Internal Medicine

## 2020-06-06 ENCOUNTER — Other Ambulatory Visit: Payer: Self-pay | Admitting: Endocrinology

## 2020-06-16 ENCOUNTER — Other Ambulatory Visit: Payer: Self-pay | Admitting: Endocrinology

## 2020-06-20 ENCOUNTER — Other Ambulatory Visit: Payer: Self-pay | Admitting: Endocrinology

## 2020-07-02 ENCOUNTER — Telehealth: Payer: Self-pay | Admitting: Endocrinology

## 2020-07-02 ENCOUNTER — Other Ambulatory Visit: Payer: Self-pay

## 2020-07-02 DIAGNOSIS — I10 Essential (primary) hypertension: Secondary | ICD-10-CM

## 2020-07-02 MED ORDER — CARVEDILOL 6.25 MG PO TABS: 6.2500 mg | ORAL_TABLET | Freq: Two times a day (BID) | ORAL | 1 refills | Status: DC

## 2020-07-02 NOTE — Telephone Encounter (Signed)
He can come in tomorrow as long as he has adequate records of his blood sugars to show me.  Labs to be done same day

## 2020-07-02 NOTE — Telephone Encounter (Signed)
Spoke with patient who claims he did not receive letter of dismissal. I went over the no-show dates and patient followed along in MyChart. Patient did not see the appointment for 07/03/2019. Patient has asked if there was anyway he could have another chance and he would make sure to keep his appointments.

## 2020-07-03 ENCOUNTER — Ambulatory Visit (INDEPENDENT_AMBULATORY_CARE_PROVIDER_SITE_OTHER): Payer: 59 | Admitting: Endocrinology

## 2020-07-03 ENCOUNTER — Encounter: Payer: Self-pay | Admitting: Internal Medicine

## 2020-07-03 ENCOUNTER — Ambulatory Visit: Payer: Managed Care, Other (non HMO) | Admitting: Endocrinology

## 2020-07-03 ENCOUNTER — Encounter: Payer: Self-pay | Admitting: Endocrinology

## 2020-07-03 ENCOUNTER — Other Ambulatory Visit: Payer: Self-pay

## 2020-07-03 ENCOUNTER — Telehealth: Payer: Managed Care, Other (non HMO) | Admitting: Internal Medicine

## 2020-07-03 VITALS — BP 130/86 | HR 74 | Ht 72.0 in | Wt 188.6 lb

## 2020-07-03 VITALS — BP 130/86 | Ht 72.0 in | Wt 188.6 lb

## 2020-07-03 DIAGNOSIS — E559 Vitamin D deficiency, unspecified: Secondary | ICD-10-CM | POA: Diagnosis not present

## 2020-07-03 DIAGNOSIS — I10 Essential (primary) hypertension: Secondary | ICD-10-CM

## 2020-07-03 DIAGNOSIS — E1165 Type 2 diabetes mellitus with hyperglycemia: Secondary | ICD-10-CM

## 2020-07-03 DIAGNOSIS — Z794 Long term (current) use of insulin: Secondary | ICD-10-CM | POA: Diagnosis not present

## 2020-07-03 DIAGNOSIS — E876 Hypokalemia: Secondary | ICD-10-CM | POA: Diagnosis not present

## 2020-07-03 DIAGNOSIS — E782 Mixed hyperlipidemia: Secondary | ICD-10-CM

## 2020-07-03 DIAGNOSIS — Z1211 Encounter for screening for malignant neoplasm of colon: Secondary | ICD-10-CM | POA: Diagnosis not present

## 2020-07-03 DIAGNOSIS — E785 Hyperlipidemia, unspecified: Secondary | ICD-10-CM

## 2020-07-03 DIAGNOSIS — E1159 Type 2 diabetes mellitus with other circulatory complications: Secondary | ICD-10-CM | POA: Diagnosis not present

## 2020-07-03 DIAGNOSIS — I152 Hypertension secondary to endocrine disorders: Secondary | ICD-10-CM

## 2020-07-03 LAB — LIPID PANEL
Cholesterol: 110 mg/dL (ref 0–200)
HDL: 41.8 mg/dL (ref >39.00)
LDL Cholesterol: 43 mg/dL (ref 0–99)
NonHDL: 68.26
Total CHOL/HDL Ratio: 3
Triglycerides: 127 mg/dL (ref 0.0–149.0)
VLDL: 25.4 mg/dL (ref 0.0–40.0)

## 2020-07-03 LAB — COMPREHENSIVE METABOLIC PANEL
ALT: 31 U/L (ref 0–53)
AST: 25 U/L (ref 0–37)
Albumin: 5.2 g/dL (ref 3.5–5.2)
Alkaline Phosphatase: 39 U/L (ref 39–117)
BUN: 20 mg/dL (ref 6–23)
CO2: 29 mEq/L (ref 19–32)
Calcium: 10 mg/dL (ref 8.4–10.5)
Chloride: 102 mEq/L (ref 96–112)
Creatinine, Ser: 1.22 mg/dL (ref 0.40–1.50)
GFR: 69.02 mL/min (ref >60.00)
Glucose, Bld: 94 mg/dL (ref 70–99)
Potassium: 3.8 mEq/L (ref 3.5–5.1)
Sodium: 137 mEq/L (ref 135–145)
Total Bilirubin: 0.6 mg/dL (ref 0.2–1.2)
Total Protein: 8.3 g/dL (ref 6.0–8.3)

## 2020-07-03 LAB — POCT GLYCOSYLATED HEMOGLOBIN (HGB A1C): Hemoglobin A1C: 7.3 % — AB (ref 4.0–5.6)

## 2020-07-03 LAB — VITAMIN D 25 HYDROXY (VIT D DEFICIENCY, FRACTURES): VITD: 21.93 ng/mL — ABNORMAL LOW (ref 30.00–100.00)

## 2020-07-03 MED ORDER — OLMESARTAN-AMLODIPINE-HCTZ 40-10-12.5 MG PO TABS: 1.0000 | ORAL_TABLET | Freq: Every day | ORAL | 3 refills | Status: DC

## 2020-07-03 MED ORDER — SPIRONOLACTONE 50 MG PO TABS: 50.0000 mg | ORAL_TABLET | Freq: Every day | ORAL | 3 refills | Status: DC

## 2020-07-03 MED ORDER — POTASSIUM CHLORIDE ER 10 MEQ PO TBCR: 10.0000 meq | EXTENDED_RELEASE_TABLET | Freq: Every day | ORAL | 3 refills | Status: DC

## 2020-07-03 MED ORDER — CARVEDILOL 12.5 MG PO TABS: 12.5000 mg | ORAL_TABLET | Freq: Two times a day (BID) | ORAL | 3 refills | Status: DC

## 2020-07-03 NOTE — Addendum Note (Signed)
Addended by: Adline Mango I on: 07/03/2020 12:06 PM   Modules accepted: Orders

## 2020-07-03 NOTE — Progress Notes (Signed)
telephone Note  I connected with Craig Johnson  on 07/03/20 at 11:20 AM EST by telephone and verified that I am speaking with the correct person using two identifiers.  Location patient: car Location provider:work or home office Persons participating in the virtual visit: patient, provider  I discussed the limitations of evaluation and management by telemedicine and the availability of in person appointments. The patient expressed understanding and agreed to proceed.   HPI: 1. DM 2 7.3 had f/u Dr. Dwyane Dee and HLD on tricor 145 mg qd, zetia 10 mg qd, lipitor 20 mg qd tolerating  BP dbp elevated upper 80s on coreg 6.25 mg bid, olmesartan norvasc hctz 40-10/12.5, spironolactone 50 mg qd will increase coreg today no other complaints Had eye surgery lens replacement b/l 02/2020 and 03/2020  ROS: See pertinent positives and negatives per HPI.  Past Medical History:  Diagnosis Date  . Anxiety   . COVID-19 virus detected    06/04/2019   . Depression   . Diabetes mellitus without complication (HCC)    Type 2  . DKA (diabetic ketoacidoses)   . Heart murmur   . Hypertension   . Sleep apnea     Past Surgical History:  Procedure Laterality Date  . lens replacement     03/21/20 left eye right eye 03/2020 Dr. Julian Reil     Current Outpatient Medications:  .  atorvastatin (LIPITOR) 20 MG tablet, Take 1 tablet (20 mg total) by mouth daily at 6 PM. Informed pt to reduce dose cut 40 mg in 1/2=20 mg new Rx will be 20 mg do not cut in 1/2 (Patient taking differently: Take 20 mg by mouth daily at 6 PM. Take 1 tablet by mouth once daily.), Disp: 90 tablet, Rfl: 3 .  Continuous Blood Gluc Receiver ([place]) DEVI, 1 each by Does not apply route See admin instructions. Use reader to monitor blood glucose continuously., Disp: 1 each, Rfl: 1 .  Continuous Blood Gluc Sensor (DEXCOM G6 SENSOR) MISC, APPLY 1 SENSOR TO BODY ONCE EVERY 10 DAYS TO MONITOR BLOOD GLUCOSE, Disp: 3 each, Rfl: 0 .   Continuous Blood Gluc Transmit (DEXCOM G6 TRANSMITTER) MISC, USE 1 TRANSMITTER EVERY 90 DAYS, Disp: 1 each, Rfl: 3 .  ezetimibe (ZETIA) 10 MG tablet, Take 1 tablet (10 mg total) by mouth daily., Disp: 90 tablet, Rfl: 3 .  FARXIGA 10 MG TABS tablet, TAKE 1 TABLET BY MOUTH DAILY, Disp: 30 tablet, Rfl: 2 .  fenofibrate (TRICOR) 145 MG tablet, Take 1 tablet (145 mg total) by mouth daily., Disp: 90 tablet, Rfl: 2 .  Insulin Disposable Pump (OMNIPOD DASH 5 PACK PODS) MISC, INJECT UNDER THE SKIN EVERY 3 DAYS AS DIRECTED, Disp: 15 each, Rfl: 2 .  Insulin Disposable Pump (OMNIPOD DASH SYSTEM) KIT, 1 each by Does not apply route daily. Use Omnipod PDM to administer insulin daily., Disp: 1 kit, Rfl: 0 .  insulin lispro (HUMALOG) 100 UNIT/ML injection, USE MAX OF 80 UNITS PER DAY VIA OMNIPOD INSULIN PUMP., Disp: 30 mL, Rfl: 3 .  metFORMIN (GLUCOPHAGE-XR) 750 MG 24 hr tablet, Take 2 tablets (1,500 mg total) by mouth daily with breakfast., Disp: 60 tablet, Rfl: 11 .  carvedilol (COREG) 12.5 MG tablet, Take 1 tablet (12.5 mg total) by mouth 2 (two) times daily with a meal., Disp: 180 tablet, Rfl: 3 .  Olmesartan-amLODIPine-HCTZ 40-10-12.5 MG TABS, Take 1 tablet by mouth daily. In am, Disp: 90 tablet, Rfl: 3 .  potassium chloride (KLOR-CON) 10 MEQ tablet,  Take 1 tablet (10 mEq total) by mouth daily., Disp: 90 tablet, Rfl: 3 .  spironolactone (ALDACTONE) 50 MG tablet, Take 1 tablet (50 mg total) by mouth daily. In am, Disp: 90 tablet, Rfl: 3  EXAM:  VITALS per patient if applicable:  GENERAL: alert, oriented, appears well and in no acute distress   PSYCH/NEURO: pleasant and cooperative, no obvious depression or anxiety, speech and thought processing grossly intact  ASSESSMENT AND PLAN:  Discussed the following assessment and plan:  Hypertension associated with diabetes (HCC) tricor 145 mg qd, zetia 10 mg qd, lipitor 20 mg qd tolerating  BP dbp elevated upper 80s on coreg 6.25 mg bid, olmesartan norvasc  hctz 40-10/12.5, spironolactone 50 mg qd Fu endocrine DM meds Hypokalemia - Plan: potassium chloride (KLOR-CON) 10 MEQ tablet  HM CPE at f/u  Consider HIV/hep C declines 07/03/20 Flu shotutdconsider in futurein 2021 Consider Tdap and pna 23 vx in future -given Tdap in office 12/05/19 Hep A 1/2 had h/o fatty liver 2nd due 08/23/2020  shingrix 2/2 consider in future start 08/2020  pna 23 vaccine consider in future Pfizer 2/2 disc booster  Consider MMR vaccine  Hep B immuneho fatty liver check hep A titer Declines STD check  PSA 08/15/19 0.8normal-recheck at f/u 2022  Colonoscopy age 7 y.o disc Dr. Alice Reichert today for future referral age 70 y.o -referred today again rec healthy diet and exercise [facility] [place] on [address] dx'ed 05/2020 +left eye glaucoma and placed on drops and s/p surgery lens replacement b/l 02/2020 and 03/2020   -we discussed possible serious and likely etiologies, options for evaluation and workup, limitations of telemedicine visit vs in person visit, treatment, treatment risks and precautions.     I discussed the assessment and treatment plan with the patient. The patient was provided an opportunity to ask questions and all were answered. The patient agreed with the plan and demonstrated an understanding of the instructions.    Time spent 20 min Delorise Jackson, MD

## 2020-07-03 NOTE — Patient Instructions (Addendum)
Basal rate 12 am is 1.8 until 6 AM ,  6 AM - 11 AM = 2.1 ,  11 AM- 1 PM = 1.8,  1 PM-7 PM = 2.0  7 PM = 2.3  BOLUS 3-6 UNITS AT LUNCH DAILY

## 2020-07-03 NOTE — Progress Notes (Signed)
Patient ID: Craig Johnson, male   DOB: 1970-07-08, 50 y.o.   MRN: [id number]           Reason for Appointment: Follow-up   Referring physician: None  History of Present Illness:          Date of diagnosis of type 2 diabetes mellitus: 2013       Background history:  He probably had significantly high blood sugars at time of diagnosis but details are not available. He thinks he was tried on various oral medications initially and these did not improve his blood sugar (Metformin, Januvia, Farxiga) Probably was started on insulin about 3 months after diagnosis Apparently was on as much as 60 units of Lantus daily.   He was started on an insulin pump with the OMNIPOD on 05/20/15  Recent history:   Oral hypoglycemic drugs the patient is taking are: Farxiga 10 mg daily, metformin ER 1500 mg daily       Omnipod PUMP settings :   Basal rate = 1.7 until 6 AM , 6 AM - 11 AM = 2.1 , 11 AM- 7 PM = 1.9,and 7 PM = 2.2   BOLUSES: carbohydrate ratio of 1:7,  Except 1:8 at lunch and correction factor 1:35 with target 120   His A1c is 7.3   Current blood sugar patterns, management and problems identified since last visit  He was last seen in July  Unable to download his insulin pump 1-4 times today  However appears to be having a lower basal rate of 1.9 during the day after 11 AM even though it was supposed to have a higher basal rate at 2 PM  He does not take boluses especially at work at breakfast or lunch because he is concerned about potential for low blood sugar  However he has fairly consistent HYPERGLYCEMIA after lunch in the afternoon  Sometimes will eat higher fat meals like [place] food and frequently blood sugar may consistently stay high until late in the evening  Most of his physical activity at work is in the mornings and not as much after lunch  Although his blood sugars are low normal before lunch at times he does not think he feels hypoglycemic  Continues to  take [place] regularly  Weight is about the same  Dexcom interpretation is discussed below   Side effects from medications have been: None  Compliance with the medical regimen: Improved   CONTINUOUS GLUCOSE MONITORING RECORD INTERPRETATION   Dates of Recording: Last 2 weeks Sensor description: G6   Glycemic patterns summary: Blood sugars are in an average within target range except late afternoon.  Blood sugars are trending higher starting midday until early evening but better overnight.  More variability present in the afternoons.    No significant hypoglycemia  Postprandial readings are mostly higher after lunch but at times after breakfast also  Blood sugars may be low normal between 11 AM-12 PM  Premeal blood sugars are minimally increased at breakfast but usually high at dinnertime with average about 170  Results statistics:  CGM use % of time  77  2-week average/GV  146/26  Time in range       79%  % Time Above 180  21  % Time above 250   % Time Below 70 0     PRE-MEAL Fasting Lunch Dinner Bedtime Overall  Glucose range:       Averages:  130       POST-MEAL PC Breakfast PC Lunch  PC Dinner  Glucose range:   64-261   Averages:  150      PREVIOUS data:   CGM use % of time  75  Average and SD  160, +/-39  Time in range  71       % was 86  % Time Above 180  29  % Time above 250   % Time Below target 0     Self-care:    Previous diabetes education: with CDE with pump start Diet: He has variable intake and variable times               Exercise:  Mostly active in the mornings when he is working  Weight history:   Wt Readings from Last 3 Encounters:  07/03/20 188 lb 9.6 oz (85.5 kg)  07/03/20 188 lb 9.6 oz (85.5 kg)  01/10/20 189 lb 12.8 oz (86.1 kg)    Glycemic control:   Lab Results  Component Value Date   HGBA1C 7.3 (A) 07/03/2020   HGBA1C 7.4 (H) 01/08/2020   HGBA1C 7.1 (H) 08/15/2019   Lab Results  Component Value Date   MICROALBUR  <0.7 12/05/2019   LDLCALC 81 01/08/2020   CREATININE 0.96 01/08/2020    Other active problems addressed: See review of systems   Allergies as of 07/03/2020      Reactions   Bee Venom Swelling   Shellfish Allergy    Shrimp and lobster swelling but not severe anaphylaxis       Medication List       Accurate as of July 03, 2020 11:41 AM. If you have any questions, ask your nurse or doctor.        STOP taking these medications   latanoprost 0.005 % ophthalmic solution Commonly known as: XALATAN Stopped by: Nino Glow McLean-Scocuzza, MD     TAKE these medications   atorvastatin 20 MG tablet Commonly known as: LIPITOR Take 1 tablet (20 mg total) by mouth daily at 6 PM. Informed pt to reduce dose cut 40 mg in 1/2=20 mg new Rx will be 20 mg do not cut in 1/2 What changed: additional instructions   carvedilol 12.5 MG tablet Commonly known as: COREG Take 1 tablet (12.5 mg total) by mouth 2 (two) times daily with a meal. What changed:   medication strength  how much to take Changed by: Delorise Jackson, MD   Dexcom G6 Receiver Devi 1 each by Does not apply route See admin instructions. Use reader to monitor blood glucose continuously.   Dexcom G6 Sensor Misc APPLY 1 SENSOR TO BODY ONCE EVERY 10 DAYS TO MONITOR BLOOD GLUCOSE   Dexcom G6 Transmitter Misc USE 1 TRANSMITTER EVERY 90 DAYS   ezetimibe 10 MG tablet Commonly known as: Zetia Take 1 tablet (10 mg total) by mouth daily.   Farxiga 10 MG Tabs tablet Generic drug: dapagliflozin propanediol TAKE 1 TABLET BY MOUTH DAILY   fenofibrate 145 MG tablet Commonly known as: Tricor Take 1 tablet (145 mg total) by mouth daily.   insulin lispro 100 UNIT/ML injection Commonly known as: HumaLOG USE MAX OF 80 UNITS PER DAY VIA OMNIPOD INSULIN PUMP.   metFORMIN 750 MG 24 hr tablet Commonly known as: GLUCOPHAGE-XR Take 2 tablets (1,500 mg total) by mouth daily with breakfast.   Olmesartan-amLODIPine-HCTZ 40-10-12.5  MG Tabs Take 1 tablet by mouth daily. In am   [place] 1 each by Does not apply route daily. Use Omnipod PDM to administer insulin daily.  OmniPod Dash 5 Pack Pods Misc INJECT UNDER THE SKIN EVERY 3 DAYS AS DIRECTED   potassium chloride 10 MEQ tablet Commonly known as: KLOR-CON Take 1 tablet (10 mEq total) by mouth daily.   spironolactone 50 MG tablet Commonly known as: ALDACTONE Take 1 tablet (50 mg total) by mouth daily. In am       Allergies:  Allergies  Allergen Reactions  . Bee Venom Swelling  . Shellfish Allergy     Shrimp and lobster swelling but not severe anaphylaxis      Past Medical History:  Diagnosis Date  . Anxiety   . COVID-19 virus detected    06/04/2019   . Depression   . Diabetes mellitus without complication (HCC)    Type 2  . DKA (diabetic ketoacidoses)   . Heart murmur   . Hypertension   . Sleep apnea     Past Surgical History:  Procedure Laterality Date  . lens replacement     03/21/20 left eye right eye 03/2020 Dr. Julian Reil    Family History  Problem Relation Age of Onset  . Diabetes Father   . Hypertension Father   . Diabetes Brother   . Hypertension Mother   . Hypertension Maternal Grandmother     Social History:  reports that he has never smoked. He has never used smokeless tobacco. He reports current alcohol use. He reports that he does not use drugs.     ROS  Hypertension: This is being managed by his PCP  He had fairly significant hypertension previously resistant to treatment  Currently taking triple therapy with Benicar/amlodipine/HCTZ as well as Coreg; Aldactone 50 mg was  restarted on his visit in 08/2018 Is on 6.25 mg carvedilol  He does check blood pressure occasionally at home, using a [employer] meter Recent home blood pressure: 130/80  He has had had  hypokalemia with HCTZ and is on Aldactone 50 mg Previously trial of clonidine patch was unsuccessful because of local itching.  His  aldosterone/renin activity level was high and the salt loading test showed the 24-hour urine aldosterone to be 20.3 However his CT scan did not show any adrenal abnormality   BP Readings from Last 3 Encounters:  07/03/20 130/86  07/03/20 130/86  01/10/20 106/76   Lab Results  Component Value Date   CREATININE 0.96 01/08/2020   BUN 17 01/08/2020   NA 140 01/08/2020   K 4.5 01/08/2020   CL 105 01/08/2020   CO2 23 01/08/2020     Lipid history: Has been on 20 mg Lipitor as a statin drug along with Zetia from his PCP LDL is below 100  He was told to restart his 145 mg of fenofibrate since his triglycerides are progressively higher    Lab Results  Component Value Date   CHOL 161 01/08/2020   CHOL 114 08/15/2019   CHOL 172 09/22/2018   Lab Results  Component Value Date   HDL 43 01/08/2020   HDL 41 08/15/2019   HDL 44.40 09/22/2018   Lab Results  Component Value Date   LDLCALC 81 01/08/2020   LDLCALC 45 08/15/2019   LDLCALC 107 (H) 04/11/2018   Lab Results  Component Value Date   TRIG 225 (H) 01/08/2020   TRIG 166 (H) 08/15/2019   TRIG (H) 09/22/2018    496.0 Triglyceride is over 400; calculations on Lipids are invalid.   Lab Results  Component Value Date   CHOLHDL 3.7 01/08/2020   CHOLHDL 2.8 08/15/2019   CHOLHDL 4  09/22/2018   Lab Results  Component Value Date   LDLDIRECT 48.0 09/22/2018   LDLDIRECT 74.0 06/09/2017   LDLDIRECT 112.0 07/13/2016     Most recent eye exam was 03/2019, he thinks he has been followed for glaucoma   Physical Examination:  BP 130/86   Pulse 74   Ht 6' (1.829 m)   Wt 188 lb 9.6 oz (85.5 kg)   SpO2 97%   BMI 25.58 kg/m      ASSESSMENT:  Diabetes type 2, insulin-dependent, treated with Omni pod insulin pump  See history of present illness for detailed discussion of current diabetes management, blood sugar patterns and problems identified  Blood sugar control is appearing to be improving with the insulin  pump  A1c is 7.4  His control is reasonably good but he is not bolusing for his meals especially lunchtime when he has the highest readings Discussed that he has readings as high as 266 after eating and is usually getting significant amount of carbohydrates at lunchtime and occasionally high-fat meals Also his basal rate was not programmed as directed on his afternoon settings He is also having some technical problems with his pump and this could not be downloaded today    HYPERTENSION:   Not clear if his home monitor is accurate and he needs to have this compared or get a new one Blood pressure appears to be high checked twice in the office today  His potassium has been normal with Aldactone but needs follow-up    HYPERLIPIDEMIA: Triglycerides will be rechecked today and fenofibrate was added on the last visit Also may need to avoid high-fat foods like [place]  PLAN:    He will try to bolus for lunch, start with 3 units and may need at least 6 units especially with [place] food  May also need some boluses for breakfast especially if eating higher carbohydrate  Basal rate will be increased up to 2.0 between 1 PM and 2 PM but reduced to 1.8 between 11 AM and 1 PM  He will try to get a new pump controller from the company if possible  Discussed that if he is getting low normal sugars while at work he can suspend his pump temporarily for 30 minutes  Continue Farxiga 10 mg daily  Have blood pressure monitor compared to the office readings likely needs a new 1  Consider increasing CARVEDILOL for better blood pressure control, he will discuss with PCP today  Follow-up in 3 months and keep regular appointments  Recheck vitamin D level  Patient Instructions   Basal rate 12 am is 1.8 until 6 AM ,  6 AM - 11 AM = 2.1 ,  11 AM- 1 PM = 1.8,  1 PM-7 PM = 2.0  7 PM = 2.3  BOLUS 3-6 UNITS AT LUNCH DAILY      Elayne Snare 07/03/2020, 11:41 AM   Note: This office note was  prepared with Dragon voice recognition system technology. Any transcriptional errors that result from this process are unintentional.

## 2020-07-05 NOTE — Progress Notes (Signed)
Vitamin D3, 4000 or 5000 units daily with a meal

## 2020-07-12 ENCOUNTER — Other Ambulatory Visit: Payer: Self-pay | Admitting: Internal Medicine

## 2020-07-12 DIAGNOSIS — E559 Vitamin D deficiency, unspecified: Secondary | ICD-10-CM

## 2020-07-12 MED ORDER — CHOLECALCIFEROL 1.25 MG (50000 UT) PO CAPS: 50000.0000 [IU] | ORAL_CAPSULE | ORAL | 0 refills | Status: DC

## 2020-07-15 ENCOUNTER — Encounter: Payer: Self-pay | Admitting: Internal Medicine

## 2020-07-21 ENCOUNTER — Encounter: Payer: Self-pay | Admitting: Internal Medicine

## 2020-07-23 ENCOUNTER — Other Ambulatory Visit: Payer: Self-pay | Admitting: Internal Medicine

## 2020-07-23 DIAGNOSIS — E785 Hyperlipidemia, unspecified: Secondary | ICD-10-CM

## 2020-07-23 MED ORDER — EZETIMIBE 10 MG PO TABS: 10.0000 mg | ORAL_TABLET | Freq: Every day | ORAL | 3 refills | Status: DC

## 2020-08-02 ENCOUNTER — Encounter: Payer: Self-pay | Admitting: Internal Medicine

## 2020-08-02 NOTE — Addendum Note (Signed)
Addended by: Quentin Ore on: 08/02/2020 05:22 PM   Modules accepted: Orders

## 2020-08-15 ENCOUNTER — Other Ambulatory Visit: Payer: Self-pay | Admitting: Endocrinology

## 2020-08-15 ENCOUNTER — Other Ambulatory Visit: Payer: Self-pay | Admitting: Internal Medicine

## 2020-08-15 DIAGNOSIS — E876 Hypokalemia: Secondary | ICD-10-CM

## 2020-08-15 DIAGNOSIS — I10 Essential (primary) hypertension: Secondary | ICD-10-CM

## 2020-08-16 ENCOUNTER — Other Ambulatory Visit: Payer: Self-pay | Admitting: Internal Medicine

## 2020-08-16 ENCOUNTER — Encounter: Payer: Self-pay | Admitting: Internal Medicine

## 2020-08-16 MED ORDER — DAPAGLIFLOZIN PROPANEDIOL 10 MG PO TABS: 10.0000 mg | ORAL_TABLET | Freq: Every day | ORAL | 3 refills | Status: DC

## 2020-08-16 NOTE — Telephone Encounter (Signed)
Looks like DC 12/06/19 ok to refill?

## 2020-08-18 ENCOUNTER — Encounter: Payer: Self-pay | Admitting: Internal Medicine

## 2020-08-23 ENCOUNTER — Ambulatory Visit: Payer: Managed Care, Other (non HMO)

## 2020-09-11 ENCOUNTER — Other Ambulatory Visit: Payer: Self-pay | Admitting: Internal Medicine

## 2020-09-13 ENCOUNTER — Encounter: Payer: Self-pay | Admitting: Internal Medicine

## 2020-10-11 ENCOUNTER — Other Ambulatory Visit: Payer: Self-pay | Admitting: Internal Medicine

## 2020-10-11 DIAGNOSIS — E785 Hyperlipidemia, unspecified: Secondary | ICD-10-CM

## 2020-10-17 ENCOUNTER — Encounter: Payer: Self-pay | Admitting: Internal Medicine

## 2020-10-21 ENCOUNTER — Other Ambulatory Visit: Payer: Self-pay

## 2020-10-21 ENCOUNTER — Ambulatory Visit (INDEPENDENT_AMBULATORY_CARE_PROVIDER_SITE_OTHER): Payer: 59

## 2020-10-21 DIAGNOSIS — Z111 Encounter for screening for respiratory tuberculosis: Secondary | ICD-10-CM

## 2020-10-21 NOTE — Progress Notes (Signed)
Patient presented for PPD injection to left forearm, patient voiced no concerns nor showed any signs of distress during injection. 

## 2020-10-22 LAB — HM COLONOSCOPY

## 2020-10-23 ENCOUNTER — Other Ambulatory Visit: Payer: Self-pay

## 2020-10-23 ENCOUNTER — Ambulatory Visit: Payer: 59

## 2020-10-23 DIAGNOSIS — Z111 Encounter for screening for respiratory tuberculosis: Secondary | ICD-10-CM

## 2020-10-23 LAB — TB SKIN TEST
Induration: 0 mm
TB Skin Test: NEGATIVE

## 2020-10-23 NOTE — Progress Notes (Signed)
Patient presented for PPD read of the left arm. Results where Negative / 

## 2020-10-28 ENCOUNTER — Other Ambulatory Visit: Payer: Managed Care, Other (non HMO)

## 2020-10-31 ENCOUNTER — Ambulatory Visit: Payer: Managed Care, Other (non HMO) | Admitting: Endocrinology

## 2020-11-05 ENCOUNTER — Encounter: Payer: Self-pay | Admitting: Internal Medicine

## 2020-11-05 ENCOUNTER — Telehealth: Payer: Self-pay

## 2020-11-05 ENCOUNTER — Other Ambulatory Visit: Payer: Self-pay

## 2020-11-05 ENCOUNTER — Ambulatory Visit (INDEPENDENT_AMBULATORY_CARE_PROVIDER_SITE_OTHER): Payer: 59 | Admitting: Internal Medicine

## 2020-11-05 VITALS — BP 120/84 | HR 60 | Temp 98.4°F | Ht 72.0 in | Wt 191.4 lb

## 2020-11-05 DIAGNOSIS — E1159 Type 2 diabetes mellitus with other circulatory complications: Secondary | ICD-10-CM

## 2020-11-05 DIAGNOSIS — E269 Hyperaldosteronism, unspecified: Secondary | ICD-10-CM | POA: Insufficient documentation

## 2020-11-05 DIAGNOSIS — Z23 Encounter for immunization: Secondary | ICD-10-CM

## 2020-11-05 DIAGNOSIS — I152 Hypertension secondary to endocrine disorders: Secondary | ICD-10-CM

## 2020-11-05 DIAGNOSIS — Z125 Encounter for screening for malignant neoplasm of prostate: Secondary | ICD-10-CM

## 2020-11-05 DIAGNOSIS — E559 Vitamin D deficiency, unspecified: Secondary | ICD-10-CM

## 2020-11-05 DIAGNOSIS — Z0000 Encounter for general adult medical examination without abnormal findings: Secondary | ICD-10-CM | POA: Diagnosis not present

## 2020-11-05 DIAGNOSIS — Z1329 Encounter for screening for other suspected endocrine disorder: Secondary | ICD-10-CM

## 2020-11-05 LAB — CBC WITH DIFFERENTIAL/PLATELET
Basophils Absolute: 0 10*3/uL (ref 0.0–0.1)
Basophils Relative: 0.6 % (ref 0.0–3.0)
Eosinophils Absolute: 0.5 10*3/uL (ref 0.0–0.7)
Eosinophils Relative: 7.8 % — ABNORMAL HIGH (ref 0.0–5.0)
HCT: 38.7 % — ABNORMAL LOW (ref 39.0–52.0)
Hemoglobin: 13.2 g/dL (ref 13.0–17.0)
Lymphocytes Relative: 35.4 % (ref 12.0–46.0)
Lymphs Abs: 2.3 10*3/uL (ref 0.7–4.0)
MCHC: 34.2 g/dL (ref 30.0–36.0)
MCV: 90.4 fl (ref 78.0–100.0)
Monocytes Absolute: 0.4 10*3/uL (ref 0.1–1.0)
Monocytes Relative: 5.6 % (ref 3.0–12.0)
Neutro Abs: 3.3 10*3/uL (ref 1.4–7.7)
Neutrophils Relative %: 50.6 % (ref 43.0–77.0)
Platelets: 185 10*3/uL (ref 150.0–400.0)
RBC: 4.28 Mil/uL (ref 4.22–5.81)
RDW: 13.1 % (ref 11.5–15.5)
WBC: 6.6 10*3/uL (ref 4.0–10.5)

## 2020-11-05 LAB — HEPATIC FUNCTION PANEL
ALT: 60 U/L — ABNORMAL HIGH (ref 0–53)
AST: 25 U/L (ref 0–37)
Albumin: 4.7 g/dL (ref 3.5–5.2)
Alkaline Phosphatase: 65 U/L (ref 39–117)
Bilirubin, Direct: 0.1 mg/dL (ref 0.0–0.3)
Total Bilirubin: 0.4 mg/dL (ref 0.2–1.2)
Total Protein: 7.4 g/dL (ref 6.0–8.3)

## 2020-11-05 LAB — LIPID PANEL
Cholesterol: 121 mg/dL (ref 0–200)
HDL: 34 mg/dL — ABNORMAL LOW (ref >39.00)
Total CHOL/HDL Ratio: 4
Triglycerides: 601 mg/dL — ABNORMAL HIGH (ref 0.0–149.0)

## 2020-11-05 LAB — PSA: PSA: 0.62 ng/mL (ref 0.10–4.00)

## 2020-11-05 LAB — TSH: TSH: 1.9 u[IU]/mL (ref 0.35–4.50)

## 2020-11-05 LAB — LDL CHOLESTEROL, DIRECT: Direct LDL: 32 mg/dL

## 2020-11-05 LAB — VITAMIN D 25 HYDROXY (VIT D DEFICIENCY, FRACTURES): VITD: 20.38 ng/mL — ABNORMAL LOW (ref 30.00–100.00)

## 2020-11-05 MED ORDER — CHOLECALCIFEROL 1.25 MG (50000 UT) PO CAPS
50000.0000 [IU] | ORAL_CAPSULE | ORAL | 0 refills | Status: DC
Start: 2020-11-05 — End: 2021-05-08

## 2020-11-05 MED ORDER — METFORMIN HCL ER 750 MG PO TB24: 1500.0000 mg | ORAL_TABLET | Freq: Every day | ORAL | 3 refills | Status: DC

## 2020-11-05 NOTE — Patient Instructions (Addendum)
voltaren gel 4x per day day  Ankle compression sleeve or brace  Consider covid 19 booster in 2-4 weeks  Consider shingrix vaccine for shingles   Zoster Vaccine, Recombinant injection What is this medicine? ZOSTER VACCINE (ZOS ter vak SEEN) is a vaccine used to reduce the risk of getting shingles. This vaccine is not used to treat shingles or nerve pain from shingles. This medicine may be used for other purposes; ask your health care provider or pharmacist if you have questions. COMMON BRAND NAME(S): [facility] What should I tell my health care provider before I take this medicine? They need to know if you have any of these conditions:  cancer  immune system problems  an unusual or allergic reaction to Zoster vaccine, other medications, foods, dyes, or preservatives  pregnant or trying to get pregnant  breast-feeding How should I use this medicine? This vaccine is injected into a muscle. It is given by a health care provider. A copy of Vaccine Information Statements will be given before each vaccination. Be sure to read this information carefully each time. This sheet may change often. Talk to your health care provider about the use of this vaccine in children. This vaccine is not approved for use in children. Overdosage: If you think you have taken too much of this medicine contact a poison control center or emergency room at once. NOTE: This medicine is only for you. Do not share this medicine with others. What if I miss a dose? Keep appointments for follow-up (booster) doses. It is important not to miss your dose. Call your health care provider if you are unable to keep an appointment. What may interact with this medicine?  medicines that suppress your immune system  medicines to treat cancer  steroid medicines like prednisone or cortisone This list may not describe all possible interactions. Give your health care provider a list of all the medicines, herbs, non-prescription  drugs, or dietary supplements you use. Also tell them if you smoke, drink alcohol, or use illegal drugs. Some items may interact with your medicine. What should I watch for while using this medicine? Visit your health care provider regularly. This vaccine, like all vaccines, may not fully protect everyone. What side effects may I notice from receiving this medicine? Side effects that you should report to your doctor or health care professional as soon as possible:  allergic reactions (skin rash, itching or hives; swelling of the face, lips, or tongue)  trouble breathing Side effects that usually do not require medical attention (report these to your doctor or health care professional if they continue or are bothersome):  chills  headache  fever  nausea  pain, redness, or irritation at site where injected  tiredness  vomiting This list may not describe all possible side effects. Call your doctor for medical advice about side effects. You may report side effects to FDA at 1-800-FDA-1088. Where should I keep my medicine? This vaccine is only given by a health care provider. It will not be stored at home. NOTE: This sheet is a summary. It may not cover all possible information. If you have questions about this medicine, talk to your doctor, pharmacist, or health care provider.  2021 Elsevier/Gold Standard (2019-07-21 16:23:07)  Ankle Exercises Ask your health care provider which exercises are safe for you. Do exercises exactly as told by your health care provider and adjust them as directed. It is normal to feel mild stretching, pulling, tightness, or mild discomfort as you do these exercises.  Stop right away if you feel sudden pain or your pain gets worse. Do not begin these exercises until told by your health care provider. Stretching and range-of-motion exercises These exercises warm up your muscles and joints and improve the movement and flexibility of your ankle. These exercises  may also help to relieve pain. Dorsiflexion/plantar flexion 1. Sit with your __________ knee straight or bent. Do not rest your foot on anything. 2. Flex your __________ ankle to tilt the top of your foot toward your shin. This is called dorsiflexion. 3. Hold this position for __________ seconds. 4. Point your toes downward to tilt the top of your foot away from your shin. This is called plantar flexion. 5. Hold this position for __________ seconds. Repeat __________ times. Complete this exercise __________ times a day.   Ankle alphabet 1. Sit with your __________ foot supported at your lower leg. ? Do not rest your foot on anything. ? Make sure your foot has room to move freely. 2. Think of your __________ foot as a paintbrush: ? Move your foot to trace each letter of the alphabet in the air. Keep your hip and knee still while you trace the letters. Trace every letter from A to Z. ? Make the letters as large as you can without causing or increasing any discomfort. Repeat __________ times. Complete this exercise __________ times a day.   Passive ankle dorsiflexion This is an exercise in which something or someone moves your ankle for you. You do not move it yourself. 1. Sit on a chair that is placed on a non-carpeted surface. 2. Place your __________ foot on the floor, directly under your __________ knee. Extend your __________ leg for support. 3. Keeping your heel down, slide your __________ foot back toward the chair until you feel a stretch at your ankle or calf. If you do not feel a stretch, slide your buttocks forward to the edge of the chair while keeping your heel down. 4. Hold this stretch for __________ seconds. Repeat __________ times. Complete this exercise __________ times a day. Strengthening exercises These exercises build strength and endurance in your ankle. Endurance is the ability to use your muscles for a long time, even after they get tired. Dorsiflexors These are  muscles that lift your foot up. 1. Secure a rubber exercise band or tube to an object, such as a table leg, that will stay still when the band is pulled. Secure the other end around your __________ foot. 2. Sit on the floor, facing the object with your __________ leg extended. The band or tube should be slightly tense when your foot is relaxed. 3. Slowly flex your __________ ankle and toes to bring your foot toward your shin. 4. Hold this position for __________ seconds. 5. Slowly return your foot to the starting position, controlling the band as you do that. Repeat __________ times. Complete this exercise __________ times a day.   Plantar flexors These are muscles that push your foot down. 1. Sit on the floor with your __________ leg extended. 2. Loop a rubber exercise band or tube around the ball of your __________ foot. The ball of your foot is on the walking surface, right under your toes. The band or tube should be slightly tense when your foot is relaxed. 3. Slowly point your toes downward, pushing them away from you. 4. Hold this position for __________ seconds. 5. Slowly release the tension in the band or tube, controlling smoothly until your foot is back in the  starting position. Repeat __________ times. Complete this exercise __________ times a day.   Towel curls 1. Sit in a chair on a non-carpeted surface, and put your feet on the floor. 2. Place a towel in front of your feet. If told by your health care provider, add a __________ pound weight to the end of the towel. 3. Keeping your heel on the floor, put your __________ foot on the towel. 4. Pull the towel toward you by grabbing the towel with your toes and curling them under. Keep your heel on the floor. 5. Let your toes relax. 6. Grab the towel again. Keep pulling the towel until it is completely underneath your foot. Repeat __________ times. Complete this exercise __________ times a day.   Standing plantar flexion This is an  exercise in which you use your toes to lift your body's weight while standing. 1. Stand with your feet shoulder-width apart. 2. Keep your weight spread evenly over the width of your feet while you rise up on your toes. Use a wall or table to steady yourself if needed, but try not to use it for support. 3. If this exercise is too easy, try these options: ? Shift your weight toward your __________ leg until you feel challenged. ? If told by your health care provider, lift your uninjured leg off the floor. 4. Hold this position for __________ seconds. Repeat __________ times. Complete this exercise __________ times a day.   Tandem walking 1. Stand with one foot directly in front of the other. 2. Slowly raise your back foot up, lifting your heel before your toes, and place it directly in front of your other foot. 3. Continue to walk in this heel-to-toe way for __________ or for as long as told by your health care provider. Have a countertop or wall nearby to use if needed to keep your balance, but try not to hold onto anything for support. Repeat __________ times. Complete this exercise __________ times a day. This information is not intended to replace advice given to you by your health care provider. Make sure you discuss any questions you have with your health care provider. Document Revised: 03/12/2018 Document Reviewed: 03/14/2018 Elsevier Patient Education  2021 [employer].

## 2020-11-05 NOTE — Progress Notes (Signed)
Chief Complaint  Patient presents with  . Follow-up   Annual  1. Left lateral ankle tenderness with pressing w/in the last few weeks no injury est Dr Laymond Purser if continues declines Xray for now  2. DM 2 A1C 7.4 10/03/20 endocrine [facility] f/u endocrine 01/2021 and has hyperaldosteronism and on aldactone could increase dose and stop K but for now Pt taking spironolactone 50 mg qd with K 10 meq On coreg 12.5 mg bid, farxiga 10, zetia 10 humalog, metformin, benicar-norvasc hctz 40-10-12.5 , aldactone 50 mg qd lipitor 20 mg   Review of Systems  Constitutional: Negative for weight loss.  HENT: Negative for hearing loss.   Eyes: Negative for blurred vision.  Respiratory: Negative for shortness of breath.   Cardiovascular: Negative for chest pain.  Gastrointestinal: Negative for abdominal pain.  Genitourinary: Negative for dysuria.  Musculoskeletal: Positive for joint pain.  Skin: Negative for rash.  Neurological: Negative for headaches.  Psychiatric/Behavioral: Negative for depression.   Past Medical History:  Diagnosis Date  . Anxiety   . COVID-19 virus detected    06/04/2019   . Depression   . Diabetes mellitus without complication (HCC)    Type 2  . DKA (diabetic ketoacidoses)   . Heart murmur   . Hypertension   . Sleep apnea    Past Surgical History:  Procedure Laterality Date  . lens replacement     03/21/20 left eye right eye 03/2020 Dr. Julian Reil   Family History  Problem Relation Age of Onset  . Diabetes Father   . Hypertension Father   . Diabetes Brother   . Hypertension Mother   . Hypertension Maternal Grandmother    Social History   Socioeconomic History  . Marital status: Married    Spouse name: Not on file  . Number of children: Not on file  . Years of education: Not on file  . Highest education level: Not on file  Occupational History  . Not on file  Tobacco Use  . Smoking status: Never Smoker  . Smokeless tobacco: Never Used  Substance and Sexual  Activity  . Alcohol use: Yes  . Drug use: No  . Sexual activity: Not on file  Other Topics Concern  . Not on file  Social History Narrative   Works [employer]    Married    Social Determinants of Radio broadcast assistant Strain: Not on [employer] Insecurity: Not on file  Transportation Needs: Not on file  Physical Activity: Not on file  Stress: Not on file  Social Connections: Not on file  Intimate Partner Violence: Not on file   Current Meds  Medication Sig  . atorvastatin (LIPITOR) 20 MG tablet TAKE 1 TABLET BY MOUTH EVERY DAY AT 6PM  . carvedilol (COREG) 12.5 MG tablet Take 1 tablet (12.5 mg total) by mouth 2 (two) times daily with a meal.  . Continuous Blood Gluc Receiver (Eros) [place] 1 each by Does not apply route See admin instructions. Use reader to monitor blood glucose continuously.  . Continuous Blood Gluc Sensor (DEXCOM G6 SENSOR) MISC APPLY 1 SENSOR TO BODY ONCE EVERY 10 DAYS TO MONITOR BLOOD SUGAR  . Continuous Blood Gluc Transmit (DEXCOM G6 TRANSMITTER) MISC USE 1 TRANSMITTER EVERY 90 DAYS  . dapagliflozin propanediol (FARXIGA) 10 MG TABS tablet Take 1 tablet (10 mg total) by mouth daily. D/c jardiance  . ezetimibe (ZETIA) 10 MG tablet Take 1 tablet (10 mg total) by mouth daily.  . Insulin Disposable  Pump (OMNIPOD DASH 5 PACK PODS) MISC INJECT UNDER THE SKIN EVERY 3 DAYS AS DIRECTED  . insulin lispro (HUMALOG) 100 UNIT/ML injection USE MAX OF 80 UNITS PER DAY VIA OMNIPOD INSULIN PUMP  . Olmesartan-amLODIPine-HCTZ 40-10-12.5 MG TABS Take 1 tablet by mouth daily. In am  . potassium chloride (KLOR-CON) 10 MEQ tablet TAKE 1 TABLET(10 MEQ) BY MOUTH DAILY  . spironolactone (ALDACTONE) 50 MG tablet TAKE 1 TABLET(50 MG) BY MOUTH DAILY IN THE MORNING  . [DISCONTINUED] Cholecalciferol 1.25 MG (50000 UT) capsule Take 1 capsule (50,000 Units total) by mouth every 30 (thirty) days. D3  . [DISCONTINUED] metFORMIN (GLUCOPHAGE-XR) 750 MG 24 hr tablet Take 2 tablets (1,500  mg total) by mouth daily with breakfast.   Allergies  Allergen Reactions  . Bee Venom Swelling  . Shellfish Allergy     Shrimp and lobster swelling but not severe anaphylaxis     Recent Results (from the past 2160 hour(s))  TB Skin Test     Status: None   Collection Time: 10/23/20  2:40 PM  Result Value Ref Range   TB Skin Test Negative    Induration 0 mm   Objective  Body mass index is 25.96 kg/m. Wt Readings from Last 3 Encounters:  11/05/20 191 lb 6.4 oz (86.8 kg)  07/03/20 188 lb 9.6 oz (85.5 kg)  07/03/20 188 lb 9.6 oz (85.5 kg)   Temp Readings from Last 3 Encounters:  11/05/20 98.4 F (36.9 C) (Oral)  12/05/19 97.8 F (36.6 C)  03/24/19 (!) 95.9 F (35.5 C) (Temporal)   BP Readings from Last 3 Encounters:  11/05/20 120/84  07/03/20 130/86  07/03/20 130/86   Pulse Readings from Last 3 Encounters:  11/05/20 60  07/03/20 74  01/10/20 81    Physical Exam Vitals and nursing note reviewed.  Constitutional:      Appearance: Normal appearance. He is well-developed and well-groomed.  HENT:     Head: Normocephalic and atraumatic.  Cardiovascular:     Rate and Rhythm: Normal rate and regular rhythm.     Heart sounds: Normal heart sounds. No murmur heard.   Pulmonary:     Effort: Pulmonary effort is normal.     Breath sounds: Normal breath sounds.  Skin:    General: Skin is warm and dry.  Neurological:     General: No focal deficit present.     Mental Status: He is alert and oriented to person, place, and time. Mental status is at baseline.     Gait: Gait normal.  Psychiatric:        Attention and Perception: Attention and perception normal.        Mood and Affect: Mood and affect normal.        Speech: Speech normal.        Behavior: Behavior normal. Behavior is cooperative.        Thought Content: Thought content normal.        Cognition and Memory: Cognition and memory normal.        Judgment: Judgment normal.     Assessment  Plan  Annual  physical exam Consider HIV/hep C declines1/5/22 Flu shotutdconsider in futurein 2021 today pna 23 vxx 1  -given Tdap in office6/8/21 Hep A 1/2 hadh/o fatty liver 2nd due 08/23/2020 will disc in future  shingrix 2/2 consider  moderna 2/2 disc booster  Consider MMR vaccine  Hep B immuneho fatty liver check hep A titernot immune see above Declines STD check  PSA2/16/210.8normal-recheck at f/u 2022  Colonoscopy age 40 y.o disc Dr. Alice Reichert had 10/22/20 rec healthy diet and exercise [facility] [place] [address] dx'ed 05/2020 +left eye glaucoma and placed on drops and s/p surgery lens replacement b/l 02/2020 and 03/2020 sch 11/2020 per pt fax records informed pt    Hypertension due to hyperaldosteronism associated with diabetes ([place]) A1C 7.4 10/03/20 BP overall controlled - Plan: CBC w/Diff, Hepatic function panel, Lipid panel, Urinalysis, Routine w reflex microscopic, Microalbumin / creatinine urine ratio on aldactone could increase dose and stop K but for now Pt taking spironolactone 50 mg qd with K 10 meq On coreg 12.5 mg bid, farxiga 10, zetia 10 humalog, metformin, benicar-norvasc hctz 40-10-12.5 , aldactone 50 mg qd lipitor 20 mg  Foot exam today  F/u endocrine 01/2021 [facility]  Vitamin D deficiency - Plan: Vitamin D (25 hydroxy), Cholecalciferol 1.25 MG (50000 UT) capsule Q30 days    Provider: Dr. Olivia Mackie McLean-Scocuzza-[facility]

## 2020-11-05 NOTE — Telephone Encounter (Signed)
Lvm to return in 6 months to see PCP

## 2020-11-10 ENCOUNTER — Other Ambulatory Visit: Payer: Self-pay | Admitting: Internal Medicine

## 2020-11-10 ENCOUNTER — Other Ambulatory Visit: Payer: Self-pay | Admitting: Endocrinology

## 2020-11-18 ENCOUNTER — Other Ambulatory Visit: Payer: Self-pay | Admitting: Internal Medicine

## 2020-11-18 DIAGNOSIS — R748 Abnormal levels of other serum enzymes: Secondary | ICD-10-CM

## 2020-11-19 ENCOUNTER — Telehealth: Payer: Self-pay | Admitting: Internal Medicine

## 2020-11-19 NOTE — Telephone Encounter (Signed)
lft vm for pt to call ofc to sch US abdomen.thanks 

## 2020-11-20 ENCOUNTER — Telehealth: Payer: Self-pay | Admitting: Internal Medicine

## 2020-11-20 NOTE — Telephone Encounter (Signed)
lft vm for pt to call ofc to sch US thanks 

## 2020-11-26 ENCOUNTER — Telehealth: Payer: Self-pay | Admitting: Internal Medicine

## 2020-11-26 NOTE — Telephone Encounter (Signed)
lft vm for pt to call ofc to sch US abdomen.thanks 

## 2020-11-28 NOTE — Telephone Encounter (Signed)
Pt returned your call.  

## 2020-12-12 ENCOUNTER — Encounter: Payer: Self-pay | Admitting: Internal Medicine

## 2020-12-25 ENCOUNTER — Other Ambulatory Visit: Payer: Self-pay | Admitting: Internal Medicine

## 2020-12-25 DIAGNOSIS — I10 Essential (primary) hypertension: Secondary | ICD-10-CM

## 2021-01-03 LAB — HM DIABETES EYE EXAM

## 2021-01-04 ENCOUNTER — Encounter: Payer: Self-pay | Admitting: Internal Medicine

## 2021-01-06 ENCOUNTER — Ambulatory Visit
Admission: RE | Admit: 2021-01-06 | Discharge: 2021-01-06 | Disposition: A | Discharge status: Home / Self Care | Payer: BC Managed Care – PPO | Source: Ambulatory Visit | Attending: Internal Medicine | Admitting: Internal Medicine

## 2021-01-06 ENCOUNTER — Encounter: Payer: Self-pay | Admitting: Internal Medicine

## 2021-01-06 ENCOUNTER — Other Ambulatory Visit: Payer: Self-pay

## 2021-01-06 DIAGNOSIS — R748 Abnormal levels of other serum enzymes: Secondary | ICD-10-CM | POA: Insufficient documentation

## 2021-01-06 DIAGNOSIS — K760 Fatty (change of) liver, not elsewhere classified: Secondary | ICD-10-CM | POA: Diagnosis not present

## 2021-01-06 NOTE — Telephone Encounter (Signed)
Abstracted results into chart

## 2021-01-07 ENCOUNTER — Encounter: Payer: Self-pay | Admitting: Internal Medicine

## 2021-01-07 DIAGNOSIS — K828 Other specified diseases of gallbladder: Secondary | ICD-10-CM | POA: Insufficient documentation

## 2021-01-07 DIAGNOSIS — I714 Abdominal aortic aneurysm, without rupture, unspecified: Secondary | ICD-10-CM | POA: Insufficient documentation

## 2021-01-07 NOTE — Progress Notes (Signed)
Left message for patient to return call back for US results.

## 2021-02-06 DIAGNOSIS — E785 Hyperlipidemia, unspecified: Secondary | ICD-10-CM | POA: Diagnosis not present

## 2021-02-06 DIAGNOSIS — E269 Hyperaldosteronism, unspecified: Secondary | ICD-10-CM | POA: Diagnosis not present

## 2021-02-06 DIAGNOSIS — E119 Type 2 diabetes mellitus without complications: Secondary | ICD-10-CM | POA: Diagnosis not present

## 2021-02-06 DIAGNOSIS — E1169 Type 2 diabetes mellitus with other specified complication: Secondary | ICD-10-CM | POA: Diagnosis not present

## 2021-02-06 DIAGNOSIS — E559 Vitamin D deficiency, unspecified: Secondary | ICD-10-CM | POA: Diagnosis not present

## 2021-02-06 DIAGNOSIS — I152 Hypertension secondary to endocrine disorders: Secondary | ICD-10-CM | POA: Diagnosis not present

## 2021-02-06 LAB — HEMOGLOBIN A1C: Hemoglobin A1C: 8.4

## 2021-03-25 ENCOUNTER — Other Ambulatory Visit: Payer: Self-pay | Admitting: Internal Medicine

## 2021-03-25 DIAGNOSIS — E785 Hyperlipidemia, unspecified: Secondary | ICD-10-CM

## 2021-05-08 ENCOUNTER — Encounter: Payer: Self-pay | Admitting: Internal Medicine

## 2021-05-08 ENCOUNTER — Ambulatory Visit (INDEPENDENT_AMBULATORY_CARE_PROVIDER_SITE_OTHER): Payer: BC Managed Care – PPO | Admitting: Internal Medicine

## 2021-05-08 ENCOUNTER — Other Ambulatory Visit: Payer: Self-pay

## 2021-05-08 VITALS — BP 136/88 | HR 67 | Temp 97.7°F | Ht 72.0 in | Wt 194.4 lb

## 2021-05-08 DIAGNOSIS — Z23 Encounter for immunization: Secondary | ICD-10-CM | POA: Diagnosis not present

## 2021-05-08 DIAGNOSIS — E1159 Type 2 diabetes mellitus with other circulatory complications: Secondary | ICD-10-CM | POA: Diagnosis not present

## 2021-05-08 DIAGNOSIS — I152 Hypertension secondary to endocrine disorders: Secondary | ICD-10-CM | POA: Diagnosis not present

## 2021-05-08 DIAGNOSIS — E559 Vitamin D deficiency, unspecified: Secondary | ICD-10-CM

## 2021-05-08 DIAGNOSIS — E785 Hyperlipidemia, unspecified: Secondary | ICD-10-CM | POA: Diagnosis not present

## 2021-05-08 LAB — MICROALBUMIN / CREATININE URINE RATIO
Creatinine,U: 169.5 mg/dL
Microalb Creat Ratio: 0.4 mg/g (ref 0.0–30.0)
Microalb, Ur: <0.7 mg/dL (ref 0.0–1.9)

## 2021-05-08 MED ORDER — CHOLECALCIFEROL 1.25 MG (50000 UT) PO CAPS: 50000.0000 [IU] | ORAL_CAPSULE | ORAL | 3 refills | Status: DC

## 2021-05-08 MED ORDER — ATORVASTATIN CALCIUM 20 MG PO TABS: 20.0000 mg | ORAL_TABLET | Freq: Every day | ORAL | 3 refills | Status: DC

## 2021-05-08 MED ORDER — EZETIMIBE 10 MG PO TABS: ORAL_TABLET | ORAL | 3 refills | Status: DC

## 2021-05-08 NOTE — Patient Instructions (Addendum)
[facility]   [address]   [place], [place] [address]   [phone]   Erlene Quan, MD   [address]   [place], [place] [address]   [phone] (Work)   [phone] (Fax   Consider 4/4 covid shots  Consider shingrix vaccine here or pharmacy

## 2021-05-08 NOTE — Progress Notes (Signed)
Chief Complaint  Patient presents with   Follow-up   Fu 1. Htn sl elevated on coreg 12.5 mg, olmesartan norvasc hctz 40-10/-12.5 mg and spironlactone 50 mg qd at home 120s-130s/80s-90s is eating out more and will try to change diet  2. Hld trig >400 451 02/06/21 on lipitor 20 stopped zetia 10 but will resume had trigs down to 127 when on combination  3. Dm 2 on insulin pump and metformin xr 750 mg qd f/u [facility] also on farxiga 10 mg qd   Review of Systems  Constitutional:  Negative for weight loss.  HENT:  Negative for hearing loss.   Eyes:  Negative for blurred vision.  Respiratory:  Negative for shortness of breath.   Cardiovascular:  Negative for chest pain.  Gastrointestinal:  Negative for abdominal pain and blood in stool.  Musculoskeletal:  Negative for back pain.  Skin:  Negative for rash.  Neurological:  Negative for headaches.  Psychiatric/Behavioral:  Negative for depression.   Past Medical History:  Diagnosis Date   Anxiety    COVID-19 virus detected    06/04/2019    Depression    Diabetes mellitus without complication (HCC)    Type 2   DKA (diabetic ketoacidoses)    Heart murmur    Hypertension    Sleep apnea    Past Surgical History:  Procedure Laterality Date   lens replacement     03/21/20 left eye right eye 03/2020 Dr. Julian Reil   Family History  Problem Relation Age of Onset   Diabetes Father    Hypertension Father    Diabetes Brother    Hypertension Mother    Hypertension Maternal Grandmother    Social History   Socioeconomic History   Marital status: Married    Spouse name: Not on file   Number of children: Not on file   Years of education: Not on file   Highest education level: Not on file  Occupational History   Not on file  Tobacco Use   Smoking status: Never   Smokeless tobacco: Never  Substance and Sexual Activity   Alcohol use: Yes   Drug use: No   Sexual activity: Not on file  Other Topics Concern   Not on file   Social History Narrative   Works [employer] 05/08/21    Married    Social Determinants of Radio broadcast assistant Strain: Not on file  Food Insecurity: Not on file  Transportation Needs: Not on file  Physical Activity: Not on file  Stress: Not on file  Social Connections: Not on file  Intimate Partner Violence: Not on file   Current Meds  Medication Sig   Continuous Blood Gluc Receiver (DEXCOM G6 RECEIVER) DEVI USE READER TO MONITOR BLOOD GLUCOSE CONTINUOUSLY   Continuous Blood Gluc Sensor (DEXCOM G6 SENSOR) MISC APPLY 1 SENSOR TO BODY ONCE EVERY 10 DAYS TO MONITOR BLOOD SUGAR   Continuous Blood Gluc Transmit (DEXCOM G6 TRANSMITTER) MISC USE 1 TRANSMITTER EVERY 90 DAYS   dapagliflozin propanediol (FARXIGA) 10 MG TABS tablet Take 1 tablet (10 mg total) by mouth daily. D/c jardiance   Insulin Disposable Pump (OMNIPOD DASH 5 PACK PODS) MISC INJECT UNDER THE SKIN EVERY 3 DAYS AS DIRECTED   insulin lispro (HUMALOG) 100 UNIT/ML injection USE MAX OF 80 UNITS PER DAY VIA OMNIPOD INSULIN PUMP   metFORMIN (GLUCOPHAGE-XR) 750 MG 24 hr tablet Take 2 tablets (1,500 mg total) by mouth daily with breakfast.   Olmesartan-amLODIPine-HCTZ 40-10-12.5 MG  TABS TAKE 1 TABLET BY MOUTH DAILY IN THE MORNING   potassium chloride (KLOR-CON) 10 MEQ tablet TAKE 1 TABLET(10 MEQ) BY MOUTH DAILY   spironolactone (ALDACTONE) 50 MG tablet TAKE 1 TABLET(50 MG) BY MOUTH DAILY IN THE MORNING   [DISCONTINUED] Cholecalciferol 1.25 MG (50000 UT) capsule Take 1 capsule (50,000 Units total) by mouth every 30 (thirty) days. D3   Allergies  Allergen Reactions   Bee Venom Swelling   Shellfish Allergy     Shrimp and lobster swelling but not severe anaphylaxis     No results found for this or any previous visit (from the past 2160 hour(s)). Objective  Body mass index is 26.37 kg/m. Wt Readings from Last 3 Encounters:  05/08/21 194 lb 6.4 oz (88.2 kg)  11/05/20 191 lb 6.4 oz (86.8 kg)  07/03/20 188 lb 9.6  oz (85.5 kg)   Temp Readings from Last 3 Encounters:  05/08/21 97.7 F (36.5 C) (Temporal)  11/05/20 98.4 F (36.9 C) (Oral)  12/05/19 97.8 F (36.6 C)   BP Readings from Last 3 Encounters:  05/08/21 136/88  11/05/20 120/84  07/03/20 130/86   Pulse Readings from Last 3 Encounters:  05/08/21 67  11/05/20 60  07/03/20 74    Physical Exam Vitals and nursing note reviewed.  Constitutional:      Appearance: Normal appearance. He is well-developed and well-groomed. He is obese.  HENT:     Head: Normocephalic and atraumatic.  Eyes:     Conjunctiva/sclera: Conjunctivae normal.     Pupils: Pupils are equal, round, and reactive to light.  Cardiovascular:     Rate and Rhythm: Normal rate and regular rhythm.     Heart sounds: Normal heart sounds.  Pulmonary:     Effort: Pulmonary effort is normal. No respiratory distress.     Breath sounds: Normal breath sounds.  Abdominal:     Tenderness: There is no abdominal tenderness.  Musculoskeletal:     Lumbar back: Tenderness present. Negative right straight leg raise test and negative left straight leg raise test.  Skin:    General: Skin is warm and moist.  Neurological:     General: No focal deficit present.     Mental Status: He is alert and oriented to person, place, and time. Mental status is at baseline.     Sensory: Sensation is intact.     Motor: Motor function is intact.     Coordination: Coordination is intact.     Gait: Gait is intact. Gait normal.  Psychiatric:        Attention and Perception: Attention and perception normal.        Mood and Affect: Mood and affect normal.        Speech: Speech normal.        Behavior: Behavior normal. Behavior is cooperative.        Thought Content: Thought content normal.        Cognition and Memory: Cognition and memory normal.        Judgment: Judgment normal.    Assessment  Plan  Hypertension associated with diabetes ([place]) - Plan: Microalbumin / creatinine urine  ratio olmesartan norvasc hctz 40-10/-12.5 mg and spironlactone 50 mg qd at home 120s-130s/80s-90s is eating out more and will try to change diet low salt  Dm 2 on insulin pump and metformin xr 750 mg qd f/u [facility] also on farxiga 10 mg qd   Hyperlipidemia, unspecified hyperlipidemia type - Plan: atorvastatin (LIPITOR) 20 MG tablet, ezetimibe (ZETIA)  10 MG tablet  Vitamin D deficiency - Plan: Cholecalciferol 1.25 MG (50000 UT) capsule Q30 days  HM HIV/hep C declines 07/03/20 Flu shot utd consider in future in 2021  today pna 23 vxx 1  -given Tdap in office 12/05/19 Hep A 1/2 had h/o fatty liver 2nd due 08/23/2020 will disc in future  shingrix 2/2 consider  moderna 2/2 disc boosters   Consider MMR vaccine  Hep B immune ho fatty liver check hep A titer not immune see above Declines STD check    PSA 0.62 11/05/20 normal  Colonoscopy age 48 y.o disc Dr. Alice Reichert had 10/22/20 hemorrhoids   rec healthy diet and exercise   Eye [place] [facility] in [place] [address] dx'ed 05/2020 +left eye glaucoma and placed on drops and s/p surgery lens replacement b/l 02/2020 and 03/2020 sch 11/2020 per pt fax records informed pt      F/u endocrine 01/2021 [facility]       Provider: Dr. Olivia Mackie McLean-Scocuzza-Internal Medicine

## 2021-06-04 DIAGNOSIS — E119 Type 2 diabetes mellitus without complications: Secondary | ICD-10-CM | POA: Diagnosis not present

## 2021-06-06 ENCOUNTER — Other Ambulatory Visit: Payer: Self-pay

## 2021-06-06 ENCOUNTER — Encounter: Payer: Self-pay | Admitting: Internal Medicine

## 2021-06-06 DIAGNOSIS — E876 Hypokalemia: Secondary | ICD-10-CM

## 2021-06-06 MED ORDER — POTASSIUM CHLORIDE ER 10 MEQ PO TBCR: EXTENDED_RELEASE_TABLET | ORAL | 1 refills | Status: DC

## 2021-06-27 DIAGNOSIS — Z20822 Contact with and (suspected) exposure to covid-19: Secondary | ICD-10-CM | POA: Diagnosis not present

## 2021-06-27 DIAGNOSIS — Z03818 Encounter for observation for suspected exposure to other biological agents ruled out: Secondary | ICD-10-CM | POA: Diagnosis not present

## 2021-10-05 ENCOUNTER — Encounter: Payer: Self-pay | Admitting: Internal Medicine

## 2021-12-11 ENCOUNTER — Other Ambulatory Visit: Payer: Self-pay | Admitting: Internal Medicine

## 2021-12-11 DIAGNOSIS — E876 Hypokalemia: Secondary | ICD-10-CM

## 2021-12-17 ENCOUNTER — Other Ambulatory Visit: Payer: Self-pay | Admitting: Internal Medicine

## 2021-12-31 DIAGNOSIS — E785 Hyperlipidemia, unspecified: Secondary | ICD-10-CM | POA: Diagnosis not present

## 2021-12-31 DIAGNOSIS — I152 Hypertension secondary to endocrine disorders: Secondary | ICD-10-CM | POA: Diagnosis not present

## 2021-12-31 DIAGNOSIS — E1169 Type 2 diabetes mellitus with other specified complication: Secondary | ICD-10-CM | POA: Diagnosis not present

## 2021-12-31 DIAGNOSIS — E269 Hyperaldosteronism, unspecified: Secondary | ICD-10-CM | POA: Diagnosis not present

## 2021-12-31 LAB — HEMOGLOBIN A1C: Hemoglobin A1C: 7.8

## 2022-01-06 ENCOUNTER — Encounter: Payer: Self-pay | Admitting: Internal Medicine

## 2022-01-06 ENCOUNTER — Ambulatory Visit (INDEPENDENT_AMBULATORY_CARE_PROVIDER_SITE_OTHER): Payer: BC Managed Care – PPO | Admitting: Internal Medicine

## 2022-01-06 VITALS — BP 126/80 | HR 75 | Temp 98.2°F | Resp 14 | Ht 72.0 in | Wt 191.8 lb

## 2022-01-06 DIAGNOSIS — Z1329 Encounter for screening for other suspected endocrine disorder: Secondary | ICD-10-CM

## 2022-01-06 DIAGNOSIS — Z125 Encounter for screening for malignant neoplasm of prostate: Secondary | ICD-10-CM | POA: Diagnosis not present

## 2022-01-06 DIAGNOSIS — Z13818 Encounter for screening for other digestive system disorders: Secondary | ICD-10-CM

## 2022-01-06 DIAGNOSIS — Z0000 Encounter for general adult medical examination without abnormal findings: Secondary | ICD-10-CM

## 2022-01-06 DIAGNOSIS — E559 Vitamin D deficiency, unspecified: Secondary | ICD-10-CM | POA: Diagnosis not present

## 2022-01-06 DIAGNOSIS — E785 Hyperlipidemia, unspecified: Secondary | ICD-10-CM

## 2022-01-06 DIAGNOSIS — I152 Hypertension secondary to endocrine disorders: Secondary | ICD-10-CM

## 2022-01-06 DIAGNOSIS — I10 Essential (primary) hypertension: Secondary | ICD-10-CM

## 2022-01-06 DIAGNOSIS — Z23 Encounter for immunization: Secondary | ICD-10-CM | POA: Diagnosis not present

## 2022-01-06 DIAGNOSIS — E1159 Type 2 diabetes mellitus with other circulatory complications: Secondary | ICD-10-CM

## 2022-01-06 DIAGNOSIS — E876 Hypokalemia: Secondary | ICD-10-CM

## 2022-01-06 MED ORDER — SPIRONOLACTONE 50 MG PO TABS
ORAL_TABLET | ORAL | 3 refills | Status: DC
Start: 2022-01-06 — End: 2023-10-13

## 2022-01-06 MED ORDER — EZETIMIBE 10 MG PO TABS: ORAL_TABLET | ORAL | 3 refills | Status: AC

## 2022-01-06 MED ORDER — CARVEDILOL 12.5 MG PO TABS: 12.5000 mg | ORAL_TABLET | Freq: Two times a day (BID) | ORAL | 3 refills | Status: DC

## 2022-01-06 MED ORDER — ATORVASTATIN CALCIUM 20 MG PO TABS: 20.0000 mg | ORAL_TABLET | Freq: Every day | ORAL | 3 refills | Status: AC

## 2022-01-06 MED ORDER — POTASSIUM CHLORIDE ER 10 MEQ PO TBCR: 10.0000 meq | EXTENDED_RELEASE_TABLET | Freq: Every day | ORAL | 3 refills | Status: AC

## 2022-01-06 MED ORDER — OLMESARTAN-AMLODIPINE-HCTZ 40-10-12.5 MG PO TABS: 1.0000 | ORAL_TABLET | Freq: Every morning | ORAL | 3 refills | Status: DC

## 2022-01-06 MED ORDER — CHOLECALCIFEROL 1.25 MG (50000 UT) PO CAPS: 50000.0000 [IU] | ORAL_CAPSULE | ORAL | 3 refills | Status: AC

## 2022-01-06 MED ORDER — METFORMIN HCL ER 750 MG PO TB24: 1500.0000 mg | ORAL_TABLET | Freq: Every day | ORAL | 3 refills | Status: DC

## 2022-01-06 NOTE — Progress Notes (Signed)
Chief Complaint  Patient presents with   Follow-up    Denies any concerns or pain   Follow up/annual 1. Htn controlled with endocrine being c/w hyperaldosteronism on coreg clarify dose 12.5 bid or 25 mg bid, olmesartan-norvasc hctz 40-10/12.5 and spironolactone 50 mg qd with K 10 meq Dm 2 A1c 7.8 12/31/21 had insulin pump on statin clarify lipitor 20 or 40 mg dose and on farxiga 10 mg qd zetia 10, metformin 750 2 pills qd    Review of Systems  Constitutional:  Negative for weight loss.  HENT:  Negative for hearing loss.   Eyes:  Negative for blurred vision.  Respiratory:  Negative for shortness of breath.   Cardiovascular:  Negative for chest pain.  Gastrointestinal:  Negative for abdominal pain and blood in stool.  Musculoskeletal:  Negative for back pain.  Skin:  Negative for rash.  Neurological:  Negative for headaches.  Psychiatric/Behavioral:  Negative for depression.    Past Medical History:  Diagnosis Date   Anxiety    COVID-19 virus detected    06/04/2019    Depression    Diabetes mellitus without complication (HCC)    Type 2   DKA (diabetic ketoacidoses)    Heart murmur    Hypertension    Sleep apnea    Past Surgical History:  Procedure Laterality Date   lens replacement     03/21/20 left eye right eye 03/2020 Dr. Julian Reil   Family History  Problem Relation Age of Onset   Diabetes Father    Hypertension Father    Diabetes Brother    Hypertension Mother    Hypertension Maternal Grandmother    Social History   Socioeconomic History   Marital status: Married    Spouse name: Not on file   Number of children: Not on file   Years of education: Not on file   Highest education level: Not on file  Occupational History   Not on file  Tobacco Use   Smoking status: Never   Smokeless tobacco: Never  Substance and Sexual Activity   Alcohol use: Yes   Drug use: No   Sexual activity: Not on file  Other Topics Concern   Not on file  Social History Narrative    Works [employer] 05/08/21    Married    2 kids age 18/20 girl and boy as of 05/08/21   Social Determinants of Health   Financial Resource Strain: Not on file  Food Insecurity: Not on file  Transportation Needs: Not on file  Physical Activity: Not on file  Stress: Not on file  Social Connections: Not on file  Intimate Partner Violence: Not on file   Current Meds  Medication Sig   Continuous Blood Gluc Receiver (DEXCOM G6 RECEIVER) DEVI USE READER TO MONITOR BLOOD GLUCOSE CONTINUOUSLY   Continuous Blood Gluc Sensor (DEXCOM G6 SENSOR) MISC CHANGE SENSOR EVERY 10 DAYS AS DIRECTED   Continuous Blood Gluc Transmit (DEXCOM G6 TRANSMITTER) MISC USE 1 TRANSMITTER EVERY 90 DAYS   dapagliflozin propanediol (FARXIGA) 10 MG TABS tablet Take 1 tablet (10 mg total) by mouth daily. D/c jardiance   Insulin Disposable Pump (OMNIPOD DASH 5 PACK PODS) MISC INJECT UNDER THE SKIN EVERY 3 DAYS AS DIRECTED   insulin lispro (HUMALOG) 100 UNIT/ML injection USE MAX OF 80 UNITS PER DAY VIA OMNIPOD INSULIN PUMP   [DISCONTINUED] atorvastatin (LIPITOR) 20 MG tablet Take 1 tablet (20 mg total) by mouth daily.   [DISCONTINUED] carvedilol (COREG) 12.5 MG tablet Take  1 tablet (12.5 mg total) by mouth 2 (two) times daily with a meal.   [DISCONTINUED] Cholecalciferol 1.25 MG (50000 UT) capsule Take 1 capsule (50,000 Units total) by mouth every 30 (thirty) days. D3   [DISCONTINUED] ezetimibe (ZETIA) 10 MG tablet TAKE 1 TABLET(10 MG) BY MOUTH DAILY   [DISCONTINUED] metFORMIN (GLUCOPHAGE-XR) 750 MG 24 hr tablet Take 2 tablets (1,500 mg total) by mouth daily with breakfast.   [DISCONTINUED] Olmesartan-amLODIPine-HCTZ 40-10-12.5 MG TABS TAKE 1 TABLET BY MOUTH DAILY IN THE MORNING   [DISCONTINUED] potassium chloride (KLOR-CON) 10 MEQ tablet TAKE 1 TABLET(10 MEQ) BY MOUTH DAILY   [DISCONTINUED] spironolactone (ALDACTONE) 50 MG tablet TAKE 1 TABLET(50 MG) BY MOUTH DAILY IN THE MORNING   Allergies  Allergen  Reactions   Bee Venom Swelling   Shellfish Allergy     Shrimp and lobster swelling but not severe anaphylaxis     Recent Results (from the past 2160 hour(s))  Hemoglobin A1c     Status: None   Collection Time: 12/31/21 12:00 AM  Result Value Ref Range   Hemoglobin A1C 7.8     Comment: [facility]   Objective  Body mass index is 26.01 kg/m. Wt Readings from Last 3 Encounters:  01/06/22 191 lb 12.8 oz (87 kg)  05/08/21 194 lb 6.4 oz (88.2 kg)  11/05/20 191 lb 6.4 oz (86.8 kg)   Temp Readings from Last 3 Encounters:  01/06/22 98.2 F (36.8 C) (Oral)  05/08/21 97.7 F (36.5 C) (Temporal)  11/05/20 98.4 F (36.9 C) (Oral)   BP Readings from Last 3 Encounters:  01/06/22 126/80  05/08/21 136/88  11/05/20 120/84   Pulse Readings from Last 3 Encounters:  01/06/22 75  05/08/21 67  11/05/20 60    Physical Exam Vitals and nursing note reviewed.  Constitutional:      Appearance: Normal appearance. He is well-developed and well-groomed.  HENT:     Head: Normocephalic and atraumatic.  Eyes:     Conjunctiva/sclera: Conjunctivae normal.     Pupils: Pupils are equal, round, and reactive to light.  Cardiovascular:     Rate and Rhythm: Normal rate and regular rhythm.     Heart sounds: Normal heart sounds.  Pulmonary:     Effort: Pulmonary effort is normal. No respiratory distress.     Breath sounds: Normal breath sounds.  Abdominal:     Tenderness: There is no abdominal tenderness.  Skin:    General: Skin is warm and moist.  Neurological:     General: No focal deficit present.     Mental Status: He is alert and oriented to person, place, and time. Mental status is at baseline.     Sensory: Sensation is intact.     Motor: Motor function is intact.     Coordination: Coordination is intact.     Gait: Gait is intact. Gait normal.  Psychiatric:        Attention and Perception: Attention and perception normal.        Mood and Affect: Mood and affect normal.        Speech:  Speech normal.        Behavior: Behavior normal. Behavior is cooperative.        Thought Content: Thought content normal.        Cognition and Memory: Cognition and memory normal.        Judgment: Judgment normal.     Assessment  Plan  Annual physical exam See below   Hypertension controlled associated with diabetes (  [place]) 12/31/21 A1c 7.8 with [facility] Dr. Honor Junes - Plan: Comprehensive metabolic panel, Lipid panel, CBC with Differential/Platelet, Urinalysis, Routine w reflex microscopic, Microalbumin / creatinine urine ratio Htn controlled with endocrine being c/w hyperaldosteronism on coreg clarify dose 12.5 bid or 25 mg bid, olmesartan-norvasc hctz 40-10/12.5 and spironolactone 50 mg qd with K 10 meq Dm 2 A1c 7.8 12/31/21 had insulin pump on statin clarify lipitor 20 or 40 mg dose and on farxiga 10 mg qd zetia 10, metformin 750 2 pills qd  Thyroid disorder screen - Plan: TSH  Vitamin D deficiency - Plan: Vitamin D (25 hydroxy), Cholecalciferol 1.25 MG (50000 UT) capsule  Hyperlipidemia, unspecified hyperlipidemia type - Plan: atorvastatin (LIPITOR) 20 MG tablet, ezetimibe (ZETIA) 10 MG tablet  Hypokalemia likely due to hyperaldosteronism- Plan: potassium chloride (KLOR-CON) 10 MEQ tablet   HM Flu shot utd 05/08/21   pna 23 vx 1 11/05/20  Consider prevnar 20 in the future   Tdap  12/05/19 Hep A 1/2 had h/o fatty liver 2nd due 08/23/2020 will disc in future  shingrix 1/2 dose today f/u in 2-6 months 2nd dose  moderna 2/2 disc boosters pt considering   Consider MMR vaccine  Hep B immune ho fatty liver check hep A titer not immune see above Declines STD check hiv   Will check hep C today   PSA 0.62 11/05/20 normal   Colonoscopy age 50 y.o disc Dr. Alice Reichert had 10/22/20 hemorrhoids f/u in 10 years    rec healthy diet and exercise    Eye [place] [facility] in [place] [address] dx'ed 05/2020 +left eye glaucoma and placed on drops and s/p surgery lens replacement b/l 02/2020 and  03/2020 sch 11/2020 per pt fax records informed pt  Pt has upcoming appt as of 12/2021 rec fax records   F/u endocrine 12/31/21 [place] f/u sch 05/13/22   Rec healthy diet and exercise    Provider: Dr. Olivia Mackie McLean-Scocuzza-Internal Medicine

## 2022-01-06 NOTE — Patient Instructions (Addendum)
Dr. Clent Ridges PCP  Zoster Vaccine, Recombinant injection x 2 doses 2nd dose before 6 months  What is this medication? ZOSTER VACCINE (ZOS ter vak SEEN) is a vaccine used to reduce the risk of getting shingles. This vaccine is not used to treat shingles or nerve pain from shingles. This medicine may be used for other purposes; ask your health care provider or pharmacist if you have questions. COMMON BRAND NAME(S): [facility] What should I tell my care team before I take this medication? They need to know if you have any of these conditions: cancer immune system problems an unusual or allergic reaction to Zoster vaccine, other medications, foods, dyes, or preservatives pregnant or trying to get pregnant breast-feeding How should I use this medication? This vaccine is injected into a muscle. It is given by a health care provider. A copy of Vaccine Information Statements will be given before each vaccination. Be sure to read this information carefully each time. This sheet may change often. Talk to your health care provider about the use of this vaccine in children. This vaccine is not approved for use in children. Overdosage: If you think you have taken too much of this medicine contact a poison control center or emergency room at once. NOTE: This medicine is only for you. Do not share this medicine with others. What if I miss a dose? Keep appointments for follow-up (booster) doses. It is important not to miss your dose. Call your health care provider if you are unable to keep an appointment. What may interact with this medication? medicines that suppress your immune system medicines to treat cancer steroid medicines like prednisone or cortisone This list may not describe all possible interactions. Give your health care provider a list of all the medicines, herbs, non-prescription drugs, or dietary supplements you use. Also tell them if you smoke, drink alcohol, or use illegal drugs. Some items may  interact with your medicine. What should I watch for while using this medication? Visit your health care provider regularly. This vaccine, like all vaccines, may not fully protect everyone. What side effects may I notice from receiving this medication? Side effects that you should report to your doctor or health care professional as soon as possible: allergic reactions (skin rash, itching or hives; swelling of the face, lips, or tongue) trouble breathing Side effects that usually do not require medical attention (report these to your doctor or health care professional if they continue or are bothersome): chills headache fever nausea pain, redness, or irritation at site where injected tiredness vomiting This list may not describe all possible side effects. Call your doctor for medical advice about side effects. You may report side effects to FDA at 1-800-FDA-1088. Where should I keep my medication? This vaccine is only given by a health care provider. It will not be stored at home. NOTE: This sheet is a summary. It may not cover all possible information. If you have questions about this medicine, talk to your doctor, pharmacist, or health care provider.  2023 Elsevier/Gold Standard (2021-05-16 00:00:00)

## 2022-01-07 LAB — CBC WITH DIFFERENTIAL/PLATELET
Basophils Absolute: 0 10*3/uL (ref 0.0–0.1)
Basophils Relative: 0.8 % (ref 0.0–3.0)
Eosinophils Absolute: 0.4 10*3/uL (ref 0.0–0.7)
Eosinophils Relative: 8.1 % — ABNORMAL HIGH (ref 0.0–5.0)
HCT: 44.2 % (ref 39.0–52.0)
Hemoglobin: 14.5 g/dL (ref 13.0–17.0)
Lymphocytes Relative: 39.7 % (ref 12.0–46.0)
Lymphs Abs: 2 10*3/uL (ref 0.7–4.0)
MCHC: 32.9 g/dL (ref 30.0–36.0)
MCV: 91.6 fl (ref 78.0–100.0)
Monocytes Absolute: 0.2 10*3/uL (ref 0.1–1.0)
Monocytes Relative: 4.8 % (ref 3.0–12.0)
Neutro Abs: 2.4 10*3/uL (ref 1.4–7.7)
Neutrophils Relative %: 46.6 % (ref 43.0–77.0)
Platelets: 182 10*3/uL (ref 150.0–400.0)
RBC: 4.82 Mil/uL (ref 4.22–5.81)
RDW: 13.4 % (ref 11.5–15.5)
WBC: 5.1 10*3/uL (ref 4.0–10.5)

## 2022-01-07 LAB — MICROALBUMIN / CREATININE URINE RATIO
Creatinine,U: 51.3 mg/dL
Microalb Creat Ratio: 1.4 mg/g (ref 0.0–30.0)
Microalb, Ur: <0.7 mg/dL (ref 0.0–1.9)

## 2022-01-07 LAB — URINALYSIS, ROUTINE W REFLEX MICROSCOPIC
Bilirubin Urine: NEGATIVE
Hgb urine dipstick: NEGATIVE
Ketones, ur: NEGATIVE
Leukocytes,Ua: NEGATIVE
Nitrite: NEGATIVE
Protein, ur: NEGATIVE
Specific Gravity, Urine: 1.024 (ref 1.001–1.035)
pH: <=5 (ref 5.0–8.0)

## 2022-01-07 LAB — LIPID PANEL
Cholesterol: 121 mg/dL (ref 0–200)
HDL: 46.2 mg/dL (ref >39.00)
LDL Cholesterol: 39 mg/dL (ref 0–99)
NonHDL: 74.63
Total CHOL/HDL Ratio: 3
Triglycerides: 178 mg/dL — ABNORMAL HIGH (ref 0.0–149.0)
VLDL: 35.6 mg/dL (ref 0.0–40.0)

## 2022-01-07 LAB — COMPREHENSIVE METABOLIC PANEL
ALT: 56 U/L — ABNORMAL HIGH (ref 0–53)
AST: 42 U/L — ABNORMAL HIGH (ref 0–37)
Albumin: 5 g/dL (ref 3.5–5.2)
Alkaline Phosphatase: 51 U/L (ref 39–117)
BUN: 11 mg/dL (ref 6–23)
CO2: 28 mEq/L (ref 19–32)
Calcium: 10.3 mg/dL (ref 8.4–10.5)
Chloride: 101 mEq/L (ref 96–112)
Creatinine, Ser: 1 mg/dL (ref 0.40–1.50)
GFR: 86.7 mL/min (ref >60.00)
Glucose, Bld: 104 mg/dL — ABNORMAL HIGH (ref 70–99)
Potassium: 3.7 mEq/L (ref 3.5–5.1)
Sodium: 139 mEq/L (ref 135–145)
Total Bilirubin: 0.4 mg/dL (ref 0.2–1.2)
Total Protein: 7.9 g/dL (ref 6.0–8.3)

## 2022-01-07 LAB — TSH: TSH: 2.14 u[IU]/mL (ref 0.35–5.50)

## 2022-01-07 LAB — VITAMIN D 25 HYDROXY (VIT D DEFICIENCY, FRACTURES): VITD: 38.04 ng/mL (ref 30.00–100.00)

## 2022-01-07 LAB — PSA: PSA: 0.61 ng/mL (ref 0.10–4.00)

## 2022-01-08 LAB — HEPATITIS C ANTIBODY: Hepatitis C Ab: NONREACTIVE

## 2022-01-19 ENCOUNTER — Other Ambulatory Visit: Payer: Self-pay | Admitting: Internal Medicine

## 2022-01-19 MED ORDER — DAPAGLIFLOZIN PROPANEDIOL 10 MG PO TABS
10.0000 mg | ORAL_TABLET | Freq: Every day | ORAL | 3 refills | Status: DC
Start: 2022-01-19 — End: 2022-07-20

## 2022-03-05 ENCOUNTER — Encounter: Payer: Self-pay | Admitting: Family Medicine

## 2022-03-05 ENCOUNTER — Telehealth: Payer: Self-pay

## 2022-03-05 ENCOUNTER — Telehealth (INDEPENDENT_AMBULATORY_CARE_PROVIDER_SITE_OTHER): Payer: BC Managed Care – PPO | Admitting: Family Medicine

## 2022-03-05 DIAGNOSIS — R059 Cough, unspecified: Secondary | ICD-10-CM

## 2022-03-05 DIAGNOSIS — J069 Acute upper respiratory infection, unspecified: Secondary | ICD-10-CM | POA: Diagnosis not present

## 2022-03-05 LAB — POC COVID19 BINAXNOW: SARS Coronavirus 2 Ag: NEGATIVE

## 2022-03-05 LAB — POCT INFLUENZA A/B

## 2022-03-05 NOTE — Addendum Note (Signed)
Addended by: Enid Cutter on: 03/05/2022 01:10 PM   Modules accepted: Orders

## 2022-03-05 NOTE — Progress Notes (Signed)
[facility] Telemedicine Visit  Patient consented to have virtual visit and was identified by name and date of birth. Method of visit: Video  Encounter participants: Patient: Craig Johnson - located at home Provider: Dana Allan - located at office Others (if applicable): none  Chief Complaint: cough and fever  HPI:  Primary symptom: cough, fever Duration: 2 days Associated symptoms: sweats, body aches, cough, runny nose, fatigue, sore throat Fever? Tmax?: 101 Sick contacts:unknown Covid test:no Covid vaccination(s):yes Denies any shortness of breath, chest pain, nausea, vomiting.  Hydrating well.  Has had a decrease in appetite since symptoms started.   ROS: per HPI  Pertinent PMHx:  DM Type 2 HTN  Exam:  There were no vitals taken for this visit.  Respiratory: speaking in full sentences, no increased work of breathing, sounds nasally congested.  Intermittent dry cough during visit  Assessment/Plan:  Viral URI with cough 2 days cough, fever, fatigue and body aches.  No known contacts.  Not in any respiratory distress.  Unable to perform lung exam given nature of visit. Suspect viral etiology. -COVID, Flu A/B, RSV today.  Patient aware to come to clinic and call to be tested. -If COVID positive, can start Paxlovid 150 mg BID and 100 mg BID x 5 days, GFR 86.7 -If Flu positive, can start Tamiflu 75 mg BID x 5 days -Follow up with results -Continue symptom management -Strict ED/return precautions provided    Time spent during visit with patient: 15 minutes

## 2022-03-05 NOTE — Telephone Encounter (Signed)
Spoke to Patient to let him know that his Covid and flu A & B was negative. Dr. Clent Ridges said it is probably a viral infection but if the Patient runs a fever over 101 or develops any more symptoms to be seen and rule out Pneumonia. Patient verbalized understanding and is agreeable.

## 2022-03-05 NOTE — Assessment & Plan Note (Addendum)
2 days cough, fever, fatigue and body aches.  No known contacts.  Not in any respiratory distress.  Unable to perform lung exam given nature of visit. Suspect viral etiology. -COVID, Flu A/B, RSV today.  Patient aware to come to clinic and call to be tested. -If COVID positive, can start Paxlovid 150 mg BID and 100 mg BID x 5 days, GFR 86.7 -If Flu positive, can start Tamiflu 75 mg BID x 5 days -Follow up with results -Continue symptom management -Strict ED/return precautions provided

## 2022-03-05 NOTE — Patient Instructions (Signed)
It was a pleasure meeting you today. Thank you for allowing me to take part in your health care.  Our goals for today as we discussed include:  For your cough and fever Please come by to get testing for COVID and influenza      Please follow-up with PCP in 3 months or sooner if needed  If you have any questions or concerns, please do not hesitate to call the office at [phone].  I look forward to our next visit and until then take care and stay safe.  Regards,   Dana Allan, MD   [facility]

## 2022-03-05 NOTE — Telephone Encounter (Signed)
Called Patient to let him know he can come around 1:15 and pull around back to the second door and call the number and someone will be out to run the tests that Dr. Clent Ridges ordered on him. Patient verbalized understanding and is agreeable.

## 2022-03-05 NOTE — Progress Notes (Signed)
Negative for COVID and flu.  Likely viral but if having worsening symptoms can go to urgent care or schedule office appointment.

## 2022-03-11 ENCOUNTER — Ambulatory Visit (INDEPENDENT_AMBULATORY_CARE_PROVIDER_SITE_OTHER): Payer: BC Managed Care – PPO

## 2022-03-11 DIAGNOSIS — Z23 Encounter for immunization: Secondary | ICD-10-CM

## 2022-03-11 NOTE — Progress Notes (Signed)
Pt arrived for 2nd shingles shot, given in L deltoid. Pt tolerated injection well, showed no signs of distress nor voiced any concerns.

## 2022-04-13 ENCOUNTER — Telehealth: Payer: BC Managed Care – PPO | Admitting: Emergency Medicine

## 2022-04-13 DIAGNOSIS — M545 Low back pain, unspecified: Secondary | ICD-10-CM

## 2022-04-13 MED ORDER — CYCLOBENZAPRINE HCL 10 MG PO TABS: 10.0000 mg | ORAL_TABLET | Freq: Three times a day (TID) | ORAL | 0 refills | Status: DC | PRN

## 2022-04-13 NOTE — Progress Notes (Signed)
Virtual Visit Consent   [name], you are scheduled for a virtual visit with a [facility] provider today. Just as with appointments in the office, your consent must be obtained to participate. Your consent will be active for this visit and any virtual visit you may have with one of our providers in the next 365 days. If you have a MyChart account, a copy of this consent can be sent to you electronically.  As this is a virtual visit, video technology does not allow for your provider to perform a traditional examination. This may limit your provider's ability to fully assess your condition. If your provider identifies any concerns that need to be evaluated in person or the need to arrange testing (such as labs, EKG, etc.), we will make arrangements to do so. Although advances in technology are sophisticated, we cannot ensure that it will always work on either your end or our end. If the connection with a video visit is poor, the visit may have to be switched to a telephone visit. With either a video or telephone visit, we are not always able to ensure that we have a secure connection.  By engaging in this virtual visit, you consent to the provision of healthcare and authorize for your insurance to be billed (if applicable) for the services provided during this visit. Depending on your insurance coverage, you may receive a charge related to this service.  I need to obtain your verbal consent now. Are you willing to proceed with your visit today? Craig Johnson has provided verbal consent on 04/13/2022 for a virtual visit (video or telephone). Montine Circle, PA-C  Date: 04/13/2022 7:54 AM  Virtual Visit via Video Note   I, Montine Circle, connected with  Gunther Zawadzki  ([id number], Oct 10, 1970) on 04/13/22 at  7:45 AM EDT by a video-enabled telemedicine application and verified that I am speaking with the correct person using two identifiers.  Location: Patient: Virtual Visit Location  Patient: Home Provider: Virtual Visit Location Provider: Home   I discussed the limitations of evaluation and management by telemedicine and the availability of in person appointments. The patient expressed understanding and agreed to proceed.    History of Present Illness: Craig Johnson is a 51 y.o. who identifies as a male who was assigned male at birth, and is being seen today for back pain.  States that he was helping a friend move yesterday.  Reports having bad low back pain and down his left side.  Improved some today.  Has tried taking Aleve with mild relief.  Denies any incontinence.  Is able to walk, but it hurts.  Denies fever.  Thinks he over did it yesterday.  HPI: HPI  Problems:  Patient Active Problem List   Diagnosis Date Noted   Viral URI with cough 03/05/2022   Gallbladder sludge 01/07/2021   AAA (abdominal aortic aneurysm) ([place]) 01/07/2021   Hyperaldosteronism ([place]) 11/05/2020   Hypertension associated with diabetes ([place]) 12/05/2019   Glaucoma of left eye 08/01/2019   Arthritis 08/01/2019   Fatty liver 08/01/2019   Annual physical exam 02/01/2019   Hypokalemia 10/04/2018   Acute pain of left shoulder 04/01/2018   Erectile dysfunction 08/25/2016   GERD (gastroesophageal reflux disease) 03/17/2015   Type 2 diabetes mellitus with hyperglycemia, with long-term current use of insulin ([place]) 05/02/2014   HTN (hypertension) 05/02/2014   Hyperlipidemia 05/02/2014   Mitral valve regurgitation 05/02/2014    Allergies:  Allergies  Allergen Reactions   Bee Venom Swelling  Shellfish Allergy     Shrimp and lobster swelling but not severe anaphylaxis     Medications:  Current Outpatient Medications:    atorvastatin (LIPITOR) 20 MG tablet, Take 1 tablet (20 mg total) by mouth daily., Disp: 90 tablet, Rfl: 3   carvedilol (COREG) 12.5 MG tablet, Take 1 tablet (12.5 mg total) by mouth 2 (two) times daily with a meal., Disp: 180 tablet, Rfl: 3   Cholecalciferol 1.25 MG  (50000 UT) capsule, Take 1 capsule (50,000 Units total) by mouth every 30 (thirty) days. D3, Disp: 13 capsule, Rfl: 3   Continuous Blood Gluc Receiver (DEXCOM G6 RECEIVER) DEVI, USE READER TO MONITOR BLOOD GLUCOSE CONTINUOUSLY, Disp: 1 each, Rfl: 1   Continuous Blood Gluc Sensor (DEXCOM G6 SENSOR) MISC, CHANGE SENSOR EVERY 10 DAYS AS DIRECTED, Disp: 9 each, Rfl: 9   Continuous Blood Gluc Transmit (DEXCOM G6 TRANSMITTER) MISC, USE 1 TRANSMITTER EVERY 90 DAYS, Disp: 1 each, Rfl: 3   dapagliflozin propanediol (FARXIGA) 10 MG TABS tablet, Take 1 tablet (10 mg total) by mouth daily. D/c jardiance, Disp: 90 tablet, Rfl: 3   ezetimibe (ZETIA) 10 MG tablet, TAKE 1 TABLET(10 MG) BY MOUTH DAILY, Disp: 90 tablet, Rfl: 3   Insulin Disposable Pump (OMNIPOD DASH 5 PACK PODS) MISC, INJECT UNDER THE SKIN EVERY 3 DAYS AS DIRECTED, Disp: 15 each, Rfl: 2   insulin lispro (HUMALOG) 100 UNIT/ML injection, USE MAX OF 80 UNITS PER DAY VIA OMNIPOD INSULIN PUMP, Disp: 30 mL, Rfl: 3   metFORMIN (GLUCOPHAGE-XR) 750 MG 24 hr tablet, Take 2 tablets (1,500 mg total) by mouth daily with breakfast., Disp: 180 tablet, Rfl: 3   Olmesartan-amLODIPine-HCTZ 40-10-12.5 MG TABS, Take 1 tablet by mouth every morning., Disp: 90 tablet, Rfl: 3   potassium chloride (KLOR-CON) 10 MEQ tablet, Take 1 tablet (10 mEq total) by mouth daily., Disp: 90 tablet, Rfl: 3   spironolactone (ALDACTONE) 50 MG tablet, TAKE 1 TABLET(50 MG) BY MOUTH DAILY IN THE MORNING, Disp: 90 tablet, Rfl: 3  Observations/Objective: Patient is well-developed, well-nourished in no acute distress.  Resting comfortably  at home.  Head is normocephalic, atraumatic.  No labored breathing.  Speech is clear and coherent with logical content.  Patient is alert and oriented at baseline.    Assessment and Plan: 1. Acute left-sided low back pain, unspecified whether sciatica present  Symptoms seem consistent with muscle strain/spasm.  Able to ambulate.  No concerning symptoms  for cauda equina. Meds ordered this encounter  Medications   cyclobenzaprine (FLEXERIL) 10 MG tablet    Sig: Take 1 tablet (10 mg total) by mouth 3 (three) times daily as needed for muscle spasms.    Dispense:  10 tablet    Refill:  0    Order Specific Question:   Supervising Provider    Answer:   Chase Picket [id number]       Follow Up Instructions: I discussed the assessment and treatment plan with the patient. The patient was provided an opportunity to ask questions and all were answered. The patient agreed with the plan and demonstrated an understanding of the instructions.  A copy of instructions were sent to the patient via MyChart unless otherwise noted below.    The patient was advised to call back or seek an in-person evaluation if the symptoms worsen or if the condition fails to improve as anticipated.  Time:  I spent 13 minutes with the patient via telehealth technology discussing the above problems/concerns.    Montine Circle, PA-C

## 2022-05-11 ENCOUNTER — Encounter: Payer: Self-pay | Admitting: Family Medicine

## 2022-05-13 DIAGNOSIS — E1169 Type 2 diabetes mellitus with other specified complication: Secondary | ICD-10-CM | POA: Diagnosis not present

## 2022-05-13 DIAGNOSIS — E785 Hyperlipidemia, unspecified: Secondary | ICD-10-CM | POA: Diagnosis not present

## 2022-05-13 DIAGNOSIS — I152 Hypertension secondary to endocrine disorders: Secondary | ICD-10-CM | POA: Diagnosis not present

## 2022-05-13 DIAGNOSIS — E269 Hyperaldosteronism, unspecified: Secondary | ICD-10-CM | POA: Diagnosis not present

## 2022-06-05 NOTE — Telephone Encounter (Signed)
Patient is scheduled to see provider.  Angelica Frandsen,cma  

## 2022-07-09 ENCOUNTER — Encounter: Payer: Self-pay | Admitting: Family Medicine

## 2022-07-20 ENCOUNTER — Telehealth: Payer: Self-pay | Admitting: Nurse Practitioner

## 2022-07-20 ENCOUNTER — Telehealth: Payer: BC Managed Care – PPO | Admitting: Physician Assistant

## 2022-07-20 DIAGNOSIS — U071 COVID-19: Secondary | ICD-10-CM

## 2022-07-20 MED ORDER — MOLNUPIRAVIR EUA 200MG CAPSULE: 4.0000 | ORAL_CAPSULE | Freq: Two times a day (BID) | ORAL | 0 refills | Status: AC

## 2022-07-20 NOTE — Progress Notes (Signed)
   Thank you for the details you included in the comment boxes. Those details are very helpful in determining the best course of treatment for you and help [place] to provide the best care.Because you desire anti-viral treatment, we recommend that you convert this visit to a video visit in order for the provider to better assess what is going on.  The provider will be able to give you a more accurate diagnosis and treatment plan if we can more freely discuss your symptoms and with the addition of a virtual examination.   If you convert to a video visit, we will bill your insurance (similar to an office visit) and you will not be charged for this e-Visit. You will be able to stay at home and speak with the first available [facility] advanced practice provider. The link to do a video visit is in the drop down Menu tab of your Welcome screen in [place].  I have spent 5 minutes in review of e-visit questionnaire, review and updating patient chart, medical decision making and response to patient.   Mar Daring, PA-C

## 2022-07-20 NOTE — Progress Notes (Signed)
Virtual Visit Consent   Craig Johnson, you are scheduled for a virtual visit with a [facility] provider today. Just as with appointments in the office, your consent must be obtained to participate. Your consent will be active for this visit and any virtual visit you may have with one of our providers in the next 365 days. If you have a MyChart account, a copy of this consent can be sent to you electronically.  As this is a virtual visit, video technology does not allow for your provider to perform a traditional examination. This may limit your provider's ability to fully assess your condition. If your provider identifies any concerns that need to be evaluated in person or the need to arrange testing (such as labs, EKG, etc.), we will make arrangements to do so. Although advances in technology are sophisticated, we cannot ensure that it will always work on either your end or our end. If the connection with a video visit is poor, the visit may have to be switched to a telephone visit. With either a video or telephone visit, we are not always able to ensure that we have a secure connection.  By engaging in this virtual visit, you consent to the provision of healthcare and authorize for your insurance to be billed (if applicable) for the services provided during this visit. Depending on your insurance coverage, you may receive a charge related to this service.  I need to obtain your verbal consent now. Are you willing to proceed with your visit today? Craig Johnson has provided verbal consent on 07/20/2022 for a virtual visit (video or telephone). Apolonio Schneiders, FNP  Date: 07/20/2022 2:12 PM  Virtual Visit via Video Note   I, Apolonio Schneiders, connected with  Craig Johnson  ([id number], May 03, 1971) on 07/20/22 at  2:15 PM EST by a video-enabled telemedicine application and verified that I am speaking with the correct person using two identifiers.  Location: Patient: Virtual Visit Location Patient:  Home Provider: Virtual Visit Location Provider: Home Office   I discussed the limitations of evaluation and management by telemedicine and the availability of in person appointments. The patient expressed understanding and agreed to proceed.    History of Present Illness: Craig Johnson is a 52 y.o. who identifies as a male who was assigned male at birth, and is being seen today for [place].  He started to feel sick 3 days ago Symptoms include fatigue, nasal congestion and a cough  Wife also had COVID   He did have COVID once in the past without complications   He has been vaccinated for COVID in the past X2    Denies a history of asthma   He has a history of HTN and DM    Denies a history of kidney disease GFR 86 Problems:  Patient Active Problem List   Diagnosis Date Noted   Viral URI with cough 03/05/2022   Gallbladder sludge 01/07/2021   AAA (abdominal aortic aneurysm) ([place]) 01/07/2021   Hyperaldosteronism ([place]) 11/05/2020   Hypertension associated with diabetes ([place]) 12/05/2019   Glaucoma of left eye 08/01/2019   Arthritis 08/01/2019   Fatty liver 08/01/2019   Annual physical exam 02/01/2019   Hypokalemia 10/04/2018   Acute pain of left shoulder 04/01/2018   Erectile dysfunction 08/25/2016   GERD (gastroesophageal reflux disease) 03/17/2015   Type 2 diabetes mellitus with hyperglycemia, with long-term current use of insulin ([place]) 05/02/2014   HTN (hypertension) 05/02/2014   Hyperlipidemia 05/02/2014   Mitral valve regurgitation 05/02/2014  Allergies:  Allergies  Allergen Reactions   Bee Venom Swelling   Shellfish Allergy     Shrimp and lobster swelling but not severe anaphylaxis     Medications:    Current Outpatient Medications:    Dapagliflozin Pro-metFORMIN ER 10-998 MG TB24, Take 2 tablets by mouth daily with breakfast., Disp: , Rfl:    fenofibrate (TRICOR) 145 MG tablet, Take by mouth., Disp: , Rfl:    insulin aspart (NOVOLOG) 100 UNIT/ML  injection, USE MAX OF 80 UNITS PER DAY VIA OMNIPOD INSULIN PUMP, Disp: , Rfl:    atorvastatin (LIPITOR) 20 MG tablet, Take 1 tablet (20 mg total) by mouth daily., Disp: 90 tablet, Rfl: 3   carvedilol (COREG) 12.5 MG tablet, Take 1 tablet (12.5 mg total) by mouth 2 (two) times daily with a meal., Disp: 180 tablet, Rfl: 3   Cholecalciferol 1.25 MG (50000 UT) capsule, Take 1 capsule (50,000 Units total) by mouth every 30 (thirty) days. D3, Disp: 13 capsule, Rfl: 3   Continuous Blood Gluc Receiver (DEXCOM G6 RECEIVER) DEVI, USE READER TO MONITOR BLOOD GLUCOSE CONTINUOUSLY, Disp: 1 each, Rfl: 1   Continuous Blood Gluc Sensor (DEXCOM G6 SENSOR) MISC, CHANGE SENSOR EVERY 10 DAYS AS DIRECTED, Disp: 9 each, Rfl: 9   Continuous Blood Gluc Transmit (DEXCOM G6 TRANSMITTER) MISC, USE 1 TRANSMITTER EVERY 90 DAYS, Disp: 1 each, Rfl: 3   ezetimibe (ZETIA) 10 MG tablet, TAKE 1 TABLET(10 MG) BY MOUTH DAILY, Disp: 90 tablet, Rfl: 3   Insulin Disposable Pump (OMNIPOD DASH 5 PACK PODS) MISC, INJECT UNDER THE SKIN EVERY 3 DAYS AS DIRECTED, Disp: 15 each, Rfl: 2   insulin lispro (HUMALOG) 100 UNIT/ML injection, USE MAX OF 80 UNITS PER DAY VIA OMNIPOD INSULIN PUMP, Disp: 30 mL, Rfl: 3   Olmesartan-amLODIPine-HCTZ 40-10-12.5 MG TABS, Take 1 tablet by mouth every morning., Disp: 90 tablet, Rfl: 3   potassium chloride (KLOR-CON) 10 MEQ tablet, Take 1 tablet (10 mEq total) by mouth daily., Disp: 90 tablet, Rfl: 3   spironolactone (ALDACTONE) 50 MG tablet, TAKE 1 TABLET(50 MG) BY MOUTH DAILY IN THE MORNING, Disp: 90 tablet, Rfl: 3   Observations/Objective: Patient is well-developed, well-nourished in no acute distress.  Resting comfortably  at home.  Head is normocephalic, atraumatic.  No labored breathing.  Speech is clear and coherent with logical content.  Patient is alert and oriented at baseline.    Assessment and Plan: 1. COVID-19  - molnupiravir EUA (LAGEVRIO) 200 mg CAPS capsule; Take 4 capsules (800 mg total)  by mouth 2 (two) times daily for 5 days.  Dispense: 40 capsule; Refill: 0    Continue to manage symptoms with over the counter medications as discussed Coricidin HBP  Push fluids Rest Assure caloric intake  Close watch on glucose levels    Follow Up Instructions: I discussed the assessment and treatment plan with the patient. The patient was provided an opportunity to ask questions and all were answered. The patient agreed with the plan and demonstrated an understanding of the instructions.  A copy of instructions were sent to the patient via MyChart unless otherwise noted below.    The patient was advised to call back or seek an in-person evaluation if the symptoms worsen or if the condition fails to improve as anticipated.  Time:  I spent 18 minutes with the patient via telehealth technology discussing the above problems/concerns.    Apolonio Schneiders, FNP

## 2022-10-20 ENCOUNTER — Encounter: Payer: Self-pay | Admitting: Family Medicine

## 2022-10-20 NOTE — Telephone Encounter (Signed)
Spoke to pt  in regards to his message and he stated that he was really stressed out at work and going through a lot .Pt stated that he did not think that I could help him but he needed to speak to someone professionally I stated to him that I needed  to go over depression screening questions first to determine who I needed to put him in contact with and Pt  denied the questionnaire and stated that he had a meeting to get to. Pt stated that he would be alright.

## 2022-10-20 NOTE — Telephone Encounter (Signed)
Called pt to further evaluate his symptoms and relay information that Dr. Clent Ridges had given.  Pt did not have time to talk.  Let him know that I would send him a MyChart message with the address and telephone number.

## 2022-10-23 ENCOUNTER — Ambulatory Visit (HOSPITAL_COMMUNITY)
Admission: EM | Admit: 2022-10-23 | Discharge: 2022-10-23 | Disposition: A | Discharge status: Home / Self Care | Payer: Commercial Managed Care - PPO

## 2022-10-23 DIAGNOSIS — F4320 Adjustment disorder, unspecified: Secondary | ICD-10-CM

## 2022-10-23 NOTE — Progress Notes (Signed)
   10/23/22 1107  [facility] Triage Screening (Walk-ins at [facility] only)  How Did You Hear About [place]? Self  What Is the Reason for Your Visit/Call Today? Pt is a 52 yo male who presented voluntarily and unaccompanied due to "feeling overwhemed" at work and at home. Pt denied SI, HI, NAAH, AVH, paranoia and any substance use except daily alcohol use. Pt stated he usually has 2 beers at night to help him sleep but stated "it's not a problem." Pt has no OP psychiatric providers and stated he has never been diagnosed with a mental health condition.  How Long Has This Been Causing You Problems? 1-6 months  Have You Recently Had Any Thoughts About Hurting Yourself? No  Are You Planning to Commit Suicide/Harm Yourself At This time? No  Have you Recently Had Thoughts About Hurting Someone Karolee Ohs? No  Are You Planning To Harm Someone At This Time? No  Are you currently experiencing any auditory, visual or other hallucinations? No  Have You Used Any Alcohol or Drugs in the Past 24 Hours? Yes  How long ago did you use Drugs or Alcohol? alcohol last night  What Did You Use and How Much? 2 beers  Do you have any current medical co-morbidities that require immediate attention? No  Clinician description of patient physical appearance/behavior: calm, cooperative, alert, oriented. casually and neatly dressed; euthymic mood and responsive affect. insight and judgment seemed fair to good. speech and movement were within normla limits.  What Do You Feel Would Help You the Most Today? Treatment for Depression or other mood problem  If access to [facility] was not available, would you have sought care in the Emergency Department? No  Determination of Need Routine (7 days)  Options For Referral Medication Management;Outpatient Therapy

## 2022-10-23 NOTE — ED Notes (Signed)
Patient arrived to room.

## 2022-10-23 NOTE — Discharge Instructions (Addendum)
Based on the information that you have provided and the presenting issues outpatient services and resources for have been recommended.  It is imperative that you follow through with treatment recommendations within 5-7 days from the of discharge to mitigate further risk to your safety and mental well-being. A list of referrals has been provided below to get you started.  You are not limited to the list provided and this list does not reflect all of the providers that accept [employer] and [employer].  As always, you first consult your insurance provider with Valinda Hoar and Shield due to changes in insurance plans to ensure that the providers that you choose accepts your specific Express Scripts plan. .  In case of an urgent crisis, you may contact the Mobile Crisis Unit with [facility] at [phone].                                                                                           Medication Management and Outpatient Therapists   [facility] [address] [place], [place], [address] [phone] phone [phone] phone ([address], Anthem/Elevance, [address], [address], [address], [address], Healthy [place], [place], [place], [address], ConocoPhillips, [place], [facility], American Financial, [place], Out of Network)  [employer], [place] [address] [place], [place], [address] [phone] phone ([place], Anthem/Elevance, Sanmina-SCI Options/Carelon, BCBS, [address], [place], [place], [place], [place], Medicare, [place], [place], [place], [facility])   [facility] [address], [address] [place], [place], [address] [phone] phone [phone] fax ([address], [place] , [place], [place], [place] Health Choice, UHC, General Electric, Self-Pay)  [facility] [address] [place], [place], [address] [phone] phone ([address] Anthem/Elevance, [address], [address], [place], CSX Corporation, [place], [place], [place], Har[employer] Entertainment, [place], [place], [place], [facility])  [facility] - 6-8 MONTH WAIT FOR THERAPY; SOONER FOR MEDICATION MANAGEMENT [address], [address] [place], [place], [address] [phone] phone ([address], AmeriHealth [address] - [place], [address], [place], [place], Friday Health Plans, [address], BCBS Healthy [place], [place], [address], [place], [place], [place], [place], Tricare, [facility], Safeco Corporation, Heritage manager)  [facility] [employer] [address] [place], [place] [address]  [phone]  [facility]  [facility] [address] [place], [place] [address]  [phone]   [facility] [address] [place], [place], [address] [phone] phone  [facility] [address], [address] [place], [place], [address] [phone] phone  [facility] [address] [place], [place], [address] [phone] phone     .Marland Kitchen   Based on the information that you have provided and the presenting issues outpatient services and resources for have been recommended.  It is imperative that you follow through with treatment recommendations within 5-7 days from the of discharge to mitigate further risk to your safety and mental well-being. A list of referrals has been provided below to get you started.  You are not limited to the list provided.  In case of an urgent crisis, you may contact  the Mobile Crisis Unit with [facility] at [phone].   Therapists that accept [employer] and [facility]  [address], [place], [place] [address]p:[phone] fax:[phone] Elita Boone, LCMHUC-S    Our Practice  Psychotherapy, which is also often referred to as talk therapy, is a mental health treatment that addresses the difficulties one encounters as  the result of mental illness, emotional trauma, adjustment difficulties, and a wide variety of mental health challenges. Psychotherapy is a treatment that is intended to assist a person in controlling and eliminating negative symptoms that have a problematic impact on one's daily functioning and thought processes. The desired outcome is improved functioning, increased well-being, and emotional healing.    Our Treatment Focus Our focus is to help individuals heal, energize, and become aware of their inner strengths. We achieve this by providing a neutral safe space, listening to your concerns, and customizing a treatment plan.   Our Patient Promise We promise to be there for you every step of your journey. Our goal is to help you grow from your struggles, heal from your pain, and move forward to where you want to be in your life.             Based on the information you have provided and the presenting issue, outpatient services and resources for have been recommended.  It is imperative that you follow through with treatment recommendations within 5-7 days from the of discharge to mitigate further risk to your safety and mental well-being. A list of referrals has been provided below to get you started.  You are not limited to the list provided.  In case of an urgent crisis, you may contact the Mobile Crisis Unit with [facility] at [phone].   Frederic Jericho, MSW, [place], [facility] [phone] [place], [place] [address] Office [phone] Fax [phone]   I am pleased that you have selected me as your therapist.  I have a Child psychotherapist of Social Work degree, obtained at the [employer] [place] in 2000.  I am licensed with the [facility] Social Work Public librarian [phone]).   The services offered to you include individual, family, and group counseling.  My therapeutic approach  is derived from my training and experience  in Cognitive-Behavioral Therapy, Solution Focused Therapy, Reality Therapy, as well as, interpersonal and developmental theories of counseling.  I have special interests in anxiety, depression, sexual and physical abuse, play therapy, child and adolescent development, and family systems.  I am specifically trained in Trauma Focused-Cognitive Behavioral Therapy (TF-CBT).  I will not discriminate because of age, race, gender, sexual orientation or religion.  If there is anything I should know concerning your culture or religion, I ask that you please inform me so that I can better understand you.  If for any reason, I determine that my knowledge and expertise are not sufficient for your particular needs, I will make every effort to refer you to another counselor who is prepared to work more effectively with you.  Are you or a loved one dealing with the aftermath of a traumatic experience? Do life transitions leave you grappling with feelings of depression or anxiety? Are you concerned about your child's negative behaviors, sleep disturbances, or difficulties during a divorce? It's important to understand that children primarily operate based on emotions, while adults tend to use rational thinking. This emotional focus can impact a child's ability to make  effective decisions and express their feelings.   Services Individual Counseling  Individual counseling is a personal opportunity to receive support and experience growth during challenging times in life. Individual counseling can help one deal with many personal topics in life such as anger, depression, anxiety, substance abuse, marriage and relationship challenges, parenting problems, school difficulties, career changes, etc.  Family Counseling  Families often are faced with issues and challenges that greatly impact all the members. The goal of family therapy is to renew and maintain the natural family structure and support group with an emphasis on  returning the individual to a healthy family and community life. Family counseling can help improve communication, resolve conflict, and improve connections. [/one-half-first]    Parenting Coordinator Parent Coordinator is a neutral third party acting in the children's best interest attempting to reach a fair compromise of the issues at hand. Assisting parents manage their parenting plan, improve communication, and resolve disputes. A court appointment neutral party.     Treatment specialization includes: Depression  Anxiety  Attention Deficit/Hyperactive Disorder  Grief Counseling  Conflict Resolution  Emotional Regulation  Family Counseling  Divorce/Separation  Mediation  Conflict Resolution  Relationship Building  Parenting Coordinator  Parental Support    Base on the information you have provided and the presenting issue, outpatient services and resources for have been recommended.  It is imperative that you follow through with treatment recommendations within 5-7 days from the of discharge to mitigate further risk to your safety and mental well-being. A list of referrals has been provided below to get you started.  You are not limited to the list provided.  In case of an urgent crisis, you may contact the Mobile Crisis Unit with [facility] at [phone].            Therapists that accept [employer] and [employer] [address] [place], [place] [address]  [phone]  Are you feeling "some kind of way", unsettled in yourself, or having a difficult time identifying how to navigate through your world. You are not alone. We all encounter times when the life we are living becomes overwhelming and we wish for someone to hear the things we are feeling inside but are unable to express. We are looking to understand why these things are happening to [place], and how to feel at peace  with ourselves. We want to know that these negative feelings will not always be with [place]. No, I do not have a magic wand that will solve all your worries.What I offer is the opportunity to listen to your story and help you develop  As a graduate of [employer], with a Masters of arts in counseling psychology, I serve a diverse population while personalizing care. The skills used come from art therapy, play therapy, CBT(Cognitive Behavioral Therapy), EMDR, and other specialized therapeutic interventions. I cater to my client's needs while using best practices. I started my counseling practice with the desire of "helping others heal" from the conflict they have faced in life and their personal struggles. I have had the honor of assisting people overcome many of their life struggles and to move forward and enjoy the life that they wish to have. Give me a call and let me know how I can help you.   Patient is instructed prior to discharge to:  Take all medications as prescribed by his/her mental healthcare provider. Report any adverse effects and or reactions  from the medicines to his/her outpatient provider promptly. Keep all scheduled appointments, to ensure that you are getting refills on time and to avoid any interruption in your medication.  If you are unable to keep an appointment call to reschedule.  Be sure to follow-up with resources and follow-up appointments provided.  Patient has been instructed & cautioned: To not engage in alcohol and or illegal drug use while on prescription medicines. In the event of worsening symptoms, patient is instructed to call the crisis hotline, 911 and or go to the nearest ED for appropriate evaluation and treatment of symptoms. To follow-up with his/her primary care provider for your other medical issues, concerns and or health care needs.  Information: -National Suicide Prevention Lifeline 1-800-SUICIDE or 929-618-3763.  -988 offers 24/7 access to  trained crisis counselors who can help people experiencing mental health-related distress. People can call or text 988 or chat 988lifeline.org for themselves or if they are worried about a loved one who may need crisis support.

## 2022-10-23 NOTE — ED Provider Notes (Signed)
Behavioral Health Urgent Care Medical Screening Exam  Patient Name: Craig Johnson MRN: [id number] Date of Evaluation: 10/23/22 Chief Complaint:   Diagnosis:  Final diagnoses:  Adjustment disorder, unspecified type    History of Present illness: Craig Johnson is a 52 y.o. male.  Patient presents voluntarily to [facility] behavioral health for walk-in assessment.  Patient is assessed by this nurse practitioner face-to-face.  He is alert and oriented, pleasant and cooperative during assessment.  Chart reviewed and patient discussed with Dr. Nelly Rout on 10/23/2022.  Patient presents with euthymic mood, congruent affect.  Patient states "I work in Community education officer, I am an Barrister's clerk, I have been feeling overwhelmed at work and I think life is just getting to me right now."  Patient reports over the past 2 days he has been increasing on the affected by incidences occurring at work.  Patient states "I am an emotional person, even though I am quiet, and when things happen to people it affects me."  Patient describes an incident that occurred on yesterday when a patient's insurance claim was not covered.  Craig Johnson denies mental health history.  No current medications to address mood.  He states "I would like to have somebody to talk to on a regular basis."  Patient willing to follow-up with employer EAP program for counseling assistance while securing outpatient psychiatry follow-up.  He denies history of inpatient psychiatric hospitalization.  Family mental health history includes patient's father who was diagnosed with alcohol use disorder.  Patient denies suicidal and homicidal ideations.  He denies history of suicide attempts, denies history of nonsuicidal self-harm behavior.  He easily contracts verbally for safety with this Clinical research associate.  Patient denies auditory and visual hallucinations.  There is no evidence of delusional thought content no indication that patient is responding to internal stimuli.   He denies symptoms of paranoia.  Craig Johnson resides in [place] with his wife and 2 adult daughters.  He denies access to weapons.  He is employed in [employer]	[place].  He endorses average appetite and sleeps an average of 5 hours per night.  Reports average of 5 to 6 hours per night for many years.  He denies substance use aside from alcohol.  He consumes 1 to 2 forty ounce beers daily.  Declines substance use treatment options at this time states "I am okay."  Patient declines any person to contact for collateral information at this time.  He verbalizes understanding of strict return precautions.   Patient educated and verbalizes understanding of mental health resources and other crisis services in the community. They are instructed to call 911 and present to the nearest emergency room should patient experience any suicidal/homicidal ideation, auditory/visual/hallucinations, or detrimental worsening of mental health condition.     Flowsheet Row ED from 10/23/2022 in [facility]  C-SSRS RISK CATEGORY No Risk       Psychiatric Specialty Exam  Presentation  General Appearance:Appropriate for Environment; Casual  Eye Contact:Good  Speech:Clear and Coherent; Normal Rate  Speech Volume:Normal  Handedness:Right   Mood and Affect  Mood: Euthymic  Affect: Appropriate; Congruent   Thought Process  Thought Processes: Coherent; Goal Directed; Linear  Descriptions of Associations:Intact  Orientation:Full (Time, Place and Person)  Thought Content:Logical; WDL    Hallucinations:None  Ideas of Reference:None  Suicidal Thoughts:No  Homicidal Thoughts:No   Sensorium  Memory: Immediate Good; Recent Good  Judgment: Good  Insight: Good   Executive Functions  Concentration: Good  Attention Span: Good  Recall: Peri [place]  Fund  of Knowledge: Good  Language: Good   Psychomotor Activity  Psychomotor Activity: Normal   Assets   Assets: Communication Skills; Desire for Improvement; Financial Resources/Insurance; Housing; Leisure Time; Physical Health; Resilience; Social Support   Sleep  Sleep: Fair  Number of hours:  5   Physical Exam: Physical Exam Vitals and nursing note reviewed.  Constitutional:      Appearance: Normal appearance. He is well-developed.  HENT:     Head: Normocephalic.  Cardiovascular:     Rate and Rhythm: Normal rate.  Pulmonary:     Effort: Pulmonary effort is normal.  Skin:    General: Skin is warm and dry.  Neurological:     Mental Status: He is alert and oriented to person, place, and time.  Psychiatric:        Attention and Perception: Attention and perception normal.        Mood and Affect: Mood and affect normal.        Speech: Speech normal.        Behavior: Behavior normal. Behavior is cooperative.        Thought Content: Thought content normal.        Cognition and Memory: Cognition and memory normal.    Review of Systems  Constitutional: Negative.   HENT: Negative.    Eyes: Negative.   Respiratory: Negative.    Cardiovascular: Negative.   Gastrointestinal: Negative.   Genitourinary: Negative.   Musculoskeletal: Negative.   Skin: Negative.   Neurological: Negative.   Psychiatric/Behavioral: Negative.     Blood pressure (!) 142/94, pulse 72, temperature 97.9 F (36.6 C), temperature source Oral, resp. rate 18, SpO2 100 %. There is no height or weight on file to calculate BMI.  Musculoskeletal: Strength & Muscle Tone: within normal limits Gait & Station: normal Patient leans: N/A   [facility] MSE Discharge Disposition for Follow up and Recommendations: Based on my evaluation the patient does not appear to have an emergency medical condition and can be discharged with resources and follow up care in outpatient services for Medication Management and Individual Therapy  Follow-up with outpatient psychiatry, resources provided.   Craig Lance,  FNP 10/23/2022, 11:57 AM

## 2022-10-26 ENCOUNTER — Telehealth: Payer: Self-pay | Admitting: Physician Assistant

## 2022-10-26 DIAGNOSIS — M542 Cervicalgia: Secondary | ICD-10-CM

## 2022-10-26 DIAGNOSIS — M546 Pain in thoracic spine: Secondary | ICD-10-CM

## 2022-10-26 MED ORDER — CYCLOBENZAPRINE HCL 10 MG PO TABS: 10.0000 mg | ORAL_TABLET | Freq: Three times a day (TID) | ORAL | 0 refills | Status: DC | PRN

## 2022-10-26 MED ORDER — MELOXICAM 15 MG PO TABS: 15.0000 mg | ORAL_TABLET | Freq: Every day | ORAL | 0 refills | Status: DC

## 2022-10-26 NOTE — Patient Instructions (Signed)
Craig Johnson, thank you for joining [name], PA-C for today's virtual visit.  While this provider is not your primary care provider (PCP), if your PCP is located in our provider database this encounter information will be shared with them immediately following your visit.   A [facility] MyChart account gives you access to today's visit and all your visits, tests, and labs performed at [facility] " click here if you don't have a [facility] MyChart account or go to [email or web address]  Consent: (Patient) Craig Johnson provided verbal consent for this virtual visit at the beginning of the encounter.  Current Medications:  Current Outpatient Medications:    cyclobenzaprine (FLEXERIL) 10 MG tablet, Take 1 tablet (10 mg total) by mouth 3 (three) times daily as needed for muscle spasms., Disp: 15 tablet, Rfl: 0   meloxicam (MOBIC) 15 MG tablet, Take 1 tablet (15 mg total) by mouth daily., Disp: 30 tablet, Rfl: 0   atorvastatin (LIPITOR) 20 MG tablet, Take 1 tablet (20 mg total) by mouth daily., Disp: 90 tablet, Rfl: 3   carvedilol (COREG) 12.5 MG tablet, Take 1 tablet (12.5 mg total) by mouth 2 (two) times daily with a meal., Disp: 180 tablet, Rfl: 3   Cholecalciferol 1.25 MG (50000 UT) capsule, Take 1 capsule (50,000 Units total) by mouth every 30 (thirty) days. D3, Disp: 13 capsule, Rfl: 3   Continuous Blood Gluc Receiver (DEXCOM G6 RECEIVER) DEVI, USE READER TO MONITOR BLOOD GLUCOSE CONTINUOUSLY, Disp: 1 each, Rfl: 1   Continuous Blood Gluc Sensor (DEXCOM G6 SENSOR) MISC, CHANGE SENSOR EVERY 10 DAYS AS DIRECTED, Disp: 9 each, Rfl: 9   Continuous Blood Gluc Transmit (DEXCOM G6 TRANSMITTER) MISC, USE 1 TRANSMITTER EVERY 90 DAYS, Disp: 1 each, Rfl: 3   Dapagliflozin Pro-metFORMIN ER 10-998 MG TB24, Take 2 tablets by mouth daily with breakfast., Disp: , Rfl:    ezetimibe (ZETIA) 10 MG tablet, TAKE 1 TABLET(10 MG) BY MOUTH DAILY, Disp: 90 tablet, Rfl: 3    fenofibrate (TRICOR) 145 MG tablet, Take by mouth., Disp: , Rfl:    insulin aspart (NOVOLOG) 100 UNIT/ML injection, USE MAX OF 80 UNITS PER DAY VIA OMNIPOD INSULIN PUMP, Disp: , Rfl:    Insulin Disposable Pump (OMNIPOD DASH 5 PACK PODS) MISC, INJECT UNDER THE SKIN EVERY 3 DAYS AS DIRECTED, Disp: 15 each, Rfl: 2   insulin lispro (HUMALOG) 100 UNIT/ML injection, USE MAX OF 80 UNITS PER DAY VIA OMNIPOD INSULIN PUMP, Disp: 30 mL, Rfl: 3   Olmesartan-amLODIPine-HCTZ 40-10-12.5 MG TABS, Take 1 tablet by mouth every morning., Disp: 90 tablet, Rfl: 3   potassium chloride (KLOR-CON) 10 MEQ tablet, Take 1 tablet (10 mEq total) by mouth daily., Disp: 90 tablet, Rfl: 3   spironolactone (ALDACTONE) 50 MG tablet, TAKE 1 TABLET(50 MG) BY MOUTH DAILY IN THE MORNING, Disp: 90 tablet, Rfl: 3   Medications ordered in this encounter:  Meds ordered this encounter  Medications   meloxicam (MOBIC) 15 MG tablet    Sig: Take 1 tablet (15 mg total) by mouth daily.    Dispense:  30 tablet    Refill:  0    Order Specific Question:   Supervising Provider    Answer:   Merrilee Jansky [[id number]]   cyclobenzaprine (FLEXERIL) 10 MG tablet    Sig: Take 1 tablet (10 mg total) by mouth 3 (three) times daily as needed for muscle spasms.    Dispense:  15 tablet    Refill:  0  Order Specific Question:   Supervising Provider    Answer:   Merrilee Jansky [[id number]]     *If you need refills on other medications prior to your next appointment, please contact your pharmacy*  Follow-Up: Call back or seek an in-person evaluation if the symptoms worsen or if the condition fails to improve as anticipated.  [facility] [phone]  Other Instructions Please avoid heavy lifting or overexertion. Try to change positions every 30 minutes or so while working. Can apply heating pad to the area for 10 to 15 minutes a few times a day. Start the meloxicam once daily as directed. You can take over-the-counter Tylenol  with this. Use the Flexeril in the evening as directed.  You can take up to 3 times daily, but no driving or operating machinery while on this medication. If symptoms or not improving/resolving over the next couple days or you note any new or worsening symptoms, you need an in person evaluation ASAP.  Do not delay care.   If you have been instructed to have an in-person evaluation today at a local Urgent Care facility, please use the link below. It will take you to a list of all of our available [facility], including address, phone number and hours of operation. Please do not delay care.  [facility]  If you or a family member do not have a primary care provider, use the link below to schedule a visit and establish care. When you choose a [facility] primary care physician or advanced practice provider, you gain a long-term partner in health. Find a Primary Care Provider  Learn more about [facility]'s in-office and virtual care options:  - Get Care Now

## 2022-10-26 NOTE — Progress Notes (Signed)
Virtual Visit Consent   Craig Johnson, you are scheduled for a virtual visit with a  provider today. Just as with appointments in the office, your consent must be obtained to participate. Your consent will be active for this visit and any virtual visit you may have with one of our providers in the next 365 days. If you have a MyChart account, a copy of this consent can be sent to you electronically.  As this is a virtual visit, video technology does not allow for your provider to perform a traditional examination. This may limit your provider's ability to fully assess your condition. If your provider identifies any concerns that need to be evaluated in person or the need to arrange testing (such as labs, EKG, etc.), we will make arrangements to do so. Although advances in technology are sophisticated, we cannot ensure that it will always work on either your end or our end. If the connection with a video visit is poor, the visit may have to be switched to a telephone visit. With either a video or telephone visit, we are not always able to ensure that we have a secure connection.  By engaging in this virtual visit, you consent to the provision of healthcare and authorize for your insurance to be billed (if applicable) for the services provided during this visit. Depending on your insurance coverage, you may receive a charge related to this service.  I need to obtain your verbal consent now. Are you willing to proceed with your visit today? [name] has provided verbal consent on 10/26/2022 for a virtual visit (video or telephone). Craig Johnson, [place]  Date: 10/26/2022 9:05 AM  Virtual Visit via Video Note   I, Craig Johnson, connected with  Craig Johnson  ([id number], April 11, 1971) on 10/26/22 at  9:00 AM EDT by a video-enabled telemedicine application and verified that I am speaking with the correct person using two identifiers.  Location: Patient: Virtual Visit  Location Patient: Home Provider: Virtual Visit Location Provider: Home Office   I discussed the limitations of evaluation and management by telemedicine and the availability of in person appointments. The patient expressed understanding and agreed to proceed.    History of Present Illness: Craig Johnson is a 52 y.o. who identifies as a male who was assigned male at birth, and is being seen today for some pain of thoracic and cervical back pain starting last week. Notes pain is worsening with prolonged sitting. Notes tightness and tension in this neck and shoulder. Denies radiation of pain into the arm but some occasional tingling in the shoulder. Denies any trauma or injury.   OTC -- Ibuprofen (Advil)  HPI: HPI  Problems:  Patient Active Problem List   Diagnosis Date Noted   Viral URI with cough 03/05/2022   Gallbladder sludge 01/07/2021   AAA (abdominal aortic aneurysm) (HCC) 01/07/2021   Hyperaldosteronism (HCC) 11/05/2020   Hypertension associated with diabetes (HCC) 12/05/2019   Glaucoma of left eye 08/01/2019   Arthritis 08/01/2019   Fatty liver 08/01/2019   Annual physical exam 02/01/2019   Hypokalemia 10/04/2018   Acute pain of left shoulder 04/01/2018   Erectile dysfunction 08/25/2016   GERD (gastroesophageal reflux disease) 03/17/2015   Type 2 diabetes mellitus with hyperglycemia, with long-term current use of insulin (HCC) 05/02/2014   HTN (hypertension) 05/02/2014   Hyperlipidemia 05/02/2014   Mitral valve regurgitation 05/02/2014    Allergies:  Allergies  Allergen Reactions   Bee Venom Swelling   Shellfish Allergy  Shrimp and lobster swelling but not severe anaphylaxis     Medications:  Current Outpatient Medications:    cyclobenzaprine (FLEXERIL) 10 MG tablet, Take 1 tablet (10 mg total) by mouth 3 (three) times daily as needed for muscle spasms., Disp: 15 tablet, Rfl: 0   meloxicam (MOBIC) 15 MG tablet, Take 1 tablet (15 mg total) by mouth daily., Disp:  30 tablet, Rfl: 0   atorvastatin (LIPITOR) 20 MG tablet, Take 1 tablet (20 mg total) by mouth daily., Disp: 90 tablet, Rfl: 3   carvedilol (COREG) 12.5 MG tablet, Take 1 tablet (12.5 mg total) by mouth 2 (two) times daily with a meal., Disp: 180 tablet, Rfl: 3   Cholecalciferol 1.25 MG (50000 UT) capsule, Take 1 capsule (50,000 Units total) by mouth every 30 (thirty) days. D3, Disp: 13 capsule, Rfl: 3   Continuous Blood Gluc Receiver (DEXCOM G6 RECEIVER) DEVI, USE READER TO MONITOR BLOOD GLUCOSE CONTINUOUSLY, Disp: 1 each, Rfl: 1   Continuous Blood Gluc Sensor (DEXCOM G6 SENSOR) MISC, CHANGE SENSOR EVERY 10 DAYS AS DIRECTED, Disp: 9 each, Rfl: 9   Continuous Blood Gluc Transmit (DEXCOM G6 TRANSMITTER) MISC, USE 1 TRANSMITTER EVERY 90 DAYS, Disp: 1 each, Rfl: 3   Dapagliflozin Pro-metFORMIN ER 10-998 MG TB24, Take 2 tablets by mouth daily with breakfast., Disp: , Rfl:    ezetimibe (ZETIA) 10 MG tablet, TAKE 1 TABLET(10 MG) BY MOUTH DAILY, Disp: 90 tablet, Rfl: 3   fenofibrate (TRICOR) 145 MG tablet, Take by mouth., Disp: , Rfl:    insulin aspart (NOVOLOG) 100 UNIT/ML injection, USE MAX OF 80 UNITS PER DAY VIA OMNIPOD INSULIN PUMP, Disp: , Rfl:    Insulin Disposable Pump (OMNIPOD DASH 5 PACK PODS) MISC, INJECT UNDER THE SKIN EVERY 3 DAYS AS DIRECTED, Disp: 15 each, Rfl: 2   insulin lispro (HUMALOG) 100 UNIT/ML injection, USE MAX OF 80 UNITS PER DAY VIA OMNIPOD INSULIN PUMP, Disp: 30 mL, Rfl: 3   Olmesartan-amLODIPine-HCTZ 40-10-12.5 MG TABS, Take 1 tablet by mouth every morning., Disp: 90 tablet, Rfl: 3   potassium chloride (KLOR-CON) 10 MEQ tablet, Take 1 tablet (10 mEq total) by mouth daily., Disp: 90 tablet, Rfl: 3   spironolactone (ALDACTONE) 50 MG tablet, TAKE 1 TABLET(50 MG) BY MOUTH DAILY IN THE MORNING, Disp: 90 tablet, Rfl: 3  Observations/Objective: Patient is well-developed, well-nourished in no acute distress.  Resting comfortably at home.  Head is normocephalic, atraumatic.  No  labored breathing. Speech is clear and coherent with logical content.  Patient is alert and oriented at baseline.  Normal ROM of L shoulder demonstrated.  Assessment and Plan: 1. Acute bilateral thoracic back pain - meloxicam (MOBIC) 15 MG tablet; Take 1 tablet (15 mg total) by mouth daily.  Dispense: 30 tablet; Refill: 0 - cyclobenzaprine (FLEXERIL) 10 MG tablet; Take 1 tablet (10 mg total) by mouth 3 (three) times daily as needed for muscle spasms.  Dispense: 15 tablet; Refill: 0  2. Cervicalgia - meloxicam (MOBIC) 15 MG tablet; Take 1 tablet (15 mg total) by mouth daily.  Dispense: 30 tablet; Refill: 0 - cyclobenzaprine (FLEXERIL) 10 MG tablet; Take 1 tablet (10 mg total) by mouth 3 (three) times daily as needed for muscle spasms.  Dispense: 15 tablet; Refill: 0  Meloxicam per orders. Tylenol OTC for breakthrough pain.  Flexeril to use at nighttime.  Can be used up to 3 times daily but he is aware no driving or operating heavy machinery while taking this medication.  Strict in person follow-up precautions  reviewed.  Work note provided.  Follow Up Instructions: I discussed the assessment and treatment plan with the patient. The patient was provided an opportunity to ask questions and all were answered. The patient agreed with the plan and demonstrated an understanding of the instructions.  A copy of instructions were sent to the patient via MyChart unless otherwise noted below.   The patient was advised to call back or seek an in-person evaluation if the symptoms worsen or if the condition fails to improve as anticipated.  Time:  I spent 10 minutes with the patient via telehealth technology discussing the above problems/concerns.    Craig Climes, PA-C

## 2022-10-28 ENCOUNTER — Telehealth: Payer: Self-pay | Admitting: Family Medicine

## 2022-10-28 DIAGNOSIS — M546 Pain in thoracic spine: Secondary | ICD-10-CM

## 2022-10-28 DIAGNOSIS — M542 Cervicalgia: Secondary | ICD-10-CM

## 2022-10-28 NOTE — Progress Notes (Signed)
NO Charge  Needs to be seen in person for on going pain in neck and back.  Patient acknowledged agreement and understanding of the plan.

## 2022-10-29 ENCOUNTER — Ambulatory Visit
Admission: RE | Admit: 2022-10-29 | Discharge: 2022-10-29 | Disposition: A | Discharge status: Home / Self Care | Payer: Commercial Managed Care - PPO | Source: Ambulatory Visit | Attending: Physician Assistant | Admitting: Physician Assistant

## 2022-10-29 ENCOUNTER — Other Ambulatory Visit: Payer: Self-pay | Admitting: Physician Assistant

## 2022-10-29 DIAGNOSIS — M545 Low back pain, unspecified: Secondary | ICD-10-CM

## 2022-10-29 DIAGNOSIS — M542 Cervicalgia: Secondary | ICD-10-CM

## 2022-11-02 ENCOUNTER — Encounter: Payer: Self-pay | Admitting: Family Medicine

## 2022-11-03 DIAGNOSIS — Q6650 Congenital pes planus, unspecified foot: Secondary | ICD-10-CM | POA: Insufficient documentation

## 2022-11-04 ENCOUNTER — Encounter: Payer: Self-pay | Admitting: Family Medicine

## 2022-11-04 ENCOUNTER — Ambulatory Visit (INDEPENDENT_AMBULATORY_CARE_PROVIDER_SITE_OTHER): Payer: No Typology Code available for payment source | Admitting: Family Medicine

## 2022-11-04 VITALS — BP 122/82 | HR 81 | Ht 72.0 in | Wt 179.0 lb

## 2022-11-04 DIAGNOSIS — I700 Atherosclerosis of aorta: Secondary | ICD-10-CM | POA: Diagnosis not present

## 2022-11-04 DIAGNOSIS — Z794 Long term (current) use of insulin: Secondary | ICD-10-CM

## 2022-11-04 DIAGNOSIS — I10 Essential (primary) hypertension: Secondary | ICD-10-CM

## 2022-11-04 DIAGNOSIS — E1169 Type 2 diabetes mellitus with other specified complication: Secondary | ICD-10-CM

## 2022-11-04 DIAGNOSIS — M5136 Other intervertebral disc degeneration, lumbar region: Secondary | ICD-10-CM

## 2022-11-04 DIAGNOSIS — I152 Hypertension secondary to endocrine disorders: Secondary | ICD-10-CM

## 2022-11-04 DIAGNOSIS — I714 Abdominal aortic aneurysm, without rupture, unspecified: Secondary | ICD-10-CM | POA: Diagnosis not present

## 2022-11-04 DIAGNOSIS — M5030 Other cervical disc degeneration, unspecified cervical region: Secondary | ICD-10-CM

## 2022-11-04 DIAGNOSIS — E559 Vitamin D deficiency, unspecified: Secondary | ICD-10-CM

## 2022-11-04 DIAGNOSIS — M545 Low back pain, unspecified: Secondary | ICD-10-CM

## 2022-11-04 DIAGNOSIS — Z114 Encounter for screening for human immunodeficiency virus [HIV]: Secondary | ICD-10-CM

## 2022-11-04 DIAGNOSIS — E1159 Type 2 diabetes mellitus with other circulatory complications: Secondary | ICD-10-CM | POA: Diagnosis not present

## 2022-11-04 DIAGNOSIS — E876 Hypokalemia: Secondary | ICD-10-CM

## 2022-11-04 DIAGNOSIS — K760 Fatty (change of) liver, not elsewhere classified: Secondary | ICD-10-CM

## 2022-11-04 DIAGNOSIS — M546 Pain in thoracic spine: Secondary | ICD-10-CM

## 2022-11-04 DIAGNOSIS — M51369 Other intervertebral disc degeneration, lumbar region without mention of lumbar back pain or lower extremity pain: Secondary | ICD-10-CM

## 2022-11-04 DIAGNOSIS — M542 Cervicalgia: Secondary | ICD-10-CM

## 2022-11-04 DIAGNOSIS — E119 Type 2 diabetes mellitus without complications: Secondary | ICD-10-CM

## 2022-11-04 DIAGNOSIS — G8929 Other chronic pain: Secondary | ICD-10-CM

## 2022-11-04 DIAGNOSIS — E785 Hyperlipidemia, unspecified: Secondary | ICD-10-CM

## 2022-11-04 DIAGNOSIS — E269 Hyperaldosteronism, unspecified: Secondary | ICD-10-CM | POA: Diagnosis not present

## 2022-11-04 DIAGNOSIS — E1165 Type 2 diabetes mellitus with hyperglycemia: Secondary | ICD-10-CM

## 2022-11-04 MED ORDER — TIZANIDINE HCL 4 MG PO TABS
4.0000 mg | ORAL_TABLET | Freq: Three times a day (TID) | ORAL | 0 refills | Status: DC | PRN
Start: 2022-11-04 — End: 2023-05-10

## 2022-11-04 MED ORDER — CARVEDILOL 25 MG PO TABS: 25.0000 mg | ORAL_TABLET | Freq: Every day | ORAL | Status: DC

## 2022-11-04 MED ORDER — TIZANIDINE HCL 4 MG PO TABS
4.0000 mg | ORAL_TABLET | Freq: Four times a day (QID) | ORAL | 0 refills | Status: DC | PRN
Start: 2022-11-04 — End: 2022-11-04

## 2022-11-04 NOTE — Progress Notes (Signed)
   SUBJECTIVE:   Chief Complaint  Patient presents with  . Establish Care  . Back Pain    Seen by UCx2, xray normal, meds not helping   HPI Patient presents to clinic to establish care  No acute concerns today.  Back pain Initially seen 04/29 via virtual visit for acute bilateral thoracic back pain and cervicalgia  x 1 week.  Symptoms included tightness and tension in neck and shoulder.  Worsened with prolonged sitting.  He was prescribed Flexeril 10 mg 3 times daily and meloxicam 15 mg daily.  Reports not taking meloxicam and indicates Flexeril has not helped but makes him drowsy.  He remained off work secondary to back pain.   10/28/22 video visit for continued symptoms and was advised to follow-up for in person evaluation.   Reports was recently seen at urgent care for neck and back pain.  Was prescribed meloxicam, Flexeril and reports medications not helping.  Had discontinued meloxicam.  Indicates Flexeril side effects increased drowsiness.  He works from home  Off work from 04/26-29   PERTINENT PMH / PSH: ***  OBJECTIVE:  BP 122/82   Pulse 81   Ht 6' (1.829 m)   Wt 179 lb (81.2 kg)   SpO2 95%   BMI 24.28 kg/m    Physical Exam Constitutional:      General: He is not in acute distress.    Appearance: He is normal weight. He is not ill-appearing.  HENT:     Head: Normocephalic.  Eyes:     Conjunctiva/sclera: Conjunctivae normal.  Neck:     Thyroid: No thyromegaly or thyroid tenderness.  Cardiovascular:     Rate and Rhythm: Normal rate and regular rhythm.     Pulses: Normal pulses.  Pulmonary:     Effort: Pulmonary effort is normal.     Breath sounds: Normal breath sounds.  Abdominal:     General: Bowel sounds are normal.  Musculoskeletal:     Cervical back: Normal.     Thoracic back: Normal.     Lumbar back: Normal.  Neurological:     Mental Status: He is alert. Mental status is at baseline.  Psychiatric:        Mood and Affect: Mood normal.         Behavior: Behavior normal.        Thought Content: Thought content normal.        Judgment: Judgment normal.    ASSESSMENT/PLAN:  Chronic low back pain without sciatica, unspecified back pain laterality -     Ambulatory referral to Physical Therapy -     tiZANidine HCl; Take 1 tablet (4 mg total) by mouth every 8 (eight) hours as needed for muscle spasms.  Dispense: 30 tablet; Refill: 0  Essential hypertension -     Carvedilol; Take 1 tablet (25 mg total) by mouth daily.   PDMP reviewed***  No follow-ups on file.  Dana Allan, MD

## 2022-11-04 NOTE — Patient Instructions (Addendum)
It was a pleasure meeting you today. Thank you for allowing me to take part in your health care.  Our goals for today as we discussed include:  Stop potassium today. Follow-up with endocrinology to have repeat potassium rechecked.  Stop Flexeril Start Zanaflex 4 mg 3 times daily as needed Tylenol 500-650 mg every 6 hours as needed. Use heat/ice as needed Gentle low back exercises and stretches. Continue to stay mobile Physical therapy referral sent  Eye exam due   If you have any questions or concerns, please do not hesitate to call the office at [phone].  I look forward to our next visit and until then take care and stay safe.  Regards,   Dana Allan, MD   [facility]

## 2022-11-05 ENCOUNTER — Encounter: Payer: Self-pay | Admitting: Family Medicine

## 2022-11-05 NOTE — Telephone Encounter (Signed)
FYI. Thanks.

## 2022-11-08 ENCOUNTER — Encounter: Payer: Self-pay | Admitting: Family Medicine

## 2022-11-08 DIAGNOSIS — M546 Pain in thoracic spine: Secondary | ICD-10-CM

## 2022-11-08 DIAGNOSIS — G8929 Other chronic pain: Secondary | ICD-10-CM

## 2022-11-10 NOTE — Telephone Encounter (Signed)
Do you have this paperwork? Thanks!

## 2022-11-12 NOTE — Telephone Encounter (Signed)
Ok for referral to orthopedics?   Thanks!

## 2022-11-17 LAB — HM DIABETES EYE EXAM

## 2022-11-18 NOTE — Telephone Encounter (Addendum)
Pt called again about his FMLA paperwork and want to know why the provider is not responding. Pt asked was there anyone handling her messages and I let him know that someone is checking her in basket. Pt stated he is on a deadline for the papers to get turned in. I told the pt to let his job know that the provider is out of the office until Tuesday. He stated he will and he will call back

## 2022-11-18 NOTE — Telephone Encounter (Signed)
Provider was contacted regarding FMLA paperwork . She stated to put worsening pain with prolonged sitting in lower back and neck on line #14. The patient came in to pick up the updated FMLA paperwork and I faxed over the paperwork to [facility]. Updated paperwork scanned to media.

## 2022-11-18 NOTE — Telephone Encounter (Signed)
Pt called about mychart message 

## 2022-11-20 ENCOUNTER — Telehealth: Payer: No Typology Code available for payment source | Admitting: Nurse Practitioner

## 2022-11-20 DIAGNOSIS — G8929 Other chronic pain: Secondary | ICD-10-CM

## 2022-11-20 DIAGNOSIS — M545 Low back pain, unspecified: Secondary | ICD-10-CM

## 2022-11-20 NOTE — Progress Notes (Signed)
Virtual Visit Consent   Rope Brietzke, you are scheduled for a virtual visit with a [facility] provider today. Just as with appointments in the office, your consent must be obtained to participate. Your consent will be active for this visit and any virtual visit you may have with one of our providers in the next 365 days. If you have a MyChart account, a copy of this consent can be sent to you electronically.  As this is a virtual visit, video technology does not allow for your provider to perform a traditional examination. This may limit your provider's ability to fully assess your condition. If your provider identifies any concerns that need to be evaluated in person or the need to arrange testing (such as labs, EKG, etc.), we will make arrangements to do so. Although advances in technology are sophisticated, we cannot ensure that it will always work on either your end or our end. If the connection with a video visit is poor, the visit may have to be switched to a telephone visit. With either a video or telephone visit, we are not always able to ensure that we have a secure connection.  By engaging in this virtual visit, you consent to the provision of healthcare and authorize for your insurance to be billed (if applicable) for the services provided during this visit. Depending on your insurance coverage, you may receive a charge related to this service.  I need to obtain your verbal consent now. Are you willing to proceed with your visit today? Burris Albers has provided verbal consent on 11/20/2022 for a virtual visit (video or telephone). Viviano Simas, FNP  Date: 11/20/2022 8:57 AM  Virtual Visit via Video Note   I, Viviano Simas, connected with  Shawnee Petzold  ([id number], [birth year]) on 11/20/22 at  9:00 AM EDT by a video-enabled telemedicine application and verified that I am speaking with the correct person using two identifiers.  Location: Patient: Virtual Visit Location Patient:  Home Provider: Virtual Visit Location Provider: Home Office   I discussed the limitations of evaluation and management by telemedicine and the availability of in person appointments. The patient expressed understanding and agreed to proceed.    History of Present Illness: Tavoris Hucks is a 52 y.o. who identifies as a male who was assigned male at birth, and is being seen today for ongoing neck and back pain.  He has had ongoing pain for the past month   He has been seen by PCP/UC and VUC in the past month   He returned to work yesterday and by the end of the day he was in increased pain He went to [facility] after work yesterday  Was given a referral to a Writer and a pain management doctor  He has an appointment with Pain doctor next Tuesday in [place]   He has been using Zanaflex - used last night and was able to sleep   Vitis today is mainly to excuse work today   Problems:  Patient Active Problem List   Diagnosis Date Noted   Pes planus, congenital 11/03/2022   Viral URI with cough 03/05/2022   Gallbladder sludge 01/07/2021   AAA (abdominal aortic aneurysm) (HCC) 01/07/2021   Hyperaldosteronism (HCC) 11/05/2020   Hypertension associated with diabetes (HCC) 12/05/2019   Glaucoma of left eye 08/01/2019   Arthritis 08/01/2019   Fatty liver 08/01/2019   Annual physical exam 02/01/2019   Hypokalemia 10/04/2018   Rotator cuff tendinitis, left 04/15/2018   Acute pain  of left shoulder 04/01/2018   Erectile dysfunction 08/25/2016   GERD (gastroesophageal reflux disease) 03/17/2015   Type 2 diabetes mellitus with hyperglycemia, with long-term current use of insulin (HCC) 05/02/2014   HTN (hypertension) 05/02/2014   Hyperlipidemia 05/02/2014   Mitral valve regurgitation 05/02/2014    Allergies:  Allergies  Allergen Reactions   Bee Venom Swelling   Shellfish Allergy     Shrimp and lobster swelling but not severe anaphylaxis     Medications:  Current  Outpatient Medications:    atorvastatin (LIPITOR) 20 MG tablet, Take 1 tablet (20 mg total) by mouth daily., Disp: 90 tablet, Rfl: 3   carvedilol (COREG) 25 MG tablet, Take 1 tablet (25 mg total) by mouth daily., Disp: , Rfl:    Cholecalciferol 1.25 MG (50000 UT) capsule, Take 1 capsule (50,000 Units total) by mouth every 30 (thirty) days. D3, Disp: 13 capsule, Rfl: 3   Continuous Blood Gluc Receiver (DEXCOM G6 RECEIVER) DEVI, USE READER TO MONITOR BLOOD GLUCOSE CONTINUOUSLY, Disp: 1 each, Rfl: 1   Continuous Blood Gluc Sensor (DEXCOM G6 SENSOR) MISC, CHANGE SENSOR EVERY 10 DAYS AS DIRECTED, Disp: 9 each, Rfl: 9   Continuous Blood Gluc Transmit (DEXCOM G6 TRANSMITTER) MISC, USE 1 TRANSMITTER EVERY 90 DAYS, Disp: 1 each, Rfl: 3   Dapagliflozin Pro-metFORMIN ER 10-998 MG TB24, Take 2 tablets by mouth daily with breakfast., Disp: , Rfl:    ezetimibe (ZETIA) 10 MG tablet, TAKE 1 TABLET(10 MG) BY MOUTH DAILY, Disp: 90 tablet, Rfl: 3   insulin aspart (NOVOLOG) 100 UNIT/ML injection, USE MAX OF 80 UNITS PER DAY VIA OMNIPOD INSULIN PUMP, Disp: , Rfl:    Insulin Disposable Pump (OMNIPOD DASH 5 PACK PODS) MISC, INJECT UNDER THE SKIN EVERY 3 DAYS AS DIRECTED, Disp: 15 each, Rfl: 2   insulin lispro (HUMALOG) 100 UNIT/ML injection, USE MAX OF 80 UNITS PER DAY VIA OMNIPOD INSULIN PUMP, Disp: 30 mL, Rfl: 3   Olmesartan-amLODIPine-HCTZ 40-10-12.5 MG TABS, Take 1 tablet by mouth every morning., Disp: 90 tablet, Rfl: 3   potassium chloride (KLOR-CON) 10 MEQ tablet, Take 1 tablet (10 mEq total) by mouth daily., Disp: 90 tablet, Rfl: 3   spironolactone (ALDACTONE) 50 MG tablet, TAKE 1 TABLET(50 MG) BY MOUTH DAILY IN THE MORNING, Disp: 90 tablet, Rfl: 3   tiZANidine (ZANAFLEX) 4 MG tablet, Take 1 tablet (4 mg total) by mouth every 8 (eight) hours as needed for muscle spasms., Disp: 30 tablet, Rfl: 0  Observations/Objective: Patient is well-developed, well-nourished in no acute distress.  Resting comfortably  at home.   Head is normocephalic, atraumatic.  No labored breathing.  Speech is clear and coherent with logical content.  Patient is alert and oriented at baseline.    Assessment and Plan: 1. Chronic midline low back pain, unspecified whether sciatica present Work note provided for today   Advised on upcoming appointment  Continue management with prescribed Zanaflex until that time  Patient is agreeable to plan      Follow Up Instructions: I discussed the assessment and treatment plan with the patient. The patient was provided an opportunity to ask questions and all were answered. The patient agreed with the plan and demonstrated an understanding of the instructions.  A copy of instructions were sent to the patient via MyChart unless otherwise noted below.    The patient was advised to call back or seek an in-person evaluation if the symptoms worsen or if the condition fails to improve as anticipated.  Time:  I spent  15 minutes with the patient via telehealth technology discussing the above problems/concerns.    Viviano Simas, FNP

## 2022-11-21 NOTE — Progress Notes (Incomplete)
SUBJECTIVE:   Chief Complaint  Patient presents with  . Establish Care  . Back Pain    Seen by UCx2, xray normal, meds not helping   HPI Patient presents to clinic to establish care  No acute concerns today.  Back pain Initially seen 04/29 via video visit for acute bilateral thoracic back pain and cervicalgia  x 1 week.  Symptoms included tightness and tension in neck and shoulder.  Worsened with prolonged sitting.  He was prescribed Flexeril 10 mg 3 times daily and meloxicam 15 mg daily.  Reports not taking meloxicam and indicates Flexeril has not helped but makes him drowsy.  He also reported was prescribed methocarbamol but has not been taking medication as did not help.  He remained off work secondary to back pain.   10/28/22 video visit for continued symptoms and was advised to follow-up for in person evaluation. 10/29/22.  Seen at [facility].  Cervical lumbar spine xrays obtained and significant for mild DDD with disc space narrowing and marginal osteophytes at C3-4 through C6-7, DDD with disc space narrowing and marginal osteophytes at L3-4.   Patient is requesting refill of Flexeril, but given increased drowsiness okay to trial baclofen or tizanidine.  Has not been referred to physical therapy. 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  No heavy lifting.  He is requesting FMLA forms to be completed for April 29 through May 8.  Mood disorder Patient has history of MyChart on 04/24 indicating increased stress and at a breaking point.  He was provided with [facility] 24-hour mental services in [place].  Was evaluated on 04/26 and return to work on 04/29.  Recommended follow-up with outpatient psychiatry for continued management.  Type 2 diabetes Asymptomatic.  Recent A1c 6.9.  Has OmniPod insulin pump, basal rate total 48.4 units.  He is also currently taking Xigduo 10-998 mg 2 tablets daily and tolerating well.  On ARB and statin  therapy.  Hypertension Asymptomatic.  Currently on carvedilol 25 mg daily, Aldactone 50 mg daily, olmesartan-amlodipine-hydrochlorothiazide 40-10-12.5 mg daily.  Tolerating medication well.  Endorses blood pressures at home 120-125/80-85.  History of hypokalemia. High risk for hypokalemia given continued insulin use, diuretic use.  Patient is currently on potassium chloride 10 mEq daily.  Recent potassium 4.1.  Upon review of endocrinology's note was recommended to discontinue potassium, increase Aldactone to 100 mg daily and repeat potassium during today's visit.  Unfortunately patient has not followed recommendations so will defer to endocrinology.  Hyperlipidemia Currently on atorvastatin 20 mg daily and Zetia 10 mg daily.  Tolerating medication well.  Most recent LDL at goal.  He is currently having issues with cervicalgia and mid thoracic pain.  Reviewed recent notes from endocrinology it appears patients atorvastatin was increased to 40 mg daily.  Was also noted taking fenofibrate 145 mg daily.  PERTINENT PMH / PSH: ***  OBJECTIVE:  BP 122/82   Pulse 81   Ht 6' (1.829 m)   Wt 179 lb (81.2 kg)   SpO2 95%   BMI 24.28 kg/m    Physical Exam Constitutional:      General: He is not in acute distress.    Appearance: He is normal weight. He is not ill-appearing.  HENT:     Head: Normocephalic.  Eyes:     Conjunctiva/sclera: Conjunctivae normal.  Neck:     Thyroid: No thyromegaly or thyroid tenderness.  Cardiovascular:     Rate and Rhythm: Normal rate and regular rhythm.  Pulses: Normal pulses.  Pulmonary:     Effort: Pulmonary effort is normal.     Breath sounds: Normal breath sounds.  Abdominal:     General: Bowel sounds are normal.  Musculoskeletal:     Cervical back: Normal.     Thoracic back: Normal.     Lumbar back: Normal.  Neurological:     Mental Status: He is alert. Mental status is at baseline.  Psychiatric:        Mood and Affect: Mood normal.         Behavior: Behavior normal.        Thought Content: Thought content normal.        Judgment: Judgment normal.    ASSESSMENT/PLAN:  Chronic low back pain without sciatica, unspecified back pain laterality -     Ambulatory referral to Physical Therapy -     tiZANidine HCl; Take 1 tablet (4 mg total) by mouth every 8 (eight) hours as needed for muscle spasms.  Dispense: 30 tablet; Refill: 0  Essential hypertension -     Carvedilol; Take 1 tablet (25 mg total) by mouth daily.   PDMP reviewed***  No follow-ups on file.  Dana Allan, MD

## 2022-11-22 ENCOUNTER — Encounter: Payer: Self-pay | Admitting: Family Medicine

## 2022-11-22 ENCOUNTER — Telehealth: Payer: Self-pay | Admitting: Family Medicine

## 2022-11-22 DIAGNOSIS — M51369 Other intervertebral disc degeneration, lumbar region without mention of lumbar back pain or lower extremity pain: Secondary | ICD-10-CM | POA: Insufficient documentation

## 2022-11-22 DIAGNOSIS — I700 Atherosclerosis of aorta: Secondary | ICD-10-CM | POA: Insufficient documentation

## 2022-11-22 DIAGNOSIS — M5030 Other cervical disc degeneration, unspecified cervical region: Secondary | ICD-10-CM | POA: Insufficient documentation

## 2022-11-22 DIAGNOSIS — M546 Pain in thoracic spine: Secondary | ICD-10-CM | POA: Insufficient documentation

## 2022-11-22 DIAGNOSIS — G8929 Other chronic pain: Secondary | ICD-10-CM | POA: Insufficient documentation

## 2022-11-22 DIAGNOSIS — E119 Type 2 diabetes mellitus without complications: Secondary | ICD-10-CM | POA: Insufficient documentation

## 2022-11-22 DIAGNOSIS — Z114 Encounter for screening for human immunodeficiency virus [HIV]: Secondary | ICD-10-CM | POA: Insufficient documentation

## 2022-11-22 DIAGNOSIS — M542 Cervicalgia: Secondary | ICD-10-CM | POA: Insufficient documentation

## 2022-11-22 DIAGNOSIS — M5136 Other intervertebral disc degeneration, lumbar region: Secondary | ICD-10-CM | POA: Insufficient documentation

## 2022-11-22 DIAGNOSIS — E559 Vitamin D deficiency, unspecified: Secondary | ICD-10-CM | POA: Insufficient documentation

## 2022-11-22 NOTE — Assessment & Plan Note (Signed)
Chronic.  Blood pressure well-controlled. CT abdomen did not show any adrenal mass. Follows with endocrinology Clarify dose of Aldactone.

## 2022-11-22 NOTE — Assessment & Plan Note (Signed)
No improvement in symptoms. Recent cervical x-rays notable for mild DDD. Discontinue Flexeril Trial tizanidine 4 mg every 6 hours as needed.  Recommend using sparingly at night to allow for activity during the day. Referral sent for physical therapy. Heat/ice as needed Gentle neck exercises Given statin therapy will check CK

## 2022-11-22 NOTE — Assessment & Plan Note (Signed)
Chronic.  Mixed hyperlipidemia, hypertriglyceridemia.  On statin therapy and tolerating well.  Continue atorvastatin.  Need to clarify dose 20 or 40 mg daily Continue Zetia 10 mg daily Need to clarify if patient taking fenofibrate 145 mg daily

## 2022-11-22 NOTE — Assessment & Plan Note (Signed)
Chronic.  Noted on CT abdomen 04/2017. Currently on statin therapy and tolerating well. Continue atorvastatin 20 mg daily.  Need to clarify dosage given recent endocrinology note indicates patient on 40 mg. Continue Zetia 10 mg daily

## 2022-11-22 NOTE — Assessment & Plan Note (Signed)
Chronic.  On supplementation. Continue vitamin D 1.25 mg monthly Check vitamin D levels

## 2022-11-22 NOTE — Assessment & Plan Note (Addendum)
Chronic.  Asymptomatic.  Previous workup for secondary etiologies completed Continue carvedilol 25 mg daily Continue olmesartan-amlodipine-hydrochlorothiazide 40-10/12.5 mg daily Continue Aldactone 50 mg daily.  Need to clarify dose with patient at next visit. Maintain blood pressure control less than 130/80 Patient continues to take potassium chloride 10 mEq daily.  Request patient to clarify that he has stopped potassium as recommended by endocrinology. Will repeat potassium if discontinued. Follow up in 6 months

## 2022-11-22 NOTE — Assessment & Plan Note (Signed)
Tightness and tension in shoulder and neck area.  Worsening when sitting.  Continues to have pain. FMLA forms completed from 04/29-05/8.   Requesting continued time off work for worsening pain. Referral has been sent for physical therapy.  Recommend he call to schedule appointment Will refer to orthopedics for further evaluation.

## 2022-11-22 NOTE — Assessment & Plan Note (Signed)
Chronic.  Reports previous history of tobacco use.  Denies vaping, THC use. Abdominal ultrasound 07/22 showed proximal abdominal aorta measuring up to 3 cm in diameter.  Recommendations for repeat ultrasound every 3 years. Currently asymptomatic. Blood pressure well-controlled on current medications. Maintain blood pressure goal less than 130/80.

## 2022-11-22 NOTE — Telephone Encounter (Signed)
err

## 2022-11-22 NOTE — Assessment & Plan Note (Signed)
Chronic.  Asymptomatic.  Mild hepatic noted on CT abdomen 04/2017.  Recent abdominal ultrasound 12/2020 notable for diffusely increased hepatic parenchymal echogenicity.  Remains asymptomatic. History of elevated LFTs Repeat c-Met Fib 4 score 1.63. Consider right upper quadrant ultrasound with elastography.  Plan for further discussion with patient.

## 2022-11-22 NOTE — Assessment & Plan Note (Signed)
Chronic.  Recent A1c 6.9. Has Dexcom sensor OmniPod insulin pump Tolerating Xigduo 10-998 mg 2 tablets twice daily On ARB and statin therapy Blood pressure well-controlled. Referral sent for diabetic eye exam Annual foot exam due 07/24 Urine ACR due 07/24 Follows with endocrinology at [facility] clinic.

## 2022-11-22 NOTE — Assessment & Plan Note (Signed)
Chronic.  Recent potassium 4.1.  Patient indicated had not stopped potassium supplementation.  Unknown if aldosterone was increased from 50 to 100 mg as recommended by endocrinologist. Patient will check medications at home to confirm discontinued use. Will check K once clarifed

## 2022-12-23 ENCOUNTER — Ambulatory Visit: Payer: No Typology Code available for payment source | Admitting: Physical Therapy

## 2022-12-28 ENCOUNTER — Encounter: Payer: No Typology Code available for payment source | Admitting: Physical Therapy

## 2022-12-28 ENCOUNTER — Ambulatory Visit: Payer: No Typology Code available for payment source | Admitting: Physical Therapy

## 2022-12-30 ENCOUNTER — Encounter: Payer: No Typology Code available for payment source | Admitting: Physical Therapy

## 2023-01-05 ENCOUNTER — Encounter: Payer: No Typology Code available for payment source | Admitting: Physical Therapy

## 2023-01-07 ENCOUNTER — Encounter: Payer: No Typology Code available for payment source | Admitting: Physical Therapy

## 2023-01-11 ENCOUNTER — Encounter: Payer: No Typology Code available for payment source | Admitting: Physical Therapy

## 2023-01-13 ENCOUNTER — Encounter: Payer: No Typology Code available for payment source | Admitting: Physical Therapy

## 2023-01-18 ENCOUNTER — Encounter: Payer: No Typology Code available for payment source | Admitting: Physical Therapy

## 2023-01-20 ENCOUNTER — Encounter: Payer: No Typology Code available for payment source | Admitting: Physical Therapy

## 2023-01-26 ENCOUNTER — Encounter: Payer: No Typology Code available for payment source | Admitting: Physical Therapy

## 2023-05-10 ENCOUNTER — Encounter: Payer: Self-pay | Admitting: Family Medicine

## 2023-05-10 ENCOUNTER — Ambulatory Visit (INDEPENDENT_AMBULATORY_CARE_PROVIDER_SITE_OTHER): Payer: BC Managed Care – PPO | Admitting: Family Medicine

## 2023-05-10 VITALS — BP 110/70 | HR 82 | Temp 98.0°F | Resp 16 | Ht 72.0 in | Wt 185.0 lb

## 2023-05-10 DIAGNOSIS — Z114 Encounter for screening for human immunodeficiency virus [HIV]: Secondary | ICD-10-CM | POA: Diagnosis not present

## 2023-05-10 DIAGNOSIS — I700 Atherosclerosis of aorta: Secondary | ICD-10-CM

## 2023-05-10 DIAGNOSIS — Z23 Encounter for immunization: Secondary | ICD-10-CM | POA: Diagnosis not present

## 2023-05-10 DIAGNOSIS — R5383 Other fatigue: Secondary | ICD-10-CM | POA: Diagnosis not present

## 2023-05-10 DIAGNOSIS — E118 Type 2 diabetes mellitus with unspecified complications: Secondary | ICD-10-CM | POA: Diagnosis not present

## 2023-05-10 DIAGNOSIS — E1159 Type 2 diabetes mellitus with other circulatory complications: Secondary | ICD-10-CM | POA: Diagnosis not present

## 2023-05-10 DIAGNOSIS — E876 Hypokalemia: Secondary | ICD-10-CM | POA: Diagnosis not present

## 2023-05-10 DIAGNOSIS — E1169 Type 2 diabetes mellitus with other specified complication: Secondary | ICD-10-CM

## 2023-05-10 DIAGNOSIS — I152 Hypertension secondary to endocrine disorders: Secondary | ICD-10-CM

## 2023-05-10 DIAGNOSIS — K760 Fatty (change of) liver, not elsewhere classified: Secondary | ICD-10-CM

## 2023-05-10 DIAGNOSIS — M542 Cervicalgia: Secondary | ICD-10-CM

## 2023-05-10 DIAGNOSIS — E559 Vitamin D deficiency, unspecified: Secondary | ICD-10-CM

## 2023-05-10 DIAGNOSIS — M546 Pain in thoracic spine: Secondary | ICD-10-CM

## 2023-05-10 DIAGNOSIS — E785 Hyperlipidemia, unspecified: Secondary | ICD-10-CM | POA: Diagnosis not present

## 2023-05-10 LAB — CBC
HCT: 40.5 % (ref 39.0–52.0)
Hemoglobin: 13.5 g/dL (ref 13.0–17.0)
MCHC: 33.3 g/dL (ref 30.0–36.0)
MCV: 93.9 fL (ref 78.0–100.0)
Platelets: 207 10*3/uL (ref 150.0–400.0)
RBC: 4.31 Mil/uL (ref 4.22–5.81)
RDW: 13.1 % (ref 11.5–15.5)
WBC: 3.7 10*3/uL — ABNORMAL LOW (ref 4.0–10.5)

## 2023-05-10 LAB — LIPID PANEL
Cholesterol: 108 mg/dL (ref 0–200)
HDL: 41.6 mg/dL (ref >39.00)
LDL Cholesterol: 40 mg/dL (ref 0–99)
NonHDL: 66.24
Total CHOL/HDL Ratio: 3
Triglycerides: 131 mg/dL (ref 0.0–149.0)
VLDL: 26.2 mg/dL (ref 0.0–40.0)

## 2023-05-10 LAB — COMPREHENSIVE METABOLIC PANEL
ALT: 34 U/L (ref 0–53)
AST: 26 U/L (ref 0–37)
Albumin: 4.7 g/dL (ref 3.5–5.2)
Alkaline Phosphatase: 39 U/L (ref 39–117)
BUN: 11 mg/dL (ref 6–23)
CO2: 27 meq/L (ref 19–32)
Calcium: 9.3 mg/dL (ref 8.4–10.5)
Chloride: 104 meq/L (ref 96–112)
Creatinine, Ser: 0.9 mg/dL (ref 0.40–1.50)
GFR: 97.46 mL/min (ref >60.00)
Glucose, Bld: 124 mg/dL — ABNORMAL HIGH (ref 70–99)
Potassium: 4.6 meq/L (ref 3.5–5.1)
Sodium: 140 meq/L (ref 135–145)
Total Bilirubin: 0.3 mg/dL (ref 0.2–1.2)
Total Protein: 7.5 g/dL (ref 6.0–8.3)

## 2023-05-10 LAB — MICROALBUMIN / CREATININE URINE RATIO
Creatinine,U: 95.3 mg/dL
Microalb Creat Ratio: 0.7 mg/g (ref 0.0–30.0)
Microalb, Ur: <0.7 mg/dL (ref 0.0–1.9)

## 2023-05-10 LAB — HEMOGLOBIN A1C: Hgb A1c MFr Bld: 8 % — ABNORMAL HIGH (ref 4.6–6.5)

## 2023-05-10 LAB — VITAMIN B12: Vitamin B-12: 283 pg/mL (ref 211–911)

## 2023-05-10 LAB — TSH: TSH: 1.2 u[IU]/mL (ref 0.35–5.50)

## 2023-05-10 LAB — VITAMIN D 25 HYDROXY (VIT D DEFICIENCY, FRACTURES): VITD: 48.09 ng/mL (ref 30.00–100.00)

## 2023-05-10 NOTE — Assessment & Plan Note (Signed)
Blood pressure well controlled, today's reading 110/70. Patient on olmesartan-amlodipine-hydrochlorothiazide 40-10-12.5mg  daily, spironolactone 50mg  daily, and carvedilol 25mg  daily. -Continue current antihypertensive regimen. -Check kidney function.

## 2023-05-10 NOTE — Assessment & Plan Note (Addendum)
Patient on atorvastatin 20mg  daily. -Continue atorvastatin 20mg  daily. -Continue Zetia 10 mg daily

## 2023-05-10 NOTE — Patient Instructions (Addendum)
It was a pleasure meeting you today. Thank you for allowing me to take part in your health care.  Our goals for today as we discussed include:  We will get some labs today.  If they are abnormal or we need to do something about them, I will call you.  If they are normal, I will send you a message on MyChart (if it is active) or a letter in the mail.  If you don't hear from [place] in 2 weeks, please call the office at the number below.   Flu vaccine today  Follow up with Endocrinology  Don't Sleep More, Sleep BETTER  Choose any or all of the following ways to help improve the quality of your sleep Go to sleep and wake up at the same time every day Use your bed for only sleep and sexual activity Bedtime routine Avoid moderate to vigorous exercise at least 2-3 hours before bedtime Avoid caffeine (tea, coffee, chocolate, soft drinks) at least 4 hours before bedtime. Refrain from drinking alcohol or smoking at least 3-4 hours before bedtime Do not take unprescribed over the counter sleeping pills. Avoid napping.  If you must nap, limit time to 20-30 minutes Make your sleeping environment comfortable and relaxing (avoid too much light, disturbing noises, using electronics 30 min before bed, keep temperature cooler) Avoid large meals or spicy food 2-3 hours before bed Talk to doctor if you still have trouble sleeping.   This is a list of the screening recommended for you and due dates:  Health Maintenance  Topic Date Due   HIV Screening  Never done   Yearly kidney function blood test for diabetes  01/07/2023   Yearly kidney health urinalysis for diabetes  01/07/2023   Complete foot exam   01/07/2023   Flu Shot  01/28/2023   Hemoglobin A1C  04/21/2023   Eye exam for diabetics  11/17/2023   DTaP/Tdap/Td vaccine (2 - Td or Tdap) 12/04/2029   Colon Cancer Screening  10/23/2030   Hepatitis C Screening  Completed   Zoster (Shingles) Vaccine  Completed   HPV Vaccine  Aged Out   COVID-19 Vaccine   Discontinued    Follow up as needed  If you have any questions or concerns, please do not hesitate to call the office at [phone].  I look forward to our next visit and until then take care and stay safe.  Regards,   Dana Allan, MD   [facility]

## 2023-05-10 NOTE — Assessment & Plan Note (Signed)
Patient on insulin pump with a maximum of 80 units/day and metformin-dapagliflozin combination. Blood sugars range from normal to 200s, no hypoglycemia reported. -Check HbA1c and urine today. -Continue current insulin and oral medication regimen.

## 2023-05-10 NOTE — Assessment & Plan Note (Signed)
History of Sleep Apnea Patient reports difficulty maintaining sleep, history of sleep apnea but not currently using CPAP due to claustrophobia. -Advise on sleep hygiene and trial of over-the-counter melatonin. -Consider referral to pulmonology for sleep study if sleep issues persist.

## 2023-05-10 NOTE — Progress Notes (Signed)
SUBJECTIVE:   Chief Complaint  Patient presents with   Hypertension    6 month follow up    HPI Presents for chronic disease management follow up   Discussed the use of AI scribe software for clinical note transcription with the patient, who gave verbal consent to proceed.  History of Present Illness The patient, with a history of diabetes, hypertension, and hyperlipidemia, presents for a follow-up visit. They report no new health issues since the last visit. The patient is currently on insulin via a pump, with a maximum of 80 units per day, and metformin. They report blood sugars are typically normal or occasionally elevated to the 200s, depending on dietary intake. They deny episodes of hypoglycemia.  The patient also reports difficulty maintaining sleep. They can fall asleep easily but struggle to return to sleep once awakened. They estimate they get approximately six hours of sleep per night. They also mention a history of sleep apnea and previous use of a CPAP machine, which they discontinued due to claustrophobia.  The patient has recently started a new job, which involves significant travel and staying in hotels. They report no chest pain or shortness of breath. They mention occasional neck pain, which has been manageable. They also report regular bowel movements, although they note that metformin sometimes affects their bowel habits.  The patient is currently on a regimen of Aldactone, olmesartan, amlodipine, hydrochlorothiazide, carvedilol, and atorvastatin. They also take potassium and vitamin D supplements, although they report they have run out of both. They have been vaccinated against pneumonia and are due to receive the flu vaccine.   PERTINENT PMH / PSH: As above  OBJECTIVE:  BP 110/70   Pulse 82   Temp 98 F (36.7 C)   Resp 16   Ht 6' (1.829 m)   Wt 185 lb (83.9 kg)   SpO2 99%   BMI 25.09 kg/m    Physical Exam Vitals reviewed.  HENT:     Head:  Normocephalic.     Right Ear: Tympanic membrane, ear canal and external ear normal.     Left Ear: Tympanic membrane, ear canal and external ear normal.     Nose: Nose normal.     Mouth/Throat:     Mouth: Mucous membranes are moist.  Eyes:     Conjunctiva/sclera: Conjunctivae normal.     Pupils: Pupils are equal, round, and reactive to light.  Neck:     Thyroid: No thyromegaly or thyroid tenderness.     Vascular: No carotid bruit.  Cardiovascular:     Rate and Rhythm: Normal rate and regular rhythm.     Pulses: Normal pulses.     Heart sounds: Normal heart sounds.  Pulmonary:     Effort: Pulmonary effort is normal.     Breath sounds: Normal breath sounds.  Abdominal:     General: Abdomen is flat. Bowel sounds are normal.     Palpations: Abdomen is soft.  Musculoskeletal:        General: Normal range of motion.     Cervical back: Normal range of motion and neck supple.     Right lower leg: No edema.     Left lower leg: No edema.  Lymphadenopathy:     Cervical: No cervical adenopathy.  Neurological:     Mental Status: He is alert.  Psychiatric:        Mood and Affect: Mood normal.        Behavior: Behavior normal.  Thought Content: Thought content normal.        Judgment: Judgment normal.        05/10/2023    8:14 AM 11/04/2022    2:25 PM 01/06/2022    3:09 PM 05/08/2021    8:20 AM 12/05/2019   11:14 AM  Depression screen PHQ 2/9  Decreased Interest 0 0 0 0 0  Down, Depressed, Hopeless 0 1 0 0 0  PHQ - 2 Score 0 1 0 0 0  Altered sleeping 3 1     Tired, decreased energy 0 0     Change in appetite 0 0     Feeling bad or failure about yourself  0 1     Trouble concentrating 0 1     Moving slowly or fidgety/restless 0 0     Suicidal thoughts 0 0     PHQ-9 Score 3 4     Difficult doing work/chores Not difficult at all          05/10/2023    8:15 AM 11/04/2022    2:25 PM 08/01/2019   11:15 AM  GAD 7 : Generalized Anxiety Score  Nervous, Anxious, on Edge 0 0 0   Control/stop worrying 0 0 0  Worry too much - different things 0 1 0  Trouble relaxing 0 1 0  Restless 0 0 0  Easily annoyed or irritable 0 0 0  Afraid - awful might happen 0 0 0  Total GAD 7 Score 0 2 0  Anxiety Difficulty Not difficult at all Not difficult at all Not difficult at all    ASSESSMENT/PLAN:  Hypertension associated with diabetes [facility]) Assessment & Plan: Blood pressure well controlled, today's reading 110/70. Patient on olmesartan-amlodipine-hydrochlorothiazide 40-10-12.5mg  daily, spironolactone 50mg  daily, and carvedilol 25mg  daily. -Continue current antihypertensive regimen. -Check kidney function.   Hyperlipidemia associated with type 2 diabetes mellitus (HCC) Assessment & Plan: Patient on atorvastatin 20mg  daily. -Continue atorvastatin 20mg  daily. -Continue Zetia 10 mg daily  Orders: -     Lipid panel  Aortic atherosclerosis (HCC) Assessment & Plan: Chronic.  Noted on CT abdomen 04/2017. Currently on statin therapy and tolerating well. Continue atorvastatin 20 mg daily. Continue Zetia 10 mg daily    Type 2 diabetes mellitus with complications (HCC) Assessment & Plan: Patient on insulin pump with a maximum of 80 units/day and metformin-dapagliflozin combination. Blood sugars range from normal to 200s, no hypoglycemia reported. -Check HbA1c and urine today. -Continue current insulin and oral medication regimen.  Orders: -     Hemoglobin A1c -     Microalbumin / creatinine urine ratio -     Vitamin B12  Vitamin D deficiency Assessment & Plan: Patient out of monthly Vitamin D supplement. -Check Vitamin D levels today to determine need for continued supplementation.  Orders: -     VITAMIN D 25 Hydroxy (Vit-D Deficiency, Fractures)  Cervicalgia  Bilateral thoracic back pain, unspecified chronicity  Encounter for screening for HIV -     HIV Antibody (routine testing w rflx)  Hypokalemia -     Comprehensive metabolic panel  Hepatic  steatosis -     Comprehensive metabolic panel  Need for influenza vaccination -     Flu vaccine trivalent PF, 6mos and older(Flulaval,Afluria,Fluarix,Fluzone)  Other fatigue Assessment & Plan: History of Sleep Apnea Patient reports difficulty maintaining sleep, history of sleep apnea but not currently using CPAP due to claustrophobia. -Advise on sleep hygiene and trial of over-the-counter melatonin. -Consider referral to pulmonology for sleep study  if sleep issues persist.  Orders: -     TSH -     Vitamin B12 -     CBC    PDMP reviewed  Return for PCP.  Dana Allan, MD

## 2023-05-10 NOTE — Assessment & Plan Note (Signed)
Patient out of monthly Vitamin D supplement. -Check Vitamin D levels today to determine need for continued supplementation.

## 2023-05-10 NOTE — Assessment & Plan Note (Signed)
Chronic.  Noted on CT abdomen 04/2017. Currently on statin therapy and tolerating well. Continue atorvastatin 20 mg daily. Continue Zetia 10 mg daily

## 2023-05-11 LAB — HIV ANTIBODY (ROUTINE TESTING W REFLEX): HIV 1&2 Ab, 4th Generation: NONREACTIVE

## 2023-05-12 NOTE — Telephone Encounter (Signed)
error 

## 2023-05-19 ENCOUNTER — Encounter: Payer: Self-pay | Admitting: Family Medicine

## 2023-05-25 DIAGNOSIS — I152 Hypertension secondary to endocrine disorders: Secondary | ICD-10-CM | POA: Diagnosis not present

## 2023-05-25 DIAGNOSIS — E1169 Type 2 diabetes mellitus with other specified complication: Secondary | ICD-10-CM | POA: Diagnosis not present

## 2023-05-25 DIAGNOSIS — E119 Type 2 diabetes mellitus without complications: Secondary | ICD-10-CM | POA: Diagnosis not present

## 2023-05-25 DIAGNOSIS — E269 Hyperaldosteronism, unspecified: Secondary | ICD-10-CM | POA: Diagnosis not present

## 2023-09-29 ENCOUNTER — Other Ambulatory Visit: Payer: Self-pay | Admitting: Family Medicine

## 2023-09-29 ENCOUNTER — Encounter: Payer: Self-pay | Admitting: Family Medicine

## 2023-09-29 DIAGNOSIS — I10 Essential (primary) hypertension: Secondary | ICD-10-CM

## 2023-10-13 ENCOUNTER — Other Ambulatory Visit: Payer: Self-pay | Admitting: Family Medicine

## 2023-10-13 DIAGNOSIS — E1159 Type 2 diabetes mellitus with other circulatory complications: Secondary | ICD-10-CM

## 2023-10-13 MED ORDER — CARVEDILOL 25 MG PO TABS
25.0000 mg | ORAL_TABLET | Freq: Every day | ORAL | Status: AC
Start: 2023-10-13 — End: ?

## 2023-10-13 MED ORDER — SPIRONOLACTONE 50 MG PO TABS
ORAL_TABLET | ORAL | 3 refills | Status: AC
Start: 2023-10-13 — End: ?

## 2023-10-13 MED ORDER — OLMESARTAN-AMLODIPINE-HCTZ 40-10-12.5 MG PO TABS
1.0000 | ORAL_TABLET | Freq: Every morning | ORAL | 3 refills | Status: AC
Start: 2023-10-13 — End: ?

## 2023-11-10 NOTE — Telephone Encounter (Signed)
 E2C2 - when patient calls back, please assist him in scheduling a transfer of care appointment with Dr. Jacklin Mascot.

## 2024-01-12 DIAGNOSIS — I152 Hypertension secondary to endocrine disorders: Secondary | ICD-10-CM | POA: Diagnosis not present

## 2024-01-12 DIAGNOSIS — E269 Hyperaldosteronism, unspecified: Secondary | ICD-10-CM | POA: Diagnosis not present

## 2024-01-12 DIAGNOSIS — E119 Type 2 diabetes mellitus without complications: Secondary | ICD-10-CM | POA: Diagnosis not present

## 2024-01-12 DIAGNOSIS — E1169 Type 2 diabetes mellitus with other specified complication: Secondary | ICD-10-CM | POA: Diagnosis not present

## 2024-03-20 ENCOUNTER — Telehealth: Admitting: Family Medicine

## 2024-03-20 DIAGNOSIS — J351 Hypertrophy of tonsils: Secondary | ICD-10-CM

## 2024-03-20 NOTE — Progress Notes (Signed)
 [place]   Patient is in [place] , and outside [place]. Advised him to go be seen in person.  Patient acknowledged agreement and understanding of the plan.

## 2024-05-24 DIAGNOSIS — Z1331 Encounter for screening for depression: Secondary | ICD-10-CM | POA: Diagnosis not present

## 2024-05-24 DIAGNOSIS — R0681 Apnea, not elsewhere classified: Secondary | ICD-10-CM | POA: Diagnosis not present

## 2024-05-24 DIAGNOSIS — Z0000 Encounter for general adult medical examination without abnormal findings: Secondary | ICD-10-CM | POA: Diagnosis not present

## 2024-05-24 DIAGNOSIS — R5383 Other fatigue: Secondary | ICD-10-CM | POA: Diagnosis not present

## 2024-05-24 DIAGNOSIS — R0683 Snoring: Secondary | ICD-10-CM | POA: Diagnosis not present

## 2024-06-02 DIAGNOSIS — Z0000 Encounter for general adult medical examination without abnormal findings: Secondary | ICD-10-CM | POA: Diagnosis not present

## 2024-06-02 DIAGNOSIS — E1159 Type 2 diabetes mellitus with other circulatory complications: Secondary | ICD-10-CM | POA: Diagnosis not present

## 2024-06-02 DIAGNOSIS — I152 Hypertension secondary to endocrine disorders: Secondary | ICD-10-CM | POA: Diagnosis not present

## 2024-06-02 DIAGNOSIS — E1169 Type 2 diabetes mellitus with other specified complication: Secondary | ICD-10-CM | POA: Diagnosis not present

## 2024-06-02 DIAGNOSIS — E559 Vitamin D deficiency, unspecified: Secondary | ICD-10-CM | POA: Diagnosis not present

## 2024-06-02 DIAGNOSIS — R5383 Other fatigue: Secondary | ICD-10-CM | POA: Diagnosis not present

## 2024-06-02 DIAGNOSIS — E785 Hyperlipidemia, unspecified: Secondary | ICD-10-CM | POA: Diagnosis not present

## 2024-06-20 DIAGNOSIS — R748 Abnormal levels of other serum enzymes: Secondary | ICD-10-CM | POA: Diagnosis not present

## 2024-06-20 DIAGNOSIS — M5030 Other cervical disc degeneration, unspecified cervical region: Secondary | ICD-10-CM | POA: Diagnosis not present

## 2024-06-27 DIAGNOSIS — I714 Abdominal aortic aneurysm, without rupture, unspecified: Secondary | ICD-10-CM | POA: Diagnosis not present

## 2024-07-18 ENCOUNTER — Telehealth: Payer: Self-pay

## 2024-07-18 NOTE — Telephone Encounter (Signed)
 We received a request for a refill via fax and I noticed patient did not have a transfer of care appointment scheduled, since Dr. Glenys Ferrari is no longer here.  I spoke with patient and he states he has a new provider with [facility], Mason Minor, [place].  I entered [employer], [place], as patient's new PCP.
